# Patient Record
Sex: Male | Born: 1935 | Race: White | Hispanic: No | Marital: Married | Smoking: Former smoker
Health system: Southern US, Community
[De-identification: ages and names within clinical notes are randomized; demographics above are authoritative.]

## PROBLEM LIST (undated history)

## (undated) DIAGNOSIS — K5730 Diverticulosis of large intestine without perforation or abscess without bleeding: Secondary | ICD-10-CM

## (undated) DIAGNOSIS — Z8619 Personal history of other infectious and parasitic diseases: Secondary | ICD-10-CM

## (undated) DIAGNOSIS — I219 Acute myocardial infarction, unspecified: Secondary | ICD-10-CM

## (undated) DIAGNOSIS — I714 Abdominal aortic aneurysm, without rupture, unspecified: Secondary | ICD-10-CM

## (undated) DIAGNOSIS — I509 Heart failure, unspecified: Secondary | ICD-10-CM

## (undated) DIAGNOSIS — I519 Heart disease, unspecified: Secondary | ICD-10-CM

## (undated) DIAGNOSIS — I2510 Atherosclerotic heart disease of native coronary artery without angina pectoris: Secondary | ICD-10-CM

## (undated) DIAGNOSIS — J439 Emphysema, unspecified: Secondary | ICD-10-CM

## (undated) DIAGNOSIS — N400 Enlarged prostate without lower urinary tract symptoms: Secondary | ICD-10-CM

## (undated) DIAGNOSIS — J8410 Pulmonary fibrosis, unspecified: Secondary | ICD-10-CM

## (undated) DIAGNOSIS — R300 Dysuria: Secondary | ICD-10-CM

## (undated) DIAGNOSIS — E785 Hyperlipidemia, unspecified: Secondary | ICD-10-CM

## (undated) DIAGNOSIS — D039 Melanoma in situ, unspecified: Secondary | ICD-10-CM

## (undated) DIAGNOSIS — I10 Essential (primary) hypertension: Secondary | ICD-10-CM

## (undated) HISTORY — DX: Personal history of other infectious and parasitic diseases: Z86.19

## (undated) HISTORY — DX: Benign prostatic hyperplasia without lower urinary tract symptoms: N40.0

## (undated) HISTORY — DX: Melanoma in situ, unspecified: D03.9

## (undated) HISTORY — DX: Emphysema, unspecified: J43.9

## (undated) HISTORY — DX: Abdominal aortic aneurysm, without rupture: I71.4

## (undated) HISTORY — DX: Dysuria: R30.0

## (undated) HISTORY — DX: Acute myocardial infarction, unspecified: I21.9

## (undated) HISTORY — DX: Heart disease, unspecified: I51.9

## (undated) HISTORY — DX: Hyperlipidemia, unspecified: E78.5

## (undated) HISTORY — DX: Abdominal aortic aneurysm, without rupture, unspecified: I71.40

## (undated) HISTORY — DX: Diverticulosis of large intestine without perforation or abscess without bleeding: K57.30

## (undated) HISTORY — DX: Essential (primary) hypertension: I10

## (undated) HISTORY — DX: Heart failure, unspecified: I50.9

## (undated) HISTORY — DX: Atherosclerotic heart disease of native coronary artery without angina pectoris: I25.10

---

## 1989-12-23 HISTORY — PX: CORONARY ARTERY BYPASS GRAFT: SHX141

## 1995-12-24 HISTORY — PX: OTHER SURGICAL HISTORY: SHX169

## 2006-12-23 HISTORY — PX: OTHER SURGICAL HISTORY: SHX169

## 2007-04-05 ENCOUNTER — Encounter: Payer: Self-pay | Admitting: Family Medicine

## 2008-12-22 ENCOUNTER — Encounter: Payer: Self-pay | Admitting: Family Medicine

## 2009-06-27 ENCOUNTER — Encounter: Payer: Self-pay | Admitting: Family Medicine

## 2009-09-01 ENCOUNTER — Encounter: Payer: Self-pay | Admitting: Family Medicine

## 2009-12-23 HISTORY — PX: HERNIA REPAIR: SHX51

## 2009-12-23 HISTORY — PX: OTHER SURGICAL HISTORY: SHX169

## 2010-03-14 ENCOUNTER — Ambulatory Visit: Payer: Self-pay | Admitting: Ophthalmology

## 2010-05-09 ENCOUNTER — Ambulatory Visit: Payer: Self-pay | Admitting: Ophthalmology

## 2010-07-13 ENCOUNTER — Ambulatory Visit: Payer: Self-pay | Admitting: Family Medicine

## 2010-07-13 DIAGNOSIS — E785 Hyperlipidemia, unspecified: Secondary | ICD-10-CM

## 2010-07-13 DIAGNOSIS — I714 Abdominal aortic aneurysm, without rupture, unspecified: Secondary | ICD-10-CM | POA: Insufficient documentation

## 2010-07-13 DIAGNOSIS — I110 Hypertensive heart disease with heart failure: Secondary | ICD-10-CM

## 2010-07-15 ENCOUNTER — Encounter: Payer: Self-pay | Admitting: Family Medicine

## 2010-07-18 DIAGNOSIS — Z87898 Personal history of other specified conditions: Secondary | ICD-10-CM

## 2010-07-18 DIAGNOSIS — I519 Heart disease, unspecified: Secondary | ICD-10-CM

## 2010-07-18 DIAGNOSIS — Z9189 Other specified personal risk factors, not elsewhere classified: Secondary | ICD-10-CM | POA: Insufficient documentation

## 2010-07-19 ENCOUNTER — Encounter: Payer: Self-pay | Admitting: Family Medicine

## 2010-09-20 ENCOUNTER — Encounter: Payer: Self-pay | Admitting: Family Medicine

## 2010-12-06 ENCOUNTER — Encounter: Payer: Self-pay | Admitting: Family Medicine

## 2011-01-22 NOTE — Letter (Signed)
Summary: [facility]  [facility]   Imported By: Lanelle Bal 07/25/2010 08:24:19  _____________________________________________________________________  External Attachment:    Type:   Image     Comment:   External Document

## 2011-01-22 NOTE — Miscellaneous (Signed)
  Clinical Lists Changes  Problems: Removed problem of SEIZURE DISORDER (ICD-780.39) Observations: Added new observation of PAST MED HX: BENIGN PROSTATIC HYPERTROPHY, HX OF (ICD-V13.8)  Dr. Morrie Sheldon CHICKENPOX, HX OF (ICD-V15.9) HEART DISEASE (ICD-429.9) HYPERTENSION (ICD-401.9) ABDOMINAL AORTIC ANEURYSM (ICD-441.4) Dr. Janetta Hora HYPERTENSION (ICD-401.9) HYPERLIPIDEMIA (ICD-272.4) Gout Sigmoid diverticulosis (07/19/2010 21:16) Added new observation of PAST SURG HX: bilateral cataract surgery 2011 AAA stent 2008 R hernia repair Left CEA 1997 CAGB 1991 (07/19/2010 21:16)      Past History:  Past Medical History: BENIGN PROSTATIC HYPERTROPHY, HX OF (ICD-V13.8)  Dr. Morrie Sheldon CHICKENPOX, HX OF (ICD-V15.9) HEART DISEASE (ICD-429.9) HYPERTENSION (ICD-401.9) ABDOMINAL AORTIC ANEURYSM (ICD-441.4) Dr. Janetta Hora HYPERTENSION (ICD-401.9) HYPERLIPIDEMIA (ICD-272.4) Gout Sigmoid diverticulosis  Past Surgical History: bilateral cataract surgery 2011 AAA stent 2008 R hernia repair Left CEA 1997 CAGB 1991     Allergies: No Known Drug Allergies

## 2011-01-22 NOTE — Letter (Signed)
Summary: [facility]  [facility]   Imported By: Maryln Gottron 10/10/2010 15:23:31  _____________________________________________________________________  External Attachment:    Type:   Image     Comment:   External Document  Appended Document: [facility]    Clinical Lists Changes  Observations: Added new observation of PAST MED HX: BENIGN PROSTATIC HYPERTROPHY, HX OF (ICD-V13.8)  Dr. Morrie Sheldon CHICKENPOX, HX OF (ICD-V15.9) HEART DISEASE (ICD-429.9) HYPERTENSION (ICD-401.9) ABDOMINAL AORTIC ANEURYSM (ICD-441.4)  with endoleak per Dr. Janetta Hora HYPERTENSION (ICD-401.9) HYPERLIPIDEMIA (ICD-272.4) Gout Sigmoid diverticulosis (10/10/2010 23:33)       Past History:  Past Medical History: BENIGN PROSTATIC HYPERTROPHY, HX OF (ICD-V13.8)  Dr. Morrie Sheldon CHICKENPOX, HX OF (ICD-V15.9) HEART DISEASE (ICD-429.9) HYPERTENSION (ICD-401.9) ABDOMINAL AORTIC ANEURYSM (ICD-441.4)  with endoleak per Dr. Janetta Hora HYPERTENSION (ICD-401.9) HYPERLIPIDEMIA (ICD-272.4) Gout Sigmoid diverticulosis

## 2011-01-22 NOTE — Miscellaneous (Signed)
  Clinical Lists Changes  Observations: Added new observation of PAST SURG HX: bilateral cataract surgery 2011 AAA stent 2008 R hernia repair (07/15/2010 23:23)      Past History:  Past Surgical History: bilateral cataract surgery 2011 AAA stent 2008 R hernia repair

## 2011-01-22 NOTE — Letter (Signed)
Summary: [facility]  [facility]   Imported By: Lanelle Bal 07/23/2010 09:48:33  _____________________________________________________________________  External Attachment:    Type:   Image     Comment:   External Document

## 2011-01-22 NOTE — Assessment & Plan Note (Signed)
Summary: NEW MEDICARE PT   Vital Signs:  Patient profile:   77 year old male Height:      66 inches Weight:      188 pounds BMI:     30.45 Temp:     97.9 degrees F oral Pulse rate:   68 / minute Pulse rhythm:   regular BP sitting:   122 / 58  (left arm) Cuff size:   regular  Vitals Entered By: Delilah Shan CMA Duncan Dull) (July 13, 2010 2:02 PM) CC: New Patient to Establish  (Medicare)   History of Present Illness: Hypertension:      Using medication without problems or lightheadedness: yes Chest pain with exertion:no Edema:no Short of breath:no Average BPs: controlled. Other issues: followed at [facility] clinic Needs to establish local care as most items have been managed by [facility].  Plans to continue to follow up there for meds and labs.   Preventive Screening-Counseling & Management  Alcohol-Tobacco     Smoking Status: quit  Caffeine-Diet-Exercise     Does Patient Exercise: no      Drug Use:  no.    Current Problems (verified): 1)  Hypertension  (ICD-401.9) 2)  Abdominal Aortic Aneurysm  (ICD-441.4) 3)  Seizure Disorder  (ICD-780.39) 4)  Hypertension  (ICD-401.9) 5)  Hyperlipidemia  (ICD-272.4)  Current Medications (verified): 1)  Diltiazem Hcl Er Beads 300 Mg Xr24h-Cap (Diltiazem Hcl Er Beads) .... Take 1 Tablet By Mouth Once A Day 2)  Triamterene-Hctz 37.5-25 Mg Tabs (Triamterene-Hctz) .... Take 1 Tablet By Mouth Every Morning 3)  Aspirin 81 Mg  Tabs (Aspirin) .... Take 1 Tablet By Mouth Every Morning 4)  Vitamin C 500 Mg  Tabs (Ascorbic Acid) .... Take 1 Tablet By Mouth Every Morning 5)  Vitamin E 600 Unit  Caps (Vitamin E) .... 400 International Units Take 1 Tablet By Mouth Every Morning 6)  Simvastatin 20 Mg Tabs (Simvastatin) .... Take 1 Tab By Mouth At Bedtime 7)  Avodart 0.5 Mg Caps (Dutasteride) .... Take 1 Tab By Mouth At Bedtime 8)  Allopurinol 300 Mg Tabs (Allopurinol) .... Take 1 Tab By Mouth At Bedtime 9)  Doxazosin Mesylate 8 Mg Tabs (Doxazosin Mesylate) ....  Take 1 Tab By Mouth At Bedtime 10)  Docusate Sodium 100 Mg Caps (Docusate Sodium) .... 200 Mg. Take 1 Tab By Mouth At Bedtime  Allergies (verified): No Known Drug Allergies  Past History:  Past Medical History: ABDOMINAL AORTIC ANEURYSM (ICD-441.4) Dr. Janetta Hora HYPERTENSION (ICD-401.9) HYPERLIPIDEMIA (ICD-272.4) BPH- prev seen by Dr. Morrie Sheldon    Past Surgical History: bilateral cataract surgery 2011 AAA stent 2008  Family History: Father: Heart Disease, sudden death, age 73 Mother: Stroke Siblings: none  Social History: Marital Status: Married since 1951 Children: [employer] Occupation: Retired Former Smoker Alcohol use-yes Drug use-no Regular exercise-no 3 children, 1 died of breast CA, another had breast CA Enjoyed travelling with RV previously Smoking Status:  quit Drug Use:  no Does Patient Exercise:  no  Review of Systems       See HPI.  Otherwise noncontributory.  "I'm feeling good."  Physical Exam  General:  GEN: nad, alert and oriented HEENT: mucous membranes moist NECK: supple w/o LA CV: rrr. PULM: ctab, no inc wob ABD: soft, +bs, no bruit, no pulsatile mass EXT: trace edema SKIN: no acute rash    Impression & Recommendations:  Problem # 1:  HYPERTENSION (ICD-401.9) No change in meds.  BP controlled.  will request records and update chart.  He has follow up  for AAA.  No surgery planned at this point.  His updated medication list for this problem includes:    Diltiazem Hcl Er Beads 300 Mg Xr24h-cap (Diltiazem hcl er beads) .Marland Kitchen... Take 1 tablet by mouth once a day    Triamterene-hctz 37.5-25 Mg Tabs (Triamterene-hctz) .Marland Kitchen... Take 1 tablet by mouth every morning    Doxazosin Mesylate 8 Mg Tabs (Doxazosin mesylate) .Marland Kitchen... Take 1 tab by mouth at bedtime  Complete Medication List: 1)  Diltiazem Hcl Er Beads 300 Mg Xr24h-cap (Diltiazem hcl er beads) .... Take 1 tablet by mouth once a day 2)  Triamterene-hctz 37.5-25 Mg Tabs (Triamterene-hctz) .... Take 1 tablet by  mouth every morning 3)  Aspirin 81 Mg Tabs (Aspirin) .... Take 1 tablet by mouth every morning 4)  Vitamin C 500 Mg Tabs (Ascorbic acid) .... Take 1 tablet by mouth every morning 5)  Vitamin E 600 Unit Caps (Vitamin e) .... 400 international units take 1 tablet by mouth every morning 6)  Simvastatin 20 Mg Tabs (Simvastatin) .... Take 1 tab by mouth at bedtime 7)  Avodart 0.5 Mg Caps (Dutasteride) .... Take 1 tab by mouth at bedtime 8)  Allopurinol 300 Mg Tabs (Allopurinol) .... Take 1 tab by mouth at bedtime 9)  Doxazosin Mesylate 8 Mg Tabs (Doxazosin mesylate) .... Take 1 tab by mouth at bedtime 10)  Docusate Sodium 100 Mg Caps (Docusate sodium) .... 200 mg. take 1 tab by mouth at bedtime  Patient Instructions: 1)  Don't change your meds.  We'll get your records.   2)  Please schedule a follow-up appointment as needed .   Current Allergies (reviewed today): No known allergies

## 2011-01-24 NOTE — Letter (Signed)
Summary: [facility]  [facility]   Imported By: Lanelle Bal 12/20/2010 07:56:22  _____________________________________________________________________  External Attachment:    Type:   Image     Comment:   External Document  Appended Document: [facility]    Clinical Lists Changes  Observations: Added new observation of PAST MED HX: BENIGN PROSTATIC HYPERTROPHY, HX OF (ICD-V13.8)  Dr. Morrie Sheldon CHICKENPOX, HX OF (ICD-V15.9) HEART DISEASE (ICD-429.9) HYPERTENSION (ICD-401.9) ABDOMINAL AORTIC ANEURYSM (ICD-441.4)  with endoleak per Dr. Janetta Hora, aneurysmal sac size stable as of 12/11 (7.5x6.1cm) HYPERTENSION (ICD-401.9) HYPERLIPIDEMIA (ICD-272.4) Gout Sigmoid diverticulosis (12/20/2010 8:11)       Past History:  Past Medical History: BENIGN PROSTATIC HYPERTROPHY, HX OF (ICD-V13.8)  Dr. Morrie Sheldon CHICKENPOX, HX OF (ICD-V15.9) HEART DISEASE (ICD-429.9) HYPERTENSION (ICD-401.9) ABDOMINAL AORTIC ANEURYSM (ICD-441.4)  with endoleak per Dr. Janetta Hora, aneurysmal sac size stable as of 12/11 (7.5x6.1cm) HYPERTENSION (ICD-401.9) HYPERLIPIDEMIA (ICD-272.4) Gout Sigmoid diverticulosis

## 2011-01-25 ENCOUNTER — Encounter: Payer: Self-pay | Admitting: Family Medicine

## 2011-01-25 ENCOUNTER — Ambulatory Visit (INDEPENDENT_AMBULATORY_CARE_PROVIDER_SITE_OTHER): Payer: Medicare Other | Admitting: Family Medicine

## 2011-01-25 DIAGNOSIS — R300 Dysuria: Secondary | ICD-10-CM | POA: Insufficient documentation

## 2011-01-25 LAB — CONVERTED CEMR LAB
Nitrite: NEGATIVE
Protein, U semiquant: >=300
WBC Urine, dipstick: NEGATIVE
pH: 7

## 2011-01-30 NOTE — Assessment & Plan Note (Signed)
Summary: ?UTI/CLE    [employer]   Vital Signs:  Patient profile:   77 year old male Height:      66 inches Weight:      186 pounds BMI:     30.13 Temp:     98 degrees F oral Pulse rate:   88 / minute Pulse rhythm:   regular BP sitting:   132 / 60  (left arm) Cuff size:   large  Vitals Entered By: Delilah Shan CMA Caedin Mogan Dull) (January 25, 2011 9:51 AM) CC: ? UTI  - urinating blood   History of Present Illness: Pain with urination since Monday with loss of bladder control.  No fevers.  No symptoms like this previously.  No change in meds.  Has had lower abdominal pain. No blood in stool. No hemoptysis and no bruising.  Burning with urination.  No pain now.  Allergies: No Known Drug Allergies  Review of Systems       See HPI.  Otherwise negative.    Physical Exam  General:  no apparent distress normocephalic atraumatic mucous membranes moist regular rate and rhythm clear to auscultation bilaterally abdomen soft, not tender to palpation, no cva pain, no suprapubic pain rectal w/o gross blood, prostate enlarged but not tender to palpation  ext genitalia: uncircumcised, appears to have fungal elements on the glans.  urethral meatus is slightly irriated.  shaft not tender to palpation    Impression & Recommendations:  Problem # 1:  DYSURIA (ICD-788.1) Treat as presumed cystitis and not prostatis with concurrent superficial fungal infection on the glans.  he has used lotrasone before and this was the only med that helped the patient.  Start cipro and check cx.  Fu as needed.  He agrees.  Nontoxic.  I don't suspect any relation to his vascular disease.  Orders: Prescription Created Electronically [id number]) Specimen Handling ([id number]) T-Culture, Urine ([id number])  His updated medication list for this problem includes:    Cipro 500 Mg Tabs (Ciprofloxacin hcl) .Marland Kitchen... 1 by mouth two times a day x10days  Complete Medication List: 1)  Diltiazem Hcl Er Beads 300 Mg Xr24h-cap (Diltiazem hcl  er beads) .... Take 1 tablet by mouth once a day 2)  Triamterene-hctz 37.5-25 Mg Tabs (Triamterene-hctz) .... Take 1 tablet by mouth every morning 3)  Aspirin 81 Mg Tabs (Aspirin) .... Take 1 tablet by mouth every morning 4)  Vitamin C 500 Mg Tabs (Ascorbic acid) .... Take 1 tablet by mouth every morning 5)  Vitamin E 600 Unit Caps (Vitamin e) .... 400 international units take 1 tablet by mouth every morning 6)  Simvastatin 20 Mg Tabs (Simvastatin) .... Take 1 tab by mouth at bedtime 7)  Avodart 0.5 Mg Caps (Dutasteride) .... Take 1 tab by mouth at bedtime 8)  Allopurinol 300 Mg Tabs (Allopurinol) .... Take 1 tab by mouth at bedtime 9)  Doxazosin Mesylate 8 Mg Tabs (Doxazosin mesylate) .... Take 1 tab by mouth at bedtime 10)  Docusate Sodium 100 Mg Caps (Docusate sodium) .... 200 mg. take 1 tab by mouth at bedtime 11)  Cipro 500 Mg Tabs (Ciprofloxacin hcl) .Marland Kitchen.. 1 by mouth two times a day x10days 12)  Lotrisone 1-0.05 % Crea (Clotrimazole-betamethasone) .... Apply to affected area two times a day as needed.  Other Orders: UA Dipstick w/o Micro (manual) (29562)  Patient Instructions: 1)  Start the antibiotics today and use the cream as needed.  We'll contact you with your lab report.  If your symptoms continue, let us  know.  It looks like you have a bladder infection and not a problem related to the blood vessels.  Take care.  Prescriptions: LOTRISONE 1-0.05 % CREA (CLOTRIMAZOLE-BETAMETHASONE) apply to affected area two times a day as needed.  #30g x 1   Entered and Authorized by:   Crawford Givens MD   Signed by:   Crawford Givens MD on 01/25/2011   Method used:   Electronically to        [facility]  [address] (retail)       [address]       [place], [place]  [address]       Ph: [phone] or [phone]       Fax: [phone]   RxID:   [id number] CIPRO 500 MG TABS (CIPROFLOXACIN HCL) 1 by mouth two times a day x10days  #20 x 0   Entered and Authorized by:   Crawford Givens  MD   Signed by:   Crawford Givens MD on 01/25/2011   Method used:   [facility]        [facility]  [id number] (retail[facility]       [address]       [place], [place]  [address]       Ph: [phone] or [phone]       Fax: [phone]   RxID:   [id number]    Orders Added: 1)  UA Dipstick w/o Micro (manual) [81002] 2)  Prescription Created Electronically [G8553] 3)  Est. Patient Level III [70623] 4)  Specimen Handling [99000] 5)  T-Culture, Urine [[id number]]    Current Allergies (reviewed today): No known allergies   Laboratory Results   Urine Tests  Date/Time Received: January 25, 2011 9:59 AM   Routine Urinalysis   Color: red Appearance: Cloudy Glucose: negative   (Normal Range: Negative) Bilirubin: negative   (Normal Range: Negative) Ketone: negative   (Normal Range: Negative) Spec. Gravity: 1.015   (Normal Range: 1.003-1.035) Blood: large   (Normal Range: Negative) pH: 7.0   (Normal Range: 5.0-8.0) Protein: >=300   (Normal Range: Negative) Urobilinogen: 0.2   (Normal Range: 0-1) Nitrite: negative   (Normal Range: Negative) Leukocyte Esterace: negative   (Normal Range: Negative)

## 2011-04-10 LAB — LIPID PANEL
Cholesterol: 161 mg/dL (ref 0–200)
HDL: 81 mg/dL — AB (ref 35–70)
Triglycerides: 48 mg/dL (ref 40–160)

## 2011-08-01 ENCOUNTER — Encounter: Payer: Self-pay | Admitting: Family Medicine

## 2011-08-28 ENCOUNTER — Encounter: Payer: Self-pay | Admitting: Family Medicine

## 2011-08-29 ENCOUNTER — Encounter: Payer: Self-pay | Admitting: Family Medicine

## 2011-12-09 LAB — BASIC METABOLIC PANEL: Creatinine: 1.1 mg/dL (ref 0.6–1.3)

## 2011-12-09 LAB — LIPID PANEL
HDL: 72 mg/dL — AB (ref 35–70)
LDL Cholesterol: 71 mg/dL
Triglycerides: 46 mg/dL (ref 40–160)

## 2011-12-09 LAB — CBC AND DIFFERENTIAL: Hemoglobin: 14.4 g/dL (ref 13.5–17.5)

## 2012-01-27 ENCOUNTER — Encounter: Payer: Self-pay | Admitting: Family Medicine

## 2012-02-13 ENCOUNTER — Encounter: Payer: Self-pay | Admitting: Family Medicine

## 2012-05-11 ENCOUNTER — Inpatient Hospital Stay (HOSPITAL_COMMUNITY)
Admission: EM | Admit: 2012-05-11 | Discharge: 2012-05-13 | DRG: 203 | Disposition: A | Discharge status: Home / Self Care | Payer: Medicare Other | Attending: Internal Medicine | Admitting: Internal Medicine

## 2012-05-11 ENCOUNTER — Emergency Department (HOSPITAL_COMMUNITY): Payer: Medicare Other

## 2012-05-11 ENCOUNTER — Encounter (HOSPITAL_COMMUNITY): Payer: Self-pay | Admitting: *Deleted

## 2012-05-11 ENCOUNTER — Ambulatory Visit (INDEPENDENT_AMBULATORY_CARE_PROVIDER_SITE_OTHER): Payer: Medicare Other | Admitting: Family Medicine

## 2012-05-11 ENCOUNTER — Encounter: Payer: Self-pay | Admitting: Family Medicine

## 2012-05-11 VITALS — BP 144/64 | HR 87 | Temp 99.5°F | Wt 187.0 lb

## 2012-05-11 DIAGNOSIS — Z7982 Long term (current) use of aspirin: Secondary | ICD-10-CM

## 2012-05-11 DIAGNOSIS — N400 Enlarged prostate without lower urinary tract symptoms: Secondary | ICD-10-CM

## 2012-05-11 DIAGNOSIS — R0902 Hypoxemia: Secondary | ICD-10-CM | POA: Insufficient documentation

## 2012-05-11 DIAGNOSIS — M109 Gout, unspecified: Secondary | ICD-10-CM | POA: Diagnosis present

## 2012-05-11 DIAGNOSIS — N401 Enlarged prostate with lower urinary tract symptoms: Secondary | ICD-10-CM | POA: Diagnosis present

## 2012-05-11 DIAGNOSIS — J189 Pneumonia, unspecified organism: Secondary | ICD-10-CM | POA: Insufficient documentation

## 2012-05-11 DIAGNOSIS — J438 Other emphysema: Secondary | ICD-10-CM | POA: Diagnosis present

## 2012-05-11 DIAGNOSIS — R300 Dysuria: Secondary | ICD-10-CM | POA: Diagnosis present

## 2012-05-11 DIAGNOSIS — Z87891 Personal history of nicotine dependence: Secondary | ICD-10-CM

## 2012-05-11 DIAGNOSIS — J40 Bronchitis, not specified as acute or chronic: Secondary | ICD-10-CM

## 2012-05-11 DIAGNOSIS — Z87898 Personal history of other specified conditions: Secondary | ICD-10-CM | POA: Diagnosis present

## 2012-05-11 DIAGNOSIS — J45909 Unspecified asthma, uncomplicated: Secondary | ICD-10-CM | POA: Diagnosis present

## 2012-05-11 DIAGNOSIS — E785 Hyperlipidemia, unspecified: Secondary | ICD-10-CM | POA: Diagnosis present

## 2012-05-11 DIAGNOSIS — E871 Hypo-osmolality and hyponatremia: Secondary | ICD-10-CM

## 2012-05-11 DIAGNOSIS — I110 Hypertensive heart disease with heart failure: Secondary | ICD-10-CM | POA: Diagnosis present

## 2012-05-11 DIAGNOSIS — I714 Abdominal aortic aneurysm, without rupture, unspecified: Secondary | ICD-10-CM | POA: Diagnosis present

## 2012-05-11 DIAGNOSIS — I10 Essential (primary) hypertension: Secondary | ICD-10-CM | POA: Diagnosis present

## 2012-05-11 DIAGNOSIS — I25810 Atherosclerosis of coronary artery bypass graft(s) without angina pectoris: Secondary | ICD-10-CM

## 2012-05-11 DIAGNOSIS — I519 Heart disease, unspecified: Secondary | ICD-10-CM | POA: Diagnosis present

## 2012-05-11 DIAGNOSIS — Z951 Presence of aortocoronary bypass graft: Secondary | ICD-10-CM

## 2012-05-11 DIAGNOSIS — I2510 Atherosclerotic heart disease of native coronary artery without angina pectoris: Secondary | ICD-10-CM | POA: Diagnosis present

## 2012-05-11 DIAGNOSIS — K5730 Diverticulosis of large intestine without perforation or abscess without bleeding: Secondary | ICD-10-CM | POA: Diagnosis present

## 2012-05-11 DIAGNOSIS — J209 Acute bronchitis, unspecified: Principal | ICD-10-CM | POA: Diagnosis present

## 2012-05-11 DIAGNOSIS — Z79899 Other long term (current) drug therapy: Secondary | ICD-10-CM

## 2012-05-11 DIAGNOSIS — N138 Other obstructive and reflux uropathy: Secondary | ICD-10-CM | POA: Diagnosis present

## 2012-05-11 LAB — COMPREHENSIVE METABOLIC PANEL
ALT: 12 U/L (ref 0–53)
AST: 21 U/L (ref 0–37)
Albumin: 3.9 g/dL (ref 3.5–5.2)
Alkaline Phosphatase: 64 U/L (ref 39–117)
CO2: 25 mEq/L (ref 19–32)
Chloride: 99 mEq/L (ref 96–112)
GFR calc non Af Amer: 67 mL/min — ABNORMAL LOW (ref >90)
Potassium: 4.1 mEq/L (ref 3.5–5.1)
Total Bilirubin: 0.5 mg/dL (ref 0.3–1.2)

## 2012-05-11 LAB — CBC
HCT: 42.4 % (ref 39.0–52.0)
Hemoglobin: 14.2 g/dL (ref 13.0–17.0)
MCHC: 33.5 g/dL (ref 30.0–36.0)
RDW: 15 % (ref 11.5–15.5)
WBC: 12 10*3/uL — ABNORMAL HIGH (ref 4.0–10.5)

## 2012-05-11 LAB — DIFFERENTIAL
Basophils Absolute: 0 10*3/uL (ref 0.0–0.1)
Basophils Relative: 0 % (ref 0–1)
Lymphocytes Relative: 7 % — ABNORMAL LOW (ref 12–46)
Monocytes Absolute: 0.9 10*3/uL (ref 0.1–1.0)
Neutro Abs: 10 10*3/uL — ABNORMAL HIGH (ref 1.7–7.7)
Neutrophils Relative %: 83 % — ABNORMAL HIGH (ref 43–77)

## 2012-05-11 LAB — CARDIAC PANEL(CRET KIN+CKTOT+MB+TROPI): Relative Index: 0.6 (ref 0.0–2.5)

## 2012-05-11 LAB — D-DIMER, QUANTITATIVE: D-Dimer, Quant: 3.63 ug/mL-FEU — ABNORMAL HIGH (ref 0.00–0.48)

## 2012-05-11 MED ORDER — ASPIRIN 81 MG PO CHEW
81.0000 mg | CHEWABLE_TABLET | Freq: Every day | ORAL | Status: DC
  Administered 2012-05-12 – 2012-05-13 (×2): 81 mg via ORAL
  Filled 2012-05-11 (×2): qty 1

## 2012-05-11 MED ORDER — DUTASTERIDE 0.5 MG PO CAPS
0.5000 mg | ORAL_CAPSULE | Freq: Every day | ORAL | Status: DC
  Administered 2012-05-11 – 2012-05-13 (×3): 0.5 mg via ORAL
  Filled 2012-05-11 (×3): qty 1

## 2012-05-11 MED ORDER — TRIAMTERENE-HCTZ 75-50 MG PO TABS
0.5000 | ORAL_TABLET | Freq: Every day | ORAL | Status: DC
  Administered 2012-05-11 – 2012-05-12 (×2): 0.5 via ORAL
  Filled 2012-05-11 (×3): qty 0.5

## 2012-05-11 MED ORDER — LEVOFLOXACIN 750 MG PO TABS
750.0000 mg | ORAL_TABLET | ORAL | Status: DC
  Filled 2012-05-11: qty 1
  Filled 2012-05-11: qty 2

## 2012-05-11 MED ORDER — SIMVASTATIN 40 MG PO TABS: 40.0000 mg | ORAL_TABLET | Freq: Every evening | ORAL | Status: DC

## 2012-05-11 MED ORDER — DM-GUAIFENESIN ER 30-600 MG PO TB12
1.0000 | ORAL_TABLET | Freq: Two times a day (BID) | ORAL | Status: DC
  Administered 2012-05-11 – 2012-05-13 (×4): 1 via ORAL
  Filled 2012-05-11 (×5): qty 1

## 2012-05-11 MED ORDER — ASPIRIN 81 MG PO TABS: 81.0000 mg | ORAL_TABLET | Freq: Every day | ORAL | Status: DC

## 2012-05-11 MED ORDER — HEPARIN SODIUM (PORCINE) 5000 UNIT/ML IJ SOLN
5000.0000 [IU] | Freq: Three times a day (TID) | INTRAMUSCULAR | Status: DC
  Administered 2012-05-11 – 2012-05-13 (×5): 5000 [IU] via SUBCUTANEOUS
  Filled 2012-05-11 (×8): qty 1

## 2012-05-11 MED ORDER — IOHEXOL 300 MG/ML  SOLN
100.0000 mL | Freq: Once | INTRAMUSCULAR | Status: AC | PRN
  Administered 2012-05-11: 80 mL via INTRAVENOUS

## 2012-05-11 MED ORDER — ATORVASTATIN CALCIUM 20 MG PO TABS
20.0000 mg | ORAL_TABLET | Freq: Every day | ORAL | Status: DC
  Administered 2012-05-11 – 2012-05-12 (×2): 20 mg via ORAL
  Filled 2012-05-11 (×3): qty 1

## 2012-05-11 MED ORDER — ACETAMINOPHEN 500 MG PO TABS
1000.0000 mg | ORAL_TABLET | Freq: Once | ORAL | Status: AC
  Administered 2012-05-11: 1000 mg via ORAL
  Filled 2012-05-11: qty 2

## 2012-05-11 MED ORDER — DOXAZOSIN MESYLATE 8 MG PO TABS
8.0000 mg | ORAL_TABLET | Freq: Every day | ORAL | Status: DC
  Administered 2012-05-11 – 2012-05-12 (×2): 8 mg via ORAL
  Filled 2012-05-11 (×3): qty 1

## 2012-05-11 MED ORDER — ALLOPURINOL 300 MG PO TABS
300.0000 mg | ORAL_TABLET | Freq: Every day | ORAL | Status: DC
  Administered 2012-05-11 – 2012-05-13 (×3): 300 mg via ORAL
  Filled 2012-05-11 (×3): qty 1

## 2012-05-11 MED ORDER — LEVOFLOXACIN 500 MG PO TABS: 750.0000 mg | ORAL_TABLET | ORAL | Status: DC

## 2012-05-11 MED ORDER — DOCUSATE SODIUM 100 MG PO CAPS
100.0000 mg | ORAL_CAPSULE | Freq: Two times a day (BID) | ORAL | Status: DC
  Administered 2012-05-11 – 2012-05-12 (×2): 100 mg via ORAL
  Filled 2012-05-11 (×5): qty 1

## 2012-05-11 MED ORDER — MOXIFLOXACIN HCL 400 MG PO TABS
400.0000 mg | ORAL_TABLET | Freq: Once | ORAL | Status: AC
  Administered 2012-05-11: 400 mg via ORAL
  Filled 2012-05-11: qty 1

## 2012-05-11 MED ORDER — VITAMIN D3 25 MCG (1000 UNIT) PO TABS
1000.0000 [IU] | ORAL_TABLET | Freq: Every day | ORAL | Status: DC
  Administered 2012-05-11 – 2012-05-13 (×3): 1000 [IU] via ORAL
  Filled 2012-05-11 (×3): qty 1

## 2012-05-11 MED ORDER — DILTIAZEM HCL ER BEADS 300 MG PO CP24
300.0000 mg | ORAL_CAPSULE | Freq: Every day | ORAL | Status: DC
  Administered 2012-05-11 – 2012-05-13 (×3): 300 mg via ORAL
  Filled 2012-05-11 (×3): qty 1

## 2012-05-11 NOTE — Progress Notes (Signed)
PHARMACIST - PHYSICIAN COMMUNICATION  DESCRIPTION:   Patients on diltiazem and simvastatin >10mg /day have reported cases of rhabdomyolysis. Per P/T Policy, if simvastatin dose is > 10 mg, substitute atorvastatin (Lipitor) 1mg  for each 2mg  simvastatin and leave communication form advising MD of change.  Please take this into consideration at discharge.  Thank you.    If you have questions about this conversion, please contact the Pharmacy Department  []   [phone] )  [facility] []   [phone] )  [facility]  []   [phone] )  [facility] [x]   [phone] )  [facility]    Geoffry Paradise, PharmD.   Pager:  [phone] 10:05 PM

## 2012-05-11 NOTE — Progress Notes (Signed)
3 days of ST (this is some better now) and cough.  The cough is worse.  Cough is worse supine.  Sleep is disrupted.  Mild elevation in temp today, hadn't checked at home.  Had no sweats or chills.  Is sob, more than normal but he can get a good deep breath.  He can walk 20 feet and then gets winded, shorter than normal.  No wheeze.  No sputum.  Diffuse aches and pain in forehead.    PMH and SH reviewed  ROS: See HPI, otherwise noncontributory.  Meds, vitals, and allergies reviewed.   nad ncat  Tm w/o erthema OP wnl Neck supple rrr No focal dec in BS but global dec in BS w/o wheeze Abd soft  Ext w/o edema  Pulse ox initially ~90%, improved to 95% with 2L [place] and he feels less SOB

## 2012-05-11 NOTE — ED Notes (Signed)
[id number]<AV> Expected date:<BR> Expected time: 2:04 PM<BR> Means of arrival:<BR> Comments:<BR> M51 - 82yoM Chest congestion, febrile, rales

## 2012-05-11 NOTE — Patient Instructions (Signed)
To ER

## 2012-05-11 NOTE — Progress Notes (Signed)
ANTIBIOTIC CONSULT NOTE - INITIAL  Pharmacy Consult for Levaquin Indication: possible PNA  No Known Allergies  Patient Measurements:   Adjusted Body Weight:   Vital Signs: Temp: 99 F (37.2 C) (05/20 2043) Temp src: Oral (05/20 2043) BP: 134/57 mmHg (05/20 2043) Pulse Rate: 57  (05/20 2043) Intake/Output from previous day:   Intake/Output from this shift:    Labs:  Basename 05/11/12 1612  WBC 12.0*  HGB 14.2  PLT 149*  LABCREA --  CREATININE 1.01   The CrCl is unknown because both a height and weight (above a minimum accepted value) are required for this calculation. No results found for this basename: VANCOTROUGH:2,VANCOPEAK:2,VANCORANDOM:2,GENTTROUGH:2,GENTPEAK:2,GENTRANDOM:2,TOBRATROUGH:2,TOBRAPEAK:2,TOBRARND:2,AMIKACINPEAK:2,AMIKACINTROU:2,AMIKACIN:2, in the last 72 hours   Microbiology: No results found for this or any previous visit (from the past 720 hour(s)).  Medical History: Past Medical History  Diagnosis Date  . Hyperlipidemia   . Hypertension   . Abdominal aortic aneurysm     with endoleak per Dr. Janetta Hora, aneurysmal sac size stable as of 12/11 (7.5 x 6.1cm)  . Benign prostatic hypertrophy   . History of chickenpox   . Dysuria   . Heart disease   . Gout   . Sigmoid diverticulosis    Assessment:  18 YOM presented with sore throat, cough x couple days. Per MD, likely early bacterial pneumonia vs viral bronchitis.  CXR negative.  Levaquin started and pharmacy asked to manage dosing.   Scr 1.01  Patient received Moxifloxacin 400 mg tablet at 2138 tonight  Goal of Therapy:   Appropriate renal adjustment of Levaquin  Plan:   Continue Levaquin 750 mg x 5 days as ordered by MD (will reschedule to begin tomorrow 5/21)  Geoffry Paradise Thi 05/11/2012,10:06 PM

## 2012-05-11 NOTE — H&P (Signed)
History and Physical  Dustin [name] WGN:[id number] DOB: February 09, 1926 DOA: 05/11/2012  Referring physician: PCP: Crawford Givens, MD, MD   Chief Complaint: Cough  HPI:  Bad sore throat started on Firday and worsened till Sunday and coughed all ngiht until Monday morning.  Went to see Dr. Cheree Ditto his PCP today-Was sent for a cT scan of the lungs to rule out PE which was negative.  Has a terrible  CP when he rdid cough.  NO real fevers noted or documented, but was documented to 98.1 F at [facility].    No production of sputum or phlegm, no sick contacts. No h/o asthma or COPd or Allergies-no usual seasonal allergies either.  No bodyaches or pains.  apparentrly looked ashen grey to his PCP this am  Chart Review:  See below  Review of Systems:  Headache this am +-Tylenol here helped, no blurred double vision, no throat or ear congestion right now, and the sore throat seemd to have gone away-no sick kids around, no current CP,-unless he is coughin,no abd pain, +dark stool occasionally-no hemorrhopids,  No one sided weakness, No faints or dizzyness, no blood or burning in urine--has howveer been getting pain when he urinates-can empty bladdr completely  Past Medical History  Diagnosis Date  . Hyperlipidemia   . Hypertension   . Abdominal aortic aneurysm     with endoleak per Dr. Janetta Hora, aneurysmal sac size stable as of 12/11 (7.5 x 6.1cm)  . Benign prostatic hypertrophy   . History of chickenpox   . Dysuria   . Heart disease   . Gout   . Sigmoid diverticulosis   Bypass surgery when he was 78 yrs old-Triple bypass done in [place] at [facility]-Dr. Lequita Halt Stent placed into AAA and last scan in Jan showed no changes and is tosee Dr. Janetta Hora in 1 yr Colonoscopy done 10 yrs ago-no polyps or anything per pt report  Past Surgical History  Procedure Date  . Hernia repair 2011    Right  . Coronary artery bypass graft 1991  . Bilateral cataract surgery 2011  . Aaa stent 2008  . Left cea 1997     Social History:  reports that he quit smoking about 23 years ago. His smoking use included Cigarettes. He has a 40 pack-year smoking history. He does not have any smokeless tobacco history on file. He reports that he drinks alcohol. He reports that he does not use illicit drugs.  No Known Allergies  Family History  Problem Relation Age of Onset  . Stroke Mother   . Heart disease Father 40    Sudden death     Prior to Admission medications   Medication Sig Start Date End Date Taking? Authorizing Provider  allopurinol (ZYLOPRIM) 300 MG tablet Take 300 mg by mouth daily.   Yes Historical Provider, MD  aspirin 81 MG tablet Take 81 mg by mouth daily.   Yes Historical Provider, MD  cholecalciferol (VITAMIN D) 1000 UNITS tablet Take 1,000 Units by mouth daily.   Yes Historical Provider, MD  diltiazem (TIAZAC) 300 MG 24 hr capsule Take 300 mg by mouth daily.   Yes Historical Provider, MD  docusate sodium (COLACE) 100 MG capsule Take 100 mg by mouth 2 (two) times daily.   Yes Historical Provider, MD  doxazosin (CARDURA) 8 MG tablet Take 8 mg by mouth at bedtime.   Yes Historical Provider, MD  dutasteride (AVODART) 0.5 MG capsule Take 0.5 mg by mouth daily.   Yes Historical Provider, MD  simvastatin (  ZOCOR) 40 MG tablet Take 40 mg by mouth every evening.   Yes Historical Provider, MD  triamterene-hydrochlorothiazide (MAXZIDE) 75-50 MG per tablet Take 0.5 tablets by mouth daily.   Yes Historical Provider, MD  vitamin C (ASCORBIC ACID) 500 MG tablet Take 1,000 mg by mouth daily.    Yes Historical Provider, MD  vitamin E 400 UNIT capsule Take 400 Units by mouth daily.   Yes Historical Provider, MD   Physical Exam: Filed Vitals:   05/11/12 1431 05/11/12 1551 05/11/12 1826 05/11/12 2043  BP: 155/56 134/52  134/57  Pulse: 64 63  57  Temp: 99.2 F (37.3 C) 99.5 F (37.5 C)  99 F (37.2 C)  TempSrc: Oral Oral  Oral  Resp: 20 20  15   SpO2: 97% 97% 87% 94%     General:  Pleasant alert  Caucasian in no apparent distress. Patient able to verbalize in full sentences  Eyes: Postop changes to both eyes for cataracts, arcus senilis present, no icterus or pallor  ENT: Soft supple no masses no thyromegaly. No JVD  Neck: See above  Cardiovascular: S1-S2 no murmur rub or gallop  Respiratory: Clinically clear no added sound, no tactile vocal resonance and fremitus  Abdomen: Soft nontender nondistended  Skin: Trace lower extremity edema  Musculoskeletal: Moves all 4 limbs equally  Psychiatric: Euthymic  Neurologic: Grossly intact as  Labs on Admission:  Basic Metabolic Panel:  Lab 05/11/12 5621  NA 137  K 4.1  CL 99  CO2 25  GLUCOSE 102*  BUN 22  CREATININE 1.01  CALCIUM 9.1  MG --  PHOS --    Liver Function Tests:  Lab 05/11/12 1612  AST 21  ALT 12  ALKPHOS 64  BILITOT 0.5  PROT 7.1  ALBUMIN 3.9   No results found for this basename: LIPASE:5,AMYLASE:5 in the last 168 hours No results found for this basename: AMMONIA:5 in the last 168 hours  CBC:  Lab 05/11/12 1612  WBC 12.0*  NEUTROABS 10.0*  HGB 14.2  HCT 42.4  MCV 84.8  PLT 149*    Cardiac Enzymes:  Lab 05/11/12 1612  CKTOTAL 451*  CKMB 2.7  CKMBINDEX --  TROPONINI <0.30    Troponin (Point of Care Test) No results found for this basename: TROPIPOC in the last 72 hours  BNP (last 3 results)  Basename 05/11/12 1612  PROBNP 352.7    CBG: No results found for this basename: GLUCAP:5 in the last 168 hours   Radiological Exams on Admission: Dg Chest 2 View  05/11/2012  *RADIOLOGY REPORT*  Clinical Data: Cough and short of breath  CHEST - 2 VIEW  Comparison: None.  Findings: Normal heart size.  Clear lungs.  No pneumothorax and no pleural effusion.  Right-sided rib deformities are chronic.  No definite acute bony deformity.  Osteopenic spine.  IMPRESSION: No active cardiopulmonary disease.  Original Report Authenticated By: Donavan Burnet, M.D.   Ct Angio Chest W/cm &/or Wo  Cm  05/11/2012  *RADIOLOGY REPORT*  Clinical Data: Hypoxia.  Elevated D-dimer.  Cough.  Exertional shortness of breath.  Sore throat. "Ashen" and 90% on room air, 97% on 2 liters.  CT ANGIOGRAPHY CHEST  Technique:  Multidetector CT imaging of the chest using the standard protocol during bolus administration of intravenous contrast. Multiplanar reconstructed images including MIPs were obtained and reviewed to evaluate the vascular anatomy.  Contrast: 80mL OMNIPAQUE IOHEXOL 300 MG/ML  SOLN  Comparison: Chest x-ray 05/11/2012  Findings: The pulmonary arteries are well opacified.  There is no evidence for acute pulmonary embolus.  The patient has had median sternotomy and CABG.  Heart size is mildly enlarged.  There is atherosclerotic calcification of the thoracic aorta.  There are emphysematous changes.  No focal consolidations or pleural effusions are identified. The visualized portion of the thyroid gland has a normal appearance.  No mediastinal, hilar, or axillary adenopathy.  Images of the upper abdomen are unremarkable.  IMPRESSION:  1.  Technically adequate exam showing no evidence for acute pulmonary embolus. 2.  Postoperative changes. 3.  Cardiomegaly without evidence for pulmonary edema or focal pulmonary abnormality. 4.  Emphysematous changes.  Original Report Authenticated By: Patterson Hammersmith, M.D.    EKG: Independently reviewed. Sinus rhythm 60 per minute, PR interval 0.12, QRS axis 70, intraventricular conduction delay with no ST-T wave changes   Active Problems:  * No active hospital problems. *     Assessment/Plan 1. Likely early bacterial pneumonia versus viral bronchitis-chest x-ray shows no findings and CT angiogram chest done as d-dimer was 3.6 showed only emphysema changes-we will keep patient on Levaquin 750 mg IV as he has not been exposed to any health care facility recently and likely wean antibiotics to by mouth soon. He has an oxygen requirement which is new for  him--Iexplained to him that this may result from bronchitis superimposed on chronic emphysema which is probably had for while. We will get O2 sats walking and without oxygen to determine O2 needs. 2. History of CAD-stable-continue aspirin 81 daily 3. History of triple aortic aneurysm-followup with regular physician for this in one year 4. History of gout-stable-continue allopurinol 300 mg daily 5. Hyperlipidemia-stable, continue Zocor 40 mg every afternoon 6. BPH + some mild dysuria-would get a urinalysis and clean catch.  Levaquin should cover most urinary pathogens but this seems a chronic issue regardless continue Avodart  7. Hypertension-continue Maxzide, diltiazem 300 mg every 24   Obs to team #5, Dr. Ashley Royalty  Code Status:  full Family Communication:  none bedside  Disposition Plan:  Obs  Pleas Koch, MD [facility] ([phone]   If 8PM-8AM, please contact floor/night-coverage at [email or web address], password [facility] 05/11/2012, 9:13 PM

## 2012-05-11 NOTE — ED Provider Notes (Addendum)
History     CSN: [id number]  Arrival date & time 05/11/12  1412   First MD Initiated Contact with Patient 05/11/12 1503      Chief Complaint  Patient presents with  . Cough  . Shortness of Breath    (Consider location/radiation/quality/duration/timing/severity/associated sxs/prior treatment) HPI Comments: Pt with worsening cough and congestion.  Has bifrontal headache.  No dizziness, +generalized weakness and SOB on exertion.  Seen by PCP who found pt to have low oxygen sats, sent over here, sat was 90% on RA.  Leg edema at baseline.  No calf pain. Had episode of CP with coughing spell yesterday, none today.  Patient is a 78 y.o. male presenting with cough and shortness of breath. The history is provided by the patient.  Cough This is a new problem. The current episode started more than 2 days ago. The problem occurs constantly. The problem has been gradually worsening. The cough is non-productive. The maximum temperature recorded prior to his arrival was 100 to 100.9 F. The fever has been present for 1 to 2 days. Associated symptoms include chills, headaches, rhinorrhea and shortness of breath. Pertinent negatives include no chest pain and no sweats. He is not a smoker. His past medical history does not include COPD or asthma.  Shortness of Breath  Associated symptoms include a fever, rhinorrhea, cough and shortness of breath. Pertinent negatives include no chest pain. His past medical history does not include asthma.    Past Medical History  Diagnosis Date  . Hyperlipidemia   . Hypertension   . Abdominal aortic aneurysm     with endoleak per Dr. Janetta Hora, aneurysmal sac size stable as of 12/11 (7.5 x 6.1cm)  . Benign prostatic hypertrophy   . History of chickenpox   . Dysuria   . Heart disease   . Gout   . Sigmoid diverticulosis     Past Surgical History  Procedure Date  . Hernia repair 2011    Right  . Coronary artery bypass graft 1991  . Bilateral cataract surgery 2011    . Aaa stent 2008  . Left cea 1997    Family History  Problem Relation Age of Onset  . Stroke Mother   . Heart disease Father 73    Sudden death    History  Substance Use Topics  . Smoking status: Former Smoker -- 1.0 packs/day for 40 years    Types: Cigarettes    Quit date: 12/23/1988  . Smokeless tobacco: Not on file  . Alcohol Use: Yes      Review of Systems  Constitutional: Positive for fever, chills and fatigue. Negative for diaphoresis.  HENT: Positive for congestion, rhinorrhea, sneezing and postnasal drip.   Eyes: Negative.   Respiratory: Positive for cough and shortness of breath. Negative for chest tightness.   Cardiovascular: Negative for chest pain and leg swelling.  Gastrointestinal: Negative for nausea, vomiting, abdominal pain, diarrhea and blood in stool.  Genitourinary: Negative for frequency, hematuria, flank pain and difficulty urinating.  Musculoskeletal: Negative for back pain and arthralgias.  Skin: Negative for rash.  Neurological: Positive for weakness and headaches. Negative for dizziness, speech difficulty and numbness.    Allergies  Review of patient's allergies indicates no known allergies.  Home Medications   Current Outpatient Rx  Name Route Sig Dispense Refill  . ALLOPURINOL 300 MG PO TABS Oral Take 300 mg by mouth daily.    . ASPIRIN 81 MG PO TABS Oral Take 81 mg by mouth daily.    Marland Kitchen  VITAMIN D 1000 UNITS PO TABS Oral Take 1,000 Units by mouth daily.    Marland Kitchen DILTIAZEM HCL ER BEADS 300 MG PO CP24 Oral Take 300 mg by mouth daily.    Marland Kitchen DOCUSATE SODIUM 100 MG PO CAPS Oral Take 100 mg by mouth 2 (two) times daily.    Marland Kitchen DOXAZOSIN MESYLATE 8 MG PO TABS Oral Take 8 mg by mouth at bedtime.    . DUTASTERIDE 0.5 MG PO CAPS Oral Take 0.5 mg by mouth daily.    Marland Kitchen SIMVASTATIN 40 MG PO TABS Oral Take 40 mg by mouth every evening.    . TRIAMTERENE-HCTZ 75-50 MG PO TABS Oral Take 0.5 tablets by mouth daily.    Marland Kitchen VITAMIN C 500 MG PO TABS Oral Take 1,000 mg  by mouth daily.     Marland Kitchen VITAMIN E 400 UNITS PO CAPS Oral Take 400 Units by mouth daily.      BP 134/57  Pulse 57  Temp(Src) 99 F (37.2 C) (Oral)  Resp 15  SpO2 94%  Physical Exam  Constitutional: He is oriented to person, place, and time. He appears well-developed and well-nourished.  HENT:  Head: Normocephalic and atraumatic.  Eyes: Pupils are equal, round, and reactive to light.  Neck: Normal range of motion. Neck supple.  Cardiovascular: Normal rate, regular rhythm and normal heart sounds.   Pulmonary/Chest: Effort normal. No respiratory distress. He has no wheezes. He has no rales. He exhibits no tenderness.       Decreased breath sounds bilaterally  Abdominal: Soft. Bowel sounds are normal. There is no tenderness. There is no rebound and no guarding.  Musculoskeletal: Normal range of motion. He exhibits edema. He exhibits no tenderness.  Lymphadenopathy:    He has no cervical adenopathy.  Neurological: He is alert and oriented to person, place, and time.  Skin: Skin is warm and dry. No rash noted.  Psychiatric: He has a normal mood and affect.    ED Course  Procedures (including critical care time)  Results for orders placed during the hospital encounter of 05/11/12  CBC      Component Value Range   WBC 12.0 (*) 4.0 - 10.5 (K/uL)   RBC 5.00  4.22 - 5.81 (MIL/uL)   Hemoglobin 14.2  13.0 - 17.0 (g/dL)   HCT 04.5  40.9 - 81.1 (%)   MCV 84.8  78.0 - 100.0 (fL)   MCH 28.4  26.0 - 34.0 (pg)   MCHC 33.5  30.0 - 36.0 (g/dL)   RDW 91.4  78.2 - 95.6 (%)   Platelets 149 (*) 150 - 400 (K/uL)  DIFFERENTIAL      Component Value Range   Neutrophils Relative 83 (*) 43 - 77 (%)   Neutro Abs 10.0 (*) 1.7 - 7.7 (K/uL)   Lymphocytes Relative 7 (*) 12 - 46 (%)   Lymphs Abs 0.9  0.7 - 4.0 (K/uL)   Monocytes Relative 8  3 - 12 (%)   Monocytes Absolute 0.9  0.1 - 1.0 (K/uL)   Eosinophils Relative 1  0 - 5 (%)   Eosinophils Absolute 0.1  0.0 - 0.7 (K/uL)   Basophils Relative 0  0 - 1  (%)   Basophils Absolute 0.0  0.0 - 0.1 (K/uL)  COMPREHENSIVE METABOLIC PANEL      Component Value Range   Sodium 137  135 - 145 (mEq/L)   Potassium 4.1  3.5 - 5.1 (mEq/L)   Chloride 99  96 - 112 (mEq/L)   CO2 25  19 - 32 (mEq/L)   Glucose, Bld 102 (*) 70 - 99 (mg/dL)   BUN 22  6 - 23 (mg/dL)   Creatinine, Ser 1.61  0.50 - 1.35 (mg/dL)   Calcium 9.1  8.4 - 09.6 (mg/dL)   Total Protein 7.1  6.0 - 8.3 (g/dL)   Albumin 3.9  3.5 - 5.2 (g/dL)   AST 21  0 - 37 (U/L)   ALT 12  0 - 53 (U/L)   Alkaline Phosphatase 64  39 - 117 (U/L)   Total Bilirubin 0.5  0.3 - 1.2 (mg/dL)   GFR calc non Af Amer 67 (*) >90 (mL/min)   GFR calc Af Amer 77 (*) >90 (mL/min)  PRO B NATRIURETIC PEPTIDE      Component Value Range   Pro B Natriuretic peptide (BNP) 352.7  0 - 450 (pg/mL)  CARDIAC PANEL(CRET KIN+CKTOT+MB+TROPI)      Component Value Range   Total CK 451 (*) 7 - 232 (U/L)   CK, MB 2.7  0.3 - 4.0 (ng/mL)   Troponin I <0.30  <0.30 (ng/mL)   Relative Index 0.6  0.0 - 2.5   D-DIMER, QUANTITATIVE      Component Value Range   D-Dimer, Quant 3.63 (*) 0.00 - 0.48 (ug/mL-FEU)   Dg Chest 2 View  05/11/2012  *RADIOLOGY REPORT*  Clinical Data: Cough and short of breath  CHEST - 2 VIEW  Comparison: None.  Findings: Normal heart size.  Clear lungs.  No pneumothorax and no pleural effusion.  Right-sided rib deformities are chronic.  No definite acute bony deformity.  Osteopenic spine.  IMPRESSION: No active cardiopulmonary disease.  Original Report Authenticated By: Donavan Burnet, M.D.   Ct Angio Chest W/cm &/or Wo Cm  05/11/2012  *RADIOLOGY REPORT*  Clinical Data: Hypoxia.  Elevated D-dimer.  Cough.  Exertional shortness of breath.  Sore throat. "Ashen" and 90% on room air, 97% on 2 liters.  CT ANGIOGRAPHY CHEST  Technique:  Multidetector CT imaging of the chest using the standard protocol during bolus administration of intravenous contrast. Multiplanar reconstructed images including MIPs were obtained and  reviewed to evaluate the vascular anatomy.  Contrast: 80mL OMNIPAQUE IOHEXOL 300 MG/ML  SOLN  Comparison: Chest x-ray 05/11/2012  Findings: The pulmonary arteries are well opacified.  There is no evidence for acute pulmonary embolus.  The patient has had median sternotomy and CABG.  Heart size is mildly enlarged.  There is atherosclerotic calcification of the thoracic aorta.  There are emphysematous changes.  No focal consolidations or pleural effusions are identified. The visualized portion of the thyroid gland has a normal appearance.  No mediastinal, hilar, or axillary adenopathy.  Images of the upper abdomen are unremarkable.  IMPRESSION:  1.  Technically adequate exam showing no evidence for acute pulmonary embolus. 2.  Postoperative changes. 3.  Cardiomegaly without evidence for pulmonary edema or focal pulmonary abnormality. 4.  Emphysematous changes.  Original Report Authenticated By: Patterson Hammersmith, M.D.    Date: 05/11/2012  Rate: 66  Rhythm: normal sinus rhythm  QRS Axis: normal  Intervals: normal  ST/T Wave abnormalities: nonspecific ST/T changes  Conduction Disutrbances:none  Narrative Interpretation:   Old EKG Reviewed: none available    1. Bronchitis       MDM  Pt was still hypoxic on amublation.  No evidence of pneumonia/PE.  Likely bronchitis.  Discussed with hospitalist who will admit pt        Rolan Bucco, MD 05/11/12 2111  Rolan Bucco, MD 05/11/12 2149

## 2012-05-11 NOTE — ED Notes (Signed)
Per EMS pt brought pt in from [facility] pcp office. Pt c/o non-productive cough, exertional SOB and sore throat. PCP reports pt was "ashen 90% RA, 97% 2L"

## 2012-05-12 ENCOUNTER — Observation Stay (HOSPITAL_COMMUNITY): Payer: Medicare Other

## 2012-05-12 DIAGNOSIS — I10 Essential (primary) hypertension: Secondary | ICD-10-CM

## 2012-05-12 DIAGNOSIS — E871 Hypo-osmolality and hyponatremia: Secondary | ICD-10-CM

## 2012-05-12 DIAGNOSIS — R300 Dysuria: Secondary | ICD-10-CM

## 2012-05-12 DIAGNOSIS — J209 Acute bronchitis, unspecified: Principal | ICD-10-CM | POA: Diagnosis present

## 2012-05-12 LAB — DIFFERENTIAL
Basophils Absolute: 0 10*3/uL (ref 0.0–0.1)
Basophils Relative: 0 % (ref 0–1)
Eosinophils Absolute: 0.2 10*3/uL (ref 0.0–0.7)
Eosinophils Relative: 3 % (ref 0–5)

## 2012-05-12 LAB — LEGIONELLA ANTIGEN, URINE: Legionella Antigen, Urine: NEGATIVE

## 2012-05-12 LAB — COMPREHENSIVE METABOLIC PANEL
ALT: 11 U/L (ref 0–53)
AST: 18 U/L (ref 0–37)
Calcium: 8.8 mg/dL (ref 8.4–10.5)
GFR calc Af Amer: 62 mL/min — ABNORMAL LOW (ref >90)
Glucose, Bld: 91 mg/dL (ref 70–99)
Sodium: 135 mEq/L (ref 135–145)
Total Protein: 5.8 g/dL — ABNORMAL LOW (ref 6.0–8.3)

## 2012-05-12 LAB — CBC
MCH: 28.3 pg (ref 26.0–34.0)
MCHC: 33 g/dL (ref 30.0–36.0)
MCV: 85.8 fL (ref 78.0–100.0)
Platelets: 125 10*3/uL — ABNORMAL LOW (ref 150–400)
RDW: 15.2 % (ref 11.5–15.5)

## 2012-05-12 MED ORDER — METHYLPREDNISOLONE SODIUM SUCC 125 MG IJ SOLR
60.0000 mg | Freq: Four times a day (QID) | INTRAMUSCULAR | Status: AC
  Administered 2012-05-12 (×3): 60 mg via INTRAVENOUS
  Filled 2012-05-12 (×3): qty 0.96

## 2012-05-12 MED ORDER — MOXIFLOXACIN HCL 400 MG PO TABS
400.0000 mg | ORAL_TABLET | Freq: Every day | ORAL | Status: DC
  Administered 2012-05-12: 400 mg via ORAL
  Filled 2012-05-12 (×2): qty 1

## 2012-05-12 MED ORDER — PREDNISONE 50 MG PO TABS
60.0000 mg | ORAL_TABLET | Freq: Every day | ORAL | Status: DC
  Administered 2012-05-13: 60 mg via ORAL
  Filled 2012-05-12 (×2): qty 1

## 2012-05-12 NOTE — Progress Notes (Signed)
Subjective: Patient states he feels much better however his chief complaint is still the cough and occasional wheezing. The patient states his cough has kept him up at night and that was his predominant symptom that brought him to the physician. He went to see his primary care doctor and was found to have hypoxemia with a need for oxygen his doctor's office. As he was referred to the hospital for further evaluation and management. A CT angiogram was performed to rule out an embolus, and it did not show any findings consistent with pneumonia. Objective: Filed Vitals:   05/12/12 0613 05/12/12 0900 05/12/12 0905 05/12/12 1300  BP: 133/70   149/53  Pulse: 54   68  Temp: 98.5 F (36.9 C)   98.2 F (36.8 C)  TempSrc: Oral   Oral  Resp: 18   18  Height:      Weight:      SpO2: 96% 86% 90% 92%   Weight change:   Intake/Output Summary (Last 24 hours) at 05/12/12 1722 Last data filed at 05/12/12 1300  Gross per 24 hour  Intake    840 ml  Output      0 ml  Net    840 ml    General: Alert, awake, oriented x3, in no acute distress.  HEENT: [place]/AT PEERL, EOMI Neck: Trachea midline,  no masses, no thyromegal,y no JVD, no carotid bruit OROPHARYNX:  Moist, No exudate/ erythema/lesions.  Heart: Regular rate and rhythm, without murmurs, rubs, gallops, PMI non-displaced, no heaves or thrills on palpation.  Lungs: Mild diffuse wheezing wheezing or rhonchi noted. No increased vocal fremitus, hyperesonant to percussion  Abdomen: Soft, nontender, nondistended, positive bowel sounds, no masses no hepatosplenomegaly noted..  Neuro: No focal neurological deficits noted cranial nerves II through XII grossly intact. DTRs 2+ bilaterally upper and lower extremities. Strength functional in bilateral upper and lower extremities. Musculoskeletal: No warm swelling or erythema around joints, no spinal tenderness noted.      Lab Results:  [facility] 05/12/12 0353 05/11/12 1612  NA 135 137  K 3.6 4.1  CL 98 99    CO2 28 25  GLUCOSE 91 102*  BUN 24* 22  CREATININE 1.21 1.01  CALCIUM 8.8 9.1  MG -- --  PHOS -- --    Basename 05/12/12 0353 05/11/12 1612  AST 18 21  ALT 11 12  ALKPHOS 53 64  BILITOT 0.5 0.5  PROT 5.8* 7.1  ALBUMIN 3.1* 3.9   No results found for this basename: LIPASE:2,AMYLASE:2 in the last 72 hours  Basename 05/12/12 0353 05/11/12 1612  WBC 8.1 12.0*  NEUTROABS 5.9 10.0*  HGB 12.0* 14.2  HCT 36.4* 42.4  MCV 85.8 84.8  PLT 125* 149*    Basename 05/11/12 1612  CKTOTAL 451*  CKMB 2.7  CKMBINDEX --  TROPONINI <0.30   No components found with this basename: POCBNP:3  Basename 05/11/12 1832  DDIMER 3.63*   No results found for this basename: HGBA1C:2 in the last 72 hours No results found for this basename: CHOL:2,HDL:2,LDLCALC:2,TRIG:2,CHOLHDL:2,LDLDIRECT:2 in the last 72 hours No results found for this basename: TSH,T4TOTAL,FREET3,T3FREE,THYROIDAB in the last 72 hours No results found for this basename: VITAMINB12:2,FOLATE:2,FERRITIN:2,TIBC:2,IRON:2,RETICCTPCT:2 in the last 72 hours  Micro Results: No results found for this or any previous visit (from the past 240 hour(s)).  Studies/Results: Dg Chest 2 View  05/12/2012  *RADIOLOGY REPORT*  Clinical Data: Weakness with cough and congestion.  CHEST - 2 VIEW  Comparison: Radiographs and CT 05/11/2012.  Findings: The heart  size and mediastinal contours are stable status post CABG.  There is stable chronic lung disease with emphysema and left basilar scarring.  No superimposed airspace disease or significant pleural effusion is identified.  There is chronic pleural thickening related to old right-sided rib fractures.  IMPRESSION: Stable postoperative chest with chronic lung disease.  No acute cardiopulmonary process identified.  Original Report Authenticated By: Gerrianne Scale, M.D.   Dg Chest 2 View  05/11/2012  *RADIOLOGY REPORT*  Clinical Data: Cough and short of breath  CHEST - 2 VIEW  Comparison: None.   Findings: Normal heart size.  Clear lungs.  No pneumothorax and no pleural effusion.  Right-sided rib deformities are chronic.  No definite acute bony deformity.  Osteopenic spine.  IMPRESSION: No active cardiopulmonary disease.  Original Report Authenticated By: Donavan Burnet, M.D.   Ct Angio Chest W/cm &/or Wo Cm  05/11/2012  *RADIOLOGY REPORT*  Clinical Data: Hypoxia.  Elevated D-dimer.  Cough.  Exertional shortness of breath.  Sore throat. "Ashen" and 90% on room air, 97% on 2 liters.  CT ANGIOGRAPHY CHEST  Technique:  Multidetector CT imaging of the chest using the standard protocol during bolus administration of intravenous contrast. Multiplanar reconstructed images including MIPs were obtained and reviewed to evaluate the vascular anatomy.  Contrast: 80mL OMNIPAQUE IOHEXOL 300 MG/ML  SOLN  Comparison: Chest x-ray 05/11/2012  Findings: The pulmonary arteries are well opacified.  There is no evidence for acute pulmonary embolus.  The patient has had median sternotomy and CABG.  Heart size is mildly enlarged.  There is atherosclerotic calcification of the thoracic aorta.  There are emphysematous changes.  No focal consolidations or pleural effusions are identified. The visualized portion of the thyroid gland has a normal appearance.  No mediastinal, hilar, or axillary adenopathy.  Images of the upper abdomen are unremarkable.  IMPRESSION:  1.  Technically adequate exam showing no evidence for acute pulmonary embolus. 2.  Postoperative changes. 3.  Cardiomegaly without evidence for pulmonary edema or focal pulmonary abnormality. 4.  Emphysematous changes.  Original Report Authenticated By: Patterson Hammersmith, M.D.    Medications: I have reviewed the patient's current medications. Scheduled Meds:   . allopurinol  300 mg Oral Daily  . aspirin  81 mg Oral Daily  . atorvastatin  20 mg Oral q1800  . cholecalciferol  1,000 Units Oral Daily  . dextromethorphan-guaiFENesin  1 tablet Oral BID  . diltiazem   300 mg Oral Daily  . docusate sodium  100 mg Oral BID  . doxazosin  8 mg Oral QHS  . dutasteride  0.5 mg Oral Daily  . heparin  5,000 Units Subcutaneous Q8H  . methylPREDNISolone (SOLU-MEDROL) injection  60 mg Intravenous Q6H  . moxifloxacin  400 mg Oral Once  . moxifloxacin  400 mg Oral q1800  . predniSONE  60 mg Oral Q breakfast  . triamterene-hydrochlorothiazide  0.5 tablet Oral Daily  . DISCONTD: aspirin  81 mg Oral Daily  . DISCONTD: levofloxacin  750 mg Oral Q24H  . DISCONTD: levofloxacin  750 mg Oral Q24H  . DISCONTD: simvastatin  40 mg Oral QPM   Continuous Infusions:  PRN Meds:.iohexol Assessment/Plan: Patient Active Hospital Problem List: Acute bronchitis (05/12/2012)   Assessment: Patient presents with appears to be acute bronchitis. The patient does have a history of smoking but has not smoked in greater than 20 years. Given the hypoxemia and diffuse wheezing I will go ahead and start the patient on IV Solu-Medrol today and then transitioned over  to oral prednisone starting tomorrow. There no findings radiologically consistent with pneumonia the patient has no clinical findings consistent with pneumonia thus I will change the Levaquin to Avelox for sterilization of the airways.    HYPERTENSION (07/13/2010)   Assessment: Blood pressure adequately without optimally controlled we'll continue to monitor the patient and adjust medications as necessary.   HEART DISEASE (07/18/2010)   Assessment: Patient has a history of coronary artery disease however no history of congestive heart failure. Presently the patient shows no clinical signs of cardiac decompensation. We will continue to monitor.       LOS: 1 day

## 2012-05-13 DIAGNOSIS — J438 Other emphysema: Secondary | ICD-10-CM

## 2012-05-13 DIAGNOSIS — I25810 Atherosclerosis of coronary artery bypass graft(s) without angina pectoris: Secondary | ICD-10-CM

## 2012-05-13 LAB — DIFFERENTIAL
Basophils Absolute: 0 10*3/uL (ref 0.0–0.1)
Eosinophils Relative: 0 % (ref 0–5)
Lymphocytes Relative: 5 % — ABNORMAL LOW (ref 12–46)
Neutro Abs: 9 10*3/uL — ABNORMAL HIGH (ref 1.7–7.7)
Neutrophils Relative %: 93 % — ABNORMAL HIGH (ref 43–77)

## 2012-05-13 LAB — BASIC METABOLIC PANEL
Calcium: 9.3 mg/dL (ref 8.4–10.5)
Creatinine, Ser: 1.27 mg/dL (ref 0.50–1.35)
GFR calc Af Amer: 59 mL/min — ABNORMAL LOW (ref >90)
GFR calc non Af Amer: 50 mL/min — ABNORMAL LOW (ref >90)
Sodium: 136 mEq/L (ref 135–145)

## 2012-05-13 LAB — CBC
Platelets: 166 10*3/uL (ref 150–400)
RBC: 4.69 MIL/uL (ref 4.22–5.81)
RDW: 14.7 % (ref 11.5–15.5)
WBC: 9.7 10*3/uL (ref 4.0–10.5)

## 2012-05-13 MED ORDER — ALBUTEROL SULFATE HFA 108 (90 BASE) MCG/ACT IN AERS: 2.0000 | INHALATION_SPRAY | Freq: Four times a day (QID) | RESPIRATORY_TRACT | Status: DC | PRN

## 2012-05-13 MED ORDER — DM-GUAIFENESIN ER 30-600 MG PO TB12: 1.0000 | ORAL_TABLET | Freq: Two times a day (BID) | ORAL | Status: AC

## 2012-05-13 MED ORDER — AMOXICILLIN-POT CLAVULANATE 875-125 MG PO TABS: 1.0000 | ORAL_TABLET | Freq: Two times a day (BID) | ORAL | Status: DC

## 2012-05-13 MED ORDER — ATORVASTATIN CALCIUM 20 MG PO TABS: 20.0000 mg | ORAL_TABLET | Freq: Every day | ORAL | Status: AC

## 2012-05-13 MED ORDER — TRIAMTERENE-HCTZ 37.5-25 MG PO TABS
1.0000 | ORAL_TABLET | Freq: Every day | ORAL | Status: DC
  Administered 2012-05-13: 1 via ORAL
  Filled 2012-05-13: qty 1

## 2012-05-13 MED ORDER — PREDNISONE 20 MG PO TABS: 60.0000 mg | ORAL_TABLET | Freq: Every day | ORAL | Status: DC

## 2012-05-13 NOTE — Discharge Summary (Signed)
Physician Discharge Summary  Patient ID: Dustin [name] MRN: [id number] DOB/AGE: 07-12-1926 78 y.o.  Admit date: 05/11/2012 Discharge date: 05/13/2012  Primary Care Physician:  Crawford Givens, MD, MD  Discharge Diagnoses:   1. Acute bronchitis with reactive airway disease 2. Emphysema based on imaging 3. HYPERTENSION 4. CAD s/p CABG 5.  ABDOMINAL AORTIC ANEURYSM s/p Stent 6.  BENIGN PROSTATIC HYPERTROPHY, HX OF 7. history of gout 8. sigmoid diverticulosis 9. dyslipidemia   Medication List  As of 05/13/2012  2:10 PM   STOP taking these medications         simvastatin 40 MG tablet         TAKE these medications         albuterol 108 (90 BASE) MCG/ACT inhaler   Commonly known as: PROVENTIL HFA;VENTOLIN HFA   Inhale 2 puffs into the lungs every 6 (six) hours as needed for wheezing or shortness of breath.      allopurinol 300 MG tablet   Commonly known as: ZYLOPRIM   Take 300 mg by mouth daily.      amoxicillin-clavulanate 875-125 MG per tablet   Commonly known as: AUGMENTIN   Take 1 tablet by mouth 2 (two) times daily. For 5days      aspirin 81 MG tablet   Take 81 mg by mouth daily.      atorvastatin 20 MG tablet   Commonly known as: LIPITOR   Take 1 tablet (20 mg total) by mouth daily at 6 PM.      cholecalciferol 1000 UNITS tablet   Commonly known as: VITAMIN D   Take 1,000 Units by mouth daily.      dextromethorphan-guaiFENesin 30-600 MG per 12 hr tablet   Commonly known as: MUCINEX DM   Take 1 tablet by mouth 2 (two) times daily. For 5 days      diltiazem 300 MG 24 hr capsule   Commonly known as: TIAZAC   Take 300 mg by mouth daily.      docusate sodium 100 MG capsule   Commonly known as: COLACE   Take 100 mg by mouth 2 (two) times daily.      doxazosin 8 MG tablet   Commonly known as: CARDURA   Take 8 mg by mouth at bedtime.      dutasteride 0.5 MG capsule   Commonly known as: AVODART   Take 0.5 mg by mouth daily.      predniSONE 20 MG tablet    Commonly known as: DELTASONE   Take 3 tablets (60 mg total) by mouth daily with breakfast. 40mg  for 2days then 20mg  for 2 days then 10mg  for 2days then STOP      triamterene-hydrochlorothiazide 75-50 MG per tablet   Commonly known as: MAXZIDE   Take 0.5 tablets by mouth daily.      vitamin C 500 MG tablet   Commonly known as: ASCORBIC ACID   Take 1,000 mg by mouth daily.      vitamin E 400 UNIT capsule   Take 400 Units by mouth daily.           Disposition and Follow-up:  PCP Dr. Para March in one week  Significant Diagnostic Studies:   Dg Chest 2 View 05/11/2012    IMPRESSION: No active cardiopulmonary disease.  Original Report Authenticated By: Donavan Burnet, M.D.   Ct Angio Chest W/cm &/or Wo Cm 05/11/2012   IMPRESSION:  1.  Technically adequate exam showing no evidence for acute pulmonary embolus. 2.  Postoperative changes.  3.  Cardiomegaly without evidence for pulmonary edema or focal pulmonary abnormality. 4.  Emphysematous changes.  Original Report Authenticated By: Patterson Hammersmith, M.D.    Brief H and P: Presented with a bad sore throat started on Firday and worsened till Sunday and coughed all ngiht until Monday morning. Went to see Dr. Cheree Ditto his PCP today-Was sent for a cT scan of the lungs to rule out PE which was negative. Has a terrible CP when he rdid cough. NO real fevers noted or documented, but was documented to 98.1 F at [facility]. No production of sputum or phlegm, no sick contacts. No h/o asthma or COPd or Allergies-no usual seasonal allergies either. No bodyaches or pains. apparentrly looked ashen grey to his PCP this am   Hospital Course:  1. Acute Bronchitis with reactive airway disease: Imaging studies namely CT and chest x-ray were negative for PE or pneumonia. Clinically improved with IV antibiotics, in addition he was noted to be wheezing hence a component of reactive airway disease was felt to be contributing to his symptoms, prednisone was added to  his treatment regimen, he had good clinical improvement with antibiotics and steroids, albuterol when necessary was also used, antibiotics will be continued for 5 days along with a quick prednisone  Taper. CT of his chest did reveal emphysematous changes likely related to remote history of smoking. Have not started him on long-term inhalers for COPD, if he has recurrence of reactive airway disease or COPD like flare could consider adding low-dose inhaled steroids and bronchodilators. Ambulatory sats prior discharge were in the 89-90% range hence he will not require home oxygen at discharge  2. Dyslipidemia: Patient is noted to be on diltiazem and greater than 10 mg of simvastatin which increases the risk of rhabdomyolysis and hence he was switched to atorvastatin during hospitalization and at discharge.  Time spent on Discharge:  Signed: Lucrezia Dehne [facility]  05/13/2012, 2:10 PM

## 2012-05-18 LAB — CULTURE, BLOOD (ROUTINE X 2)
Culture  Setup Time: 201305210218
Culture: NO GROWTH

## 2012-05-21 ENCOUNTER — Encounter: Payer: Self-pay | Admitting: Family Medicine

## 2012-05-21 ENCOUNTER — Ambulatory Visit (INDEPENDENT_AMBULATORY_CARE_PROVIDER_SITE_OTHER): Payer: Medicare Other | Admitting: Family Medicine

## 2012-05-21 VITALS — BP 140/56 | HR 72 | Temp 98.5°F | Wt 182.0 lb

## 2012-05-21 DIAGNOSIS — J209 Acute bronchitis, unspecified: Secondary | ICD-10-CM

## 2012-05-21 NOTE — Patient Instructions (Signed)
If you have more muscle aches on the new cholesterol medicine, then notify me or the [place] clinic. If you still have to use the inhaler more than 2-3 times in a week (by the middle of June), let me know.

## 2012-05-21 NOTE — Assessment & Plan Note (Signed)
Resolved by exam, feeling better.  Will need to determine need for SABA vs other inhalers in the future.  D/w pt.  If needing SABA >2-3x/week after this illness, will need other suppressive agent.  He agrees.  F/u prn.

## 2012-05-21 NOTE — Progress Notes (Signed)
Hospital f/u for bronchitis.  Breathing is better now.  Using SABA with relief and done with abx.  No fevers.  SpO2 improved now.  Less sob.  Overall improved.   Hospital records reviewed.   Meds, vitals, and allergies reviewed.   ROS: See HPI.  Otherwise, noncontributory.  nad ncat Mmm rrr ctab w/o focal dec in BS, no wheeze Ext w/o cyanosis or edema.

## 2012-08-10 ENCOUNTER — Ambulatory Visit (INDEPENDENT_AMBULATORY_CARE_PROVIDER_SITE_OTHER): Payer: Medicare Other | Admitting: Family Medicine

## 2012-08-10 ENCOUNTER — Encounter: Payer: Self-pay | Admitting: Family Medicine

## 2012-08-10 ENCOUNTER — Telehealth: Payer: Self-pay | Admitting: Family Medicine

## 2012-08-10 VITALS — BP 142/58 | HR 65 | Temp 98.2°F | Wt 186.8 lb

## 2012-08-10 DIAGNOSIS — R0609 Other forms of dyspnea: Secondary | ICD-10-CM

## 2012-08-10 DIAGNOSIS — R609 Edema, unspecified: Secondary | ICD-10-CM | POA: Insufficient documentation

## 2012-08-10 DIAGNOSIS — R0989 Other specified symptoms and signs involving the circulatory and respiratory systems: Secondary | ICD-10-CM

## 2012-08-10 LAB — BASIC METABOLIC PANEL
Chloride: 100 mEq/L (ref 96–112)
Creatinine, Ser: 1.1 mg/dL (ref 0.4–1.5)
Potassium: 3.9 mEq/L (ref 3.5–5.1)
Sodium: 141 mEq/L (ref 135–145)

## 2012-08-10 LAB — BRAIN NATRIURETIC PEPTIDE: Pro B Natriuretic peptide (BNP): 109 pg/mL — ABNORMAL HIGH (ref 0.0–100.0)

## 2012-08-10 NOTE — Assessment & Plan Note (Signed)
See notes on labs.  If persistent in spite of limiting salt, would consider lasix trial. Nontoxic, lungs clear.  D/w pt.

## 2012-08-10 NOTE — Patient Instructions (Addendum)
Cut back on salt (canned soup).  Go to the lab on the way out.  We'll contact you with your lab report. Don't change your meds for now.  Take care.

## 2012-08-10 NOTE — Progress Notes (Signed)
DOE on vacation in [place].  It was hot and humid.  When walking he had to stop and catch his breath, he had some exertional fatigue and cramping in his thighs bilaterally but no calf pain.  Has had edema over the last few months, usually worse in L ankle, no change early AM vs later in the day.  Not SOB now.  No CP.  No exertional sx in the legs now with walking.    Eating a lot of canned soups with salt in them.   Meds, vitals, and allergies reviewed.   ROS: See HPI.  Otherwise, noncontributory.  nad ncat Mmm rrr ctab abd soft, not ttp Ext with 1-2 + edema, L>R ankle DP pulses intact B

## 2012-08-10 NOTE — Telephone Encounter (Signed)
Opened in error

## 2012-09-16 ENCOUNTER — Encounter: Payer: Self-pay | Admitting: Family Medicine

## 2012-09-16 ENCOUNTER — Ambulatory Visit (INDEPENDENT_AMBULATORY_CARE_PROVIDER_SITE_OTHER): Payer: Medicare Other | Admitting: Family Medicine

## 2012-09-16 ENCOUNTER — Ambulatory Visit
Admission: RE | Admit: 2012-09-16 | Discharge: 2012-09-16 | Disposition: A | Discharge status: Home / Self Care | Payer: Medicare Other | Source: Ambulatory Visit | Attending: Family Medicine | Admitting: Family Medicine

## 2012-09-16 ENCOUNTER — Ambulatory Visit (INDEPENDENT_AMBULATORY_CARE_PROVIDER_SITE_OTHER)
Admission: RE | Admit: 2012-09-16 | Discharge: 2012-09-16 | Disposition: A | Discharge status: Home / Self Care | Payer: Medicare Other | Source: Ambulatory Visit | Attending: Family Medicine | Admitting: Family Medicine

## 2012-09-16 VITALS — BP 132/67 | HR 73 | Temp 97.8°F | Ht 64.0 in | Wt 185.5 lb

## 2012-09-16 DIAGNOSIS — M25529 Pain in unspecified elbow: Secondary | ICD-10-CM

## 2012-09-16 DIAGNOSIS — M79601 Pain in right arm: Secondary | ICD-10-CM

## 2012-09-16 DIAGNOSIS — M79609 Pain in unspecified limb: Secondary | ICD-10-CM

## 2012-09-16 DIAGNOSIS — M949 Disorder of cartilage, unspecified: Secondary | ICD-10-CM

## 2012-09-16 DIAGNOSIS — M898X2 Other specified disorders of bone, upper arm: Secondary | ICD-10-CM

## 2012-09-16 DIAGNOSIS — S42209A Unspecified fracture of upper end of unspecified humerus, initial encounter for closed fracture: Secondary | ICD-10-CM

## 2012-09-16 NOTE — Progress Notes (Signed)
Nature conservation officer at [facility] [address] [place] [address] Phone: [phone] Fax: [phone]  Date:  09/16/2012   Name:  Dustin [name]   DOB:  11-24-1926   MRN:  [id number] Gender: male Age: 78 y.o.  PCP:  Crawford Givens, MD    Chief Complaint: Arm Pain   History of Present Illness:  Dustin [name] is a 78 y.o. pleasant patient who presents with the following:  I was asked to see the patient emergently, as he was more than an hour half late to his appointment. The patient fell yesterday onto his right arm in the driveway. Since then, he has been unable to elevate his arm and unable to move it at the elbow. His arm is at his side, and he is minimally able to grip or move and manipulate his right arm. Today is his first time seeking evaluation. He is currently having minimal amount of bruising. There is some swelling, but he is in some significant amount of pain, and he did require some Advil last evening. He has not had any prior operative fixation of the affected arm. He is right-handed.  Patient Active Problem List  Diagnosis  . HYPERLIPIDEMIA  . HYPERTENSION  . HEART DISEASE  . ABDOMINAL AORTIC ANEURYSM  . BENIGN PROSTATIC HYPERTROPHY, HX OF  . CHICKENPOX, HX OF  . Dysuria  . Edema    Past Medical History  Diagnosis Date  . Hyperlipidemia   . Hypertension   . Abdominal aortic aneurysm     with endoleak per Dr. Janetta Hora, aneurysmal sac size stable as of 12/11 (7.5 x 6.1cm)  . Benign prostatic hypertrophy   . History of chickenpox   . Dysuria   . Heart disease   . Gout   . Sigmoid diverticulosis     Past Surgical History  Procedure Date  . Hernia repair 2011    Right  . Coronary artery bypass graft 1991  . Bilateral cataract surgery 2011  . Aaa stent 2008  . Left cea 1997    History  Substance Use Topics  . Smoking status: Former Smoker -- 1.0 packs/day for 40 years    Types: Cigarettes    Quit date: 12/23/1988  . Smokeless tobacco: Not  on file  . Alcohol Use: Yes    Family History  Problem Relation Age of Onset  . Stroke Mother   . Heart disease Father 62    Sudden death    No Known Allergies  Medication list has been reviewed and updated.  Outpatient Prescriptions Prior to Visit  Medication Sig Dispense Refill  . albuterol (PROVENTIL HFA;VENTOLIN HFA) 108 (90 BASE) MCG/ACT inhaler Inhale 2 puffs into the lungs every 6 (six) hours as needed for wheezing or shortness of breath.  1 Inhaler  0  . allopurinol (ZYLOPRIM) 300 MG tablet Take 300 mg by mouth daily.      Marland Kitchen aspirin 81 MG tablet Take 81 mg by mouth daily.      Marland Kitchen atorvastatin (LIPITOR) 20 MG tablet Take 1 tablet (20 mg total) by mouth daily at 6 PM.  30 tablet  0  . cholecalciferol (VITAMIN D) 1000 UNITS tablet Take 1,000 Units by mouth daily.      Marland Kitchen diltiazem (TIAZAC) 300 MG 24 hr capsule Take 300 mg by mouth daily.      Marland Kitchen docusate sodium (COLACE) 100 MG capsule Take 100 mg by mouth 2 (two) times daily.      Marland Kitchen doxazosin (CARDURA) 8 MG  tablet Take 8 mg by mouth at bedtime.      . dutasteride (AVODART) 0.5 MG capsule Take 0.5 mg by mouth daily.      Marland Kitchen triamterene-hydrochlorothiazide (MAXZIDE) 75-50 MG per tablet Take 0.5 tablets by mouth daily.      . vitamin C (ASCORBIC ACID) 500 MG tablet Take 1,000 mg by mouth daily.       . vitamin E 400 UNIT capsule Take 400 Units by mouth daily.        Review of Systems:   GEN: No fevers, chills. Nontoxic. Primarily MSK c/o today. MSK: Detailed in the HPI GI: tolerating PO intake without difficulty Neuro: No numbness, parasthesias, or tingling associated. Otherwise, the pertinent positives and negatives are listed above and in the HPI, otherwise a full review of systems has been reviewed and is negative unless noted positive.     Physical Examination: Filed Vitals:   09/16/12 1300  BP: 132/67  Pulse: 73  Temp: 97.8 F (36.6 C)   Filed Vitals:   09/16/12 1300  Height: 5\' 4"  (1.626 m)  Weight: 185 lb 8 oz  (84.142 kg)   Body mass index is 31.84 kg/(m^2). Ideal Body Weight: Weight in (lb) to have BMI = 25: 145.3    GEN: WDWN, NAD, Non-toxic, Alert & Oriented x 3 HEENT: Atraumatic, Normocephalic.  Ears and Nose: No external deformity. EXTR: No clubbing/cyanosis/edema NEURO: Normal gait.  PSYCH: Normally interactive. Conversant. Not depressed or anxious appearing.  Calm demeanor.   MSK: Extensive manipulation has not undergone given potential concern for fracture. Nontender along the clavicle. Nontender at the a.c. joint. The patient is markedly tender at the proximal humerus. Mild to minimally tender at the distal humerus. Range of motion is not attempted given concern for fracture and tenderness at the proximal humerus.  The patient is able only move his elbow about 15. He does have some tenderness in about the elbow at the proximal radius and proximal ulna. Nontender along the shafts of the radius and ulna. Nontender at the distal radius. Nontender at the distal ulna.  The patient's prescription is minimal at this point. Is not having any pain with radial and ulnar deviation. Non-tender with axillary loading of the wrist. Nontender grossly along all of his metacarpals or phalanges.  Assessment and Plan:  1. Proximal humerus fracture  Ambulatory referral to Orthopedic Surgery  2. Arm pain, right  DG Humerus Right, DG Elbow Complete Right, Ambulatory referral to Orthopedic Surgery  3. Pain in humerus  DG Humerus Right, DG Elbow Complete Right, Ambulatory referral to Orthopedic Surgery  4. Elbow pain  DG Humerus Right, DG Elbow Complete Right, Ambulatory referral to Orthopedic Surgery   >40 minutes spent in face to face time with patient, >50% spent in counselling or coordination of care  Also attempted to obtain an elbow series, but the patient was unable to sit for all views of the elbow series. At this point the status of his elbow is unclear or unknown. Stabilized him in a sling.  Fortunately, Dr. Melvyn Novas is able to see him in the morning at 8 AM, and I appreciate his assistance.   Nondisplaced proximal humerus fracture. Fracture in the humeral neck.  Dg Humerus Right  09/16/2012  *RADIOLOGY REPORT*  Clinical Data: Concern for proximal humerus fracture.  RIGHT HUMERUS - 2+ VIEW  Comparison: 05/12/2012 chest radiograph  Findings: The humeral head remains seated within the glenoid however appears abducted relative to the shaft.  There is a fracture suggested  through the humeral neck with superior migration of the distal component. No appreciable underlying osseous lesion. Acromioclavicular degenerative change. The right upper lung is clear.  IMPRESSION: Right humeral neck fracture.   Original Report Authenticated By: Waneta Martins, M.D.     Orders Today:  Orders Placed This Encounter  Procedures  . DG Humerus Right    Standing Status: Future     Number of Occurrences: 1     Standing Expiration Date: 11/16/2013    Order Specific Question:  Reason for exam:    Answer:  concern for proximal humerus fracture    Order Specific Question:  Preferred imaging location?    Answer:  [facility]  . Ambulatory referral to Orthopedic Surgery    Referral Priority:  Routine    Referral Type:  Surgical    Referral Reason:  Specialty Services Required    Requested Specialty:  Orthopedic Surgery    Number of Visits Requested:  1    Updated Medication List: (Includes new medications, updates to list, dose adjustments) No orders of the defined types were placed in this encounter.    Medications Discontinued: There are no discontinued medications.   Hannah Beat, MD

## 2012-09-16 NOTE — Patient Instructions (Addendum)
REFERRAL: GO THE THE FRONT ROOM AT THE ENTRANCE OF OUR CLINIC, NEAR CHECK IN. ASK FOR Dustin [name]. SHE WILL HELP YOU SET UP YOUR REFERRAL. DATE: TIME:  

## 2012-10-05 ENCOUNTER — Ambulatory Visit (INDEPENDENT_AMBULATORY_CARE_PROVIDER_SITE_OTHER): Payer: Medicare Other

## 2012-10-05 DIAGNOSIS — Z23 Encounter for immunization: Secondary | ICD-10-CM

## 2012-10-22 ENCOUNTER — Encounter: Payer: Self-pay | Admitting: Orthopedic Surgery

## 2012-10-23 ENCOUNTER — Encounter: Payer: Self-pay | Admitting: Orthopedic Surgery

## 2012-11-16 ENCOUNTER — Other Ambulatory Visit: Payer: Self-pay | Admitting: Family Medicine

## 2012-11-16 NOTE — Telephone Encounter (Signed)
Sent!

## 2012-11-16 NOTE — Telephone Encounter (Signed)
Electronic refill request.  Please advise. 

## 2012-11-22 ENCOUNTER — Encounter: Payer: Self-pay | Admitting: Orthopedic Surgery

## 2012-12-23 ENCOUNTER — Encounter: Payer: Self-pay | Admitting: Orthopedic Surgery

## 2013-01-07 ENCOUNTER — Encounter: Payer: Self-pay | Admitting: Family Medicine

## 2013-01-07 DIAGNOSIS — D039 Melanoma in situ, unspecified: Secondary | ICD-10-CM | POA: Insufficient documentation

## 2013-09-28 ENCOUNTER — Ambulatory Visit (INDEPENDENT_AMBULATORY_CARE_PROVIDER_SITE_OTHER): Payer: Medicare Other | Admitting: Podiatry

## 2013-09-28 ENCOUNTER — Encounter: Payer: Self-pay | Admitting: Podiatry

## 2013-09-28 ENCOUNTER — Ambulatory Visit (INDEPENDENT_AMBULATORY_CARE_PROVIDER_SITE_OTHER): Payer: Medicare Other

## 2013-09-28 VITALS — BP 130/56 | HR 77 | Temp 98.4°F | Resp 16 | Ht 67.0 in | Wt 185.6 lb

## 2013-09-28 DIAGNOSIS — M79609 Pain in unspecified limb: Secondary | ICD-10-CM

## 2013-09-28 DIAGNOSIS — M109 Gout, unspecified: Secondary | ICD-10-CM

## 2013-09-28 DIAGNOSIS — R52 Pain, unspecified: Secondary | ICD-10-CM

## 2013-09-28 DIAGNOSIS — M7750 Other enthesopathy of unspecified foot: Secondary | ICD-10-CM

## 2013-09-28 DIAGNOSIS — B351 Tinea unguium: Secondary | ICD-10-CM

## 2013-09-28 NOTE — Progress Notes (Signed)
N NO PAIN  L TRIM TOENAILS D 1 YR O SLOWLY C WORSE A GROWING OUT T PT TRIMS TOENAILS   N HURT L LEFT FOOT/PLANTAR  D OFF AND ON 2-3 MONTHS O ? C ? A WEARING YARD SHOES T TAKE ALLOPURINOL

## 2013-09-28 NOTE — Progress Notes (Signed)
Subjective:     Patient ID: Dustin [name], male   DOB: 1926-11-17, 79 y.o.   MRN: [id number]  HPI my toenails can no longer be cut by me or my wife. Has had a history of gout my left foot and it will get sore at times.   Review of Systems  All other systems reviewed and are negative.       Objective:   Physical Exam  Nursing note and vitals reviewed. Cardiovascular: Intact distal pulses.    neurological intact. Muscle strength found to be adequate. Range of motion subtalar and midtarsal joint within normal limits. Mild to moderate edema in the forefoot and ankle left secondary to previous vein surgery. No significant varicosities noted. Systems review is on from the chart and reviewed with the patient. Well oriented x3. Nailbed 1-5 bilateral are found to be elongated and thickened.    Assessment:     Chronic gout left foot with mild edema. Mycotic toenail infection with no pain 1-5 bilateral.    Plan:     Initial H&P done. X-ray reviewed left. Debridable nailbed 1-5 bilateral accomplished. Reviewed edema in the left foot ankle and advised on elevation and possible compression.

## 2013-09-28 NOTE — Patient Instructions (Signed)
Call if any problems

## 2013-10-13 ENCOUNTER — Ambulatory Visit (INDEPENDENT_AMBULATORY_CARE_PROVIDER_SITE_OTHER): Payer: Medicare Other

## 2013-10-13 DIAGNOSIS — Z23 Encounter for immunization: Secondary | ICD-10-CM

## 2013-12-20 ENCOUNTER — Ambulatory Visit (INDEPENDENT_AMBULATORY_CARE_PROVIDER_SITE_OTHER): Payer: Medicare Other | Admitting: Family Medicine

## 2013-12-20 ENCOUNTER — Encounter: Payer: Self-pay | Admitting: Family Medicine

## 2013-12-20 VITALS — BP 132/66 | HR 95 | Temp 98.3°F | Ht 67.0 in | Wt 186.5 lb

## 2013-12-20 DIAGNOSIS — J069 Acute upper respiratory infection, unspecified: Secondary | ICD-10-CM

## 2013-12-20 MED ORDER — BENZONATATE 200 MG PO CAPS: 200.0000 mg | ORAL_CAPSULE | Freq: Two times a day (BID) | ORAL | Status: DC | PRN

## 2013-12-20 NOTE — Assessment & Plan Note (Signed)
Symptoms care, if not improving in next 3-4 day will consider antibiotics goiven smoking and possible COPD history.  SOB is at baseline.

## 2013-12-20 NOTE — Progress Notes (Signed)
Pre-visit discussion using our clinic review tool. No additional management support is needed unless otherwise documented below in the visit note.  

## 2013-12-20 NOTE — Patient Instructions (Signed)
Mucinex DM for cough and to break up mucus,. Push fluids, rest.  Benzonatate for cough as needed. Call if not improving in next 3-4 days as expected.

## 2013-12-20 NOTE — Progress Notes (Signed)
   Subjective:    Patient ID: Dustin [name], male    DOB: 07/07/1926, 79 y.o.   MRN: [id number]  Cough This is a new problem. The current episode started in the past 7 days (runny nose for 1 week, then over the weekend congestion  in chest ). The problem has been gradually worsening. The problem occurs constantly. The cough is non-productive. Associated symptoms include nasal congestion, a sore throat and shortness of breath. Pertinent negatives include no chills, ear congestion, ear pain, fever, headaches, hemoptysis, myalgias, rhinorrhea or wheezing. Associated symptoms comments: No sinus pain  no appetite. Treatments tried: robutussin, ricola drops. The treatment provided no relief. His past medical history is significant for asthma, COPD and environmental allergies. There is no history of bronchiectasis, bronchitis, emphysema or pneumonia. possible copd/ asthma      Review of Systems  Constitutional: Negative for fever and chills.  HENT: Positive for sore throat. Negative for ear pain and rhinorrhea.   Respiratory: Positive for cough and shortness of breath. Negative for hemoptysis and wheezing.   Musculoskeletal: Negative for myalgias.  Allergic/Immunologic: Positive for environmental allergies.  Neurological: Negative for headaches.       Objective:   Physical Exam  Constitutional: Vital signs are normal. He appears well-developed and well-nourished.  Non-toxic appearance. He does not appear ill. No distress.  HENT:  Head: Normocephalic and atraumatic.  Right Ear: Hearing, tympanic membrane, external ear and ear canal normal. No tenderness. No foreign bodies. Tympanic membrane is not retracted and not bulging.  Left Ear: Hearing, tympanic membrane, external ear and ear canal normal. No tenderness. No foreign bodies. Tympanic membrane is not retracted and not bulging.  Nose: Nose normal. No mucosal edema or rhinorrhea. Right sinus exhibits no maxillary sinus tenderness and no frontal  sinus tenderness. Left sinus exhibits no maxillary sinus tenderness and no frontal sinus tenderness.  Mouth/Throat: Uvula is midline, oropharynx is clear and moist and mucous membranes are normal. Normal dentition. No dental caries. No oropharyngeal exudate or tonsillar abscesses.  Eyes: Conjunctivae, EOM and lids are normal. Pupils are equal, round, and reactive to light. Lids are everted and swept, no foreign bodies found.  Neck: Trachea normal, normal range of motion and phonation normal. Neck supple. Carotid bruit is not present. No mass and no thyromegaly present.  Cardiovascular: Normal rate, regular rhythm, S1 normal, S2 normal, normal heart sounds, intact distal pulses and normal pulses.  Exam reveals no gallop.   No murmur heard. Pulmonary/Chest: Effort normal and breath sounds normal. No respiratory distress. He has no wheezes. He has no rhonchi. He has no rales.  Abdominal: Soft. Normal appearance and bowel sounds are normal. There is no hepatosplenomegaly. There is no tenderness. There is no rebound, no guarding and no CVA tenderness. No hernia.  Neurological: He is alert. He has normal reflexes.  Skin: Skin is warm, dry and intact. No rash noted.  Psychiatric: He has a normal mood and affect. His speech is normal and behavior is normal. Judgment normal.          Assessment & Plan:

## 2014-08-09 ENCOUNTER — Encounter: Payer: Self-pay | Admitting: Family Medicine

## 2014-08-09 ENCOUNTER — Ambulatory Visit (INDEPENDENT_AMBULATORY_CARE_PROVIDER_SITE_OTHER): Payer: Medicare Other | Admitting: Family Medicine

## 2014-08-09 VITALS — BP 144/60 | HR 70 | Temp 97.8°F | Wt 189.2 lb

## 2014-08-09 DIAGNOSIS — I2510 Atherosclerotic heart disease of native coronary artery without angina pectoris: Secondary | ICD-10-CM

## 2014-08-09 DIAGNOSIS — Z951 Presence of aortocoronary bypass graft: Secondary | ICD-10-CM

## 2014-08-09 DIAGNOSIS — R809 Proteinuria, unspecified: Secondary | ICD-10-CM

## 2014-08-09 LAB — POCT URINALYSIS DIPSTICK
BILIRUBIN UA: NEGATIVE
Glucose, UA: NEGATIVE
Ketones, UA: NEGATIVE
Leukocytes, UA: NEGATIVE
NITRITE UA: NEGATIVE
PH UA: 6
RBC UA: NEGATIVE
SPEC GRAV UA: 1.025
UROBILINOGEN UA: 0.2

## 2014-08-09 NOTE — Progress Notes (Addendum)
Pre visit review using our clinic review tool, if applicable. No additional management support is needed unless otherwise documented below in the visit note.  Pt not seen by me in about 2 years.    H/o CAD. [facility] (I thought [facility] cardiac f/u was through [facility]), and other than inc in vit D the only other change was stopping HCTZ/triamterene.  Occ R sided CP, can happen at night, supine.  No exertional CP. Some SOB, worse in the heat, worse recently.  Some BLE edema at the ankles, usually L>R, not on the shins.  He needed cards est locally.   [facility].    He has proteinuria of unknown significant noted on u/a at [facility].  D/w pt today.  F/u labs pending.   PMH and SH reviewed  ROS: See HPI, otherwise noncontributory.  Meds, vitals, and allergies reviewed.   GEN: nad, alert and oriented HEENT: mucous membranes moist NECK: supple w/o LA CV: rrr.  PULM: ctab, no inc wob ABD: soft, +bs EXT: 1+ BLE edema SKIN: no acute rash

## 2014-08-09 NOTE — Patient Instructions (Signed)
Take care.  Glad to see you.  Shirlee LimerickMarion will call about your referral for the heart clinic.

## 2014-08-10 DIAGNOSIS — R809 Proteinuria, unspecified: Secondary | ICD-10-CM | POA: Insufficient documentation

## 2014-08-10 DIAGNOSIS — Z951 Presence of aortocoronary bypass graft: Secondary | ICD-10-CM | POA: Insufficient documentation

## 2014-08-10 NOTE — Assessment & Plan Note (Signed)
See notes on labs. 

## 2014-08-10 NOTE — Assessment & Plan Note (Signed)
Not seen by me in years.  My understanding was that his f/u for cardiac care was via [facility].  Will refer for local care.  D/w pt.  Recent labs reviewed, along with EKG.  See scanned labs.  No worrisome CP but with likely deconditioning worsened by summertime heat.  Okay for outpatient f/u.  >25 minutes spent in face to face time with patient, >50% spent in counselling or coordination of care.

## 2014-08-18 ENCOUNTER — Encounter: Payer: Self-pay | Admitting: Family Medicine

## 2014-08-18 LAB — CHOLESTEROL, TOTAL
Cholesterol, Total: 134
HDL: 70 mg/dL (ref 35–70)
LDL (calc): 55
TRIGLYCERIDES: 43
TSH: 0.827
VIT D 25 HYDROXY: 28.78

## 2014-08-18 LAB — PROTEIN, URINE, RANDOM
ALT: 21 U/L (ref 10–40)
AST: 14 U/L
Creatinine, Ser: 1.3
Glucose: 110
PROTEIN UR: 3.4
Potassium: 3.4 mmol/L

## 2014-08-23 ENCOUNTER — Ambulatory Visit: Payer: Medicare Other | Admitting: Cardiovascular Disease

## 2014-08-25 ENCOUNTER — Ambulatory Visit (INDEPENDENT_AMBULATORY_CARE_PROVIDER_SITE_OTHER): Payer: Medicare Other | Admitting: Cardiovascular Disease

## 2014-08-25 ENCOUNTER — Encounter: Payer: Self-pay | Admitting: Cardiovascular Disease

## 2014-08-25 VITALS — BP 110/50 | HR 65 | Ht 65.0 in | Wt 190.8 lb

## 2014-08-25 DIAGNOSIS — E785 Hyperlipidemia, unspecified: Secondary | ICD-10-CM

## 2014-08-25 DIAGNOSIS — I10 Essential (primary) hypertension: Secondary | ICD-10-CM

## 2014-08-25 DIAGNOSIS — R0602 Shortness of breath: Secondary | ICD-10-CM

## 2014-08-25 DIAGNOSIS — Z951 Presence of aortocoronary bypass graft: Secondary | ICD-10-CM

## 2014-08-25 DIAGNOSIS — I714 Abdominal aortic aneurysm, without rupture, unspecified: Secondary | ICD-10-CM

## 2014-08-25 DIAGNOSIS — R000 Tachycardia, unspecified: Secondary | ICD-10-CM

## 2014-08-25 NOTE — Progress Notes (Signed)
Primary care physician: Dr. Para March  HPI  This is a pleasant 80 year old man who was referred to establish cardiovascular care. He has extensive cardiovascular history. He had CABG in 1991 at [facility] with no cardiac events since then, abdominal aortic aneurysm status post stent graft placement also at [facility], left carotid endarterectomy, hyperlipidemia and hypertension. He does not normally followup with a cardiologist. Most of his followup has been at the [place]. He complains of significant worsening of exertional dyspnea over the last few months. He had few episodes of right-sided chest discomfort but denies any tightness. He has chronic lower extremity edema but denies orthopnea or PND.  No Known Allergies   Current Outpatient Prescriptions on File Prior to Visit  Medication Sig Dispense Refill  . albuterol (PROVENTIL HFA;VENTOLIN HFA) 108 (90 BASE) MCG/ACT inhaler Inhale 2 puffs into the lungs every 6 (six) hours as needed for wheezing or shortness of breath.  1 Inhaler  0  . allopurinol (ZYLOPRIM) 300 MG tablet Take 300 mg by mouth daily.      Marland Kitchen aspirin 81 MG tablet Take 81 mg by mouth daily.      Marland Kitchen atorvastatin (LIPITOR) 20 MG tablet Take 1 tablet (20 mg total) by mouth daily at 6 PM.  30 tablet  0  . cholecalciferol (VITAMIN D) 1000 UNITS tablet Take 4,000 Units by mouth daily.       . clotrimazole-betamethasone (LOTRISONE) cream APPLY TO AFFECTED AREA TWICE DAILY AS NEEDED  30 g  0  . diltiazem (TIAZAC) 300 MG 24 hr capsule Take 300 mg by mouth daily.      Marland Kitchen docusate sodium (COLACE) 100 MG capsule Take 100 mg by mouth 2 (two) times daily.      Marland Kitchen doxazosin (CARDURA) 8 MG tablet Take 8 mg by mouth at bedtime.      . dutasteride (AVODART) 0.5 MG capsule Take 0.5 mg by mouth daily.      Marland Kitchen losartan (COZAAR) 50 MG tablet Take 25 mg by mouth daily.      . vitamin C (ASCORBIC ACID) 500 MG tablet Take 1,000 mg by mouth daily.       . vitamin E 400 UNIT capsule Take 400 Units by mouth  daily.       No current facility-administered medications on file prior to visit.     Past Medical History  Diagnosis Date  . Hyperlipidemia   . Hypertension   . Abdominal aortic aneurysm     with endoleak per Dr. Janetta Hora, aneurysmal sac size stable as of 12/11 (7.5 x 6.1cm)  . Benign prostatic hypertrophy   . History of chickenpox   . Dysuria   . Heart disease   . Gout   . Sigmoid diverticulosis   . Melanoma in situ     Per [facility] 2014  . MI (myocardial infarction)   . Coronary artery disease      Past Surgical History  Procedure Laterality Date  . Hernia repair  2011    Right  . Coronary artery bypass graft  1991  . Bilateral cataract surgery  2011  . Aaa stent  2008  . Left cea  1997     Family History  Problem Relation Age of Onset  . Stroke Mother   . Heart disease Father 9    Sudden death  . Heart attack Father      History   Social History  . Marital Status: Married    Spouse Name: N/A  Number of Children: 3  . Years of Education: N/A   Occupational History  . Retired [employer]    Social History Main Topics  . Smoking status: Former Smoker -- 1.00 packs/day for 40 years    Types: Cigarettes    Quit date: 12/23/1988  . Smokeless tobacco: Never Used  . Alcohol Use: Yes     Comment: WINE 1-2 TIMES WEEKLY AND BEER OCCASSIONALLY  . Drug Use: No  . Sexual Activity: Not on file   Other Topics Concern  . Not on file   Social History Narrative   Married since 1951      Former [employer], deployed to [place] prev    No regular exercise.   3 children, 1 died of breast CA, another had breast CA           ROS A 10 point review of system was performed. It is negative other than that mentioned in the history of present illness.   PHYSICAL EXAM   BP 110/50  Pulse 65  Ht  (1.651 m)  Wt 190 lb 12 oz (86.524 kg)  BMI 31.74 kg/m2 Constitutional: He is oriented to person, place, and time. He appears well-developed and  well-nourished. No distress.  HENT: No nasal discharge.  Head: Normocephalic and atraumatic.  Eyes: Pupils are equal and round.  No discharge. Neck: Normal range of motion. Neck supple. No JVD present. No thyromegaly present.  Cardiovascular: Normal rate, regular rhythm, normal heart sounds. Exam reveals no gallop and no friction rub. No murmur heard.  Pulmonary/Chest: Effort normal and breath sounds normal. No stridor. No respiratory distress. He has no wheezes. He has no rales. He exhibits no tenderness.  Abdominal: Soft. Bowel sounds are normal. He exhibits no distension. There is no tenderness. There is no rebound and no guarding.  Musculoskeletal: Normal range of motion. He exhibits +1 edema and no tenderness.  Neurological: He is alert and oriented to person, place, and time. Coordination normal.  Skin: Skin is warm and dry. No rash noted. He is not diaphoretic. No erythema. No pallor.  Psychiatric: He has a normal mood and affect. His behavior is normal. Judgment and thought content normal.       ZOX:WRUEA  Rhythm  -  Diffuse nonspecific T-abnormality.   ABNORMAL    ASSESSMENT AND PLAN

## 2014-08-25 NOTE — Assessment & Plan Note (Signed)
Chest pain is overall atypical. However, he describes significant worsening of exertional dyspnea over the last few months. I don't see signs of fluid overload by physical exam. Obviously, this could be angina equivalent considering the age of his bypass grafts. I suggested evaluation with a stress test but the patient is not interested. I ordered an echocardiogram to evaluate LV systolic function. Continue medical therapy for now.

## 2014-08-25 NOTE — Assessment & Plan Note (Signed)
Lab Results  Component Value Date   CHOL 152 12/09/2011   HDL 70 08/05/2014   LDLCALC 55 08/05/2014   TRIG 43 08/05/2014   Continue treatment with atorvastatin. LDL is at target.

## 2014-08-25 NOTE — Assessment & Plan Note (Signed)
Blood pressure is well controlled on current medications. Most recent labs showed unremarkable CBC except for a platelet count of 146,000. Electrolytes were normal except for mild hypokalemia at 3.4. Creatinine was 1.3. Some of his lower extremity edema might be related to treatment with diltiazem.

## 2014-08-25 NOTE — Assessment & Plan Note (Signed)
This was followed by vascular surgery.

## 2014-08-25 NOTE — Patient Instructions (Signed)

## 2014-09-02 ENCOUNTER — Other Ambulatory Visit: Payer: Self-pay

## 2014-09-02 ENCOUNTER — Other Ambulatory Visit (INDEPENDENT_AMBULATORY_CARE_PROVIDER_SITE_OTHER): Payer: Medicare Other

## 2014-09-02 DIAGNOSIS — I2510 Atherosclerotic heart disease of native coronary artery without angina pectoris: Secondary | ICD-10-CM

## 2014-09-02 DIAGNOSIS — R0602 Shortness of breath: Secondary | ICD-10-CM

## 2014-09-07 ENCOUNTER — Telehealth: Payer: Self-pay

## 2014-09-07 NOTE — Telephone Encounter (Signed)
Agree with cards.  I got the reports.  Echo was fine.  If he has more sx- more exertional SOB or if he has CP- then I would have him notify cards re: possible stress testing.  Thanks.

## 2014-09-07 NOTE — Telephone Encounter (Signed)
Pt left v/m; pt saw heart doctor and pt was advised by heart doctor could not find anything wrong; pt wants to make sure Dr Para March got report from heart doctor and what does Dr Para March think?

## 2014-09-08 NOTE — Telephone Encounter (Signed)
Patient notified as instructed by telephone. Copy left up front for patient to pick up per Dr. Para March.

## 2014-09-27 ENCOUNTER — Ambulatory Visit: Payer: Medicare Other

## 2014-09-27 ENCOUNTER — Ambulatory Visit (INDEPENDENT_AMBULATORY_CARE_PROVIDER_SITE_OTHER): Payer: Medicare Other

## 2014-09-27 DIAGNOSIS — Z23 Encounter for immunization: Secondary | ICD-10-CM

## 2015-03-30 ENCOUNTER — Encounter: Payer: Self-pay | Admitting: Family Medicine

## 2015-03-30 LAB — HEMOGLOBIN A1C
A1c: 6.2
Creatinine, Ser: 1.19
TSH: 1.13

## 2015-08-29 LAB — TSH: TSH: 1.27 u[IU]/mL (ref <=5.90)

## 2015-08-29 LAB — LIPID PANEL
Cholesterol: 174 mg/dL (ref 0–200)
HDL: 73 mg/dL — AB (ref 35–70)
Triglycerides: 47 mg/dL (ref 40–160)

## 2015-08-29 LAB — BASIC METABOLIC PANEL
CREATININE: 1.2 mg/dL (ref <=1.3)
Potassium: 3.1 mmol/L — AB (ref 3.4–5.3)

## 2015-08-29 LAB — CBC AND DIFFERENTIAL: HEMOGLOBIN: 13.6 g/dL (ref 13.5–17.5)

## 2015-08-29 LAB — HEMOGLOBIN A1C: HEMOGLOBIN A1C: 6.2 % — AB (ref 4.0–6.0)

## 2015-08-29 LAB — HEPATIC FUNCTION PANEL
ALT: 17 U/L (ref 10–40)
AST: 13 U/L — AB (ref 14–40)

## 2015-09-08 ENCOUNTER — Encounter (HOSPITAL_COMMUNITY): Payer: Self-pay | Admitting: *Deleted

## 2015-09-08 ENCOUNTER — Emergency Department (HOSPITAL_COMMUNITY): Payer: Medicare Other

## 2015-09-08 ENCOUNTER — Telehealth: Payer: Self-pay | Admitting: Family Medicine

## 2015-09-08 ENCOUNTER — Emergency Department (HOSPITAL_COMMUNITY)
Admission: EM | Admit: 2015-09-08 | Discharge: 2015-09-08 | Disposition: A | Discharge status: Home / Self Care | Payer: Medicare Other | Attending: Emergency Medicine | Admitting: Emergency Medicine

## 2015-09-08 DIAGNOSIS — I2510 Atherosclerotic heart disease of native coronary artery without angina pectoris: Secondary | ICD-10-CM | POA: Diagnosis not present

## 2015-09-08 DIAGNOSIS — I252 Old myocardial infarction: Secondary | ICD-10-CM | POA: Diagnosis not present

## 2015-09-08 DIAGNOSIS — E785 Hyperlipidemia, unspecified: Secondary | ICD-10-CM | POA: Insufficient documentation

## 2015-09-08 DIAGNOSIS — E669 Obesity, unspecified: Secondary | ICD-10-CM | POA: Diagnosis not present

## 2015-09-08 DIAGNOSIS — Z7982 Long term (current) use of aspirin: Secondary | ICD-10-CM | POA: Diagnosis not present

## 2015-09-08 DIAGNOSIS — R42 Dizziness and giddiness: Secondary | ICD-10-CM | POA: Insufficient documentation

## 2015-09-08 DIAGNOSIS — J439 Emphysema, unspecified: Secondary | ICD-10-CM

## 2015-09-08 DIAGNOSIS — Z87891 Personal history of nicotine dependence: Secondary | ICD-10-CM | POA: Insufficient documentation

## 2015-09-08 DIAGNOSIS — M109 Gout, unspecified: Secondary | ICD-10-CM | POA: Insufficient documentation

## 2015-09-08 DIAGNOSIS — R55 Syncope and collapse: Secondary | ICD-10-CM | POA: Diagnosis not present

## 2015-09-08 DIAGNOSIS — Z79899 Other long term (current) drug therapy: Secondary | ICD-10-CM | POA: Diagnosis not present

## 2015-09-08 DIAGNOSIS — Z8619 Personal history of other infectious and parasitic diseases: Secondary | ICD-10-CM | POA: Insufficient documentation

## 2015-09-08 DIAGNOSIS — I10 Essential (primary) hypertension: Secondary | ICD-10-CM | POA: Diagnosis not present

## 2015-09-08 DIAGNOSIS — R0602 Shortness of breath: Secondary | ICD-10-CM | POA: Diagnosis present

## 2015-09-08 HISTORY — DX: Pulmonary fibrosis, unspecified: J84.10

## 2015-09-08 LAB — BASIC METABOLIC PANEL
ANION GAP: 10 (ref 5–15)
BUN: 30 mg/dL — ABNORMAL HIGH (ref 6–20)
CALCIUM: 9.1 mg/dL (ref 8.9–10.3)
CO2: 28 mmol/L (ref 22–32)
CREATININE: 2.16 mg/dL — AB (ref 0.61–1.24)
Chloride: 101 mmol/L (ref 101–111)
GFR, EST AFRICAN AMERICAN: 30 mL/min — AB (ref >60)
GFR, EST NON AFRICAN AMERICAN: 26 mL/min — AB (ref >60)
Glucose, Bld: 119 mg/dL — ABNORMAL HIGH (ref 65–99)
Potassium: 4.7 mmol/L (ref 3.5–5.1)
SODIUM: 139 mmol/L (ref 135–145)

## 2015-09-08 LAB — CBC
HEMATOCRIT: 42.7 % (ref 39.0–52.0)
HEMOGLOBIN: 13.5 g/dL (ref 13.0–17.0)
MCH: 26.7 pg (ref 26.0–34.0)
MCHC: 31.6 g/dL (ref 30.0–36.0)
MCV: 84.4 fL (ref 78.0–100.0)
Platelets: 128 10*3/uL — ABNORMAL LOW (ref 150–400)
RBC: 5.06 MIL/uL (ref 4.22–5.81)
RDW: 15.2 % (ref 11.5–15.5)
WBC: 8.2 10*3/uL (ref 4.0–10.5)

## 2015-09-08 LAB — BRAIN NATRIURETIC PEPTIDE: B NATRIURETIC PEPTIDE 5: 109.1 pg/mL — AB (ref 0.0–100.0)

## 2015-09-08 LAB — TROPONIN I

## 2015-09-08 MED ORDER — IPRATROPIUM-ALBUTEROL 0.5-2.5 (3) MG/3ML IN SOLN
3.0000 mL | Freq: Once | RESPIRATORY_TRACT | Status: AC
  Administered 2015-09-08: 3 mL via RESPIRATORY_TRACT
  Filled 2015-09-08: qty 3

## 2015-09-08 NOTE — ED Provider Notes (Signed)
CSN: [id number]     Arrival date & time 09/08/15  1500 History   First MD Initiated Contact with Patient 09/08/15 1653     Chief Complaint  Patient presents with  . Shortness of Breath  . Near Syncope     (Consider location/radiation/quality/duration/timing/severity/associated sxs/prior Treatment) HPI Patient states that he went to see his family doctor at the [place] 3 days ago for routine checkup. He reports he was feeling fine when he went to the doctor. While there they noted that his oxygen level was staying low and that there were "gurgling" sounds in his lungs. Due to these findings he reports they sent him directly to the hospital at [facility]. While there he describes having cardiac ultrasound and other tests done. He reports they told him there wasn't anything really wrong with his heart. Nonetheless is oxygen level did not increase, so he was discharged on home oxygen. Ports there were also a number of new medications prescribed. Today, he felt more fatigued than previously and had a decreased appetite. Nonetheless he did get up at his wife's request and eat a biscuit and have a cup of tea. When he stood up to walk he felt lightheaded and dizzy. They called the provider's office to get the medications filled that have been prescribed from his hospitalization (he was [facility] from [facility] yesterday). He reports while on the telephone, they told him that he "sounded terrible" and that the medications could not be filled on outpatient basis and he should be seen at the emergency department.  Past Medical History  Diagnosis Date  . Hyperlipidemia   . Hypertension   . Abdominal aortic aneurysm     with endoleak per Dr. Janetta Hora, aneurysmal sac size stable as of 12/11 (7.5 x 6.1cm)  . Benign prostatic hypertrophy   . History of chickenpox   . Dysuria   . Heart disease   . Gout   . Sigmoid diverticulosis   . Melanoma in situ     Per [facility] 2014  . MI  (myocardial infarction)   . Coronary artery disease   . Pulmonary fibrosis   . Emphysema (subcutaneous) (surgical) resulting from a procedure    Past Surgical History  Procedure Laterality Date  . Hernia repair  2011    Right  . Coronary artery bypass graft  1991  . Bilateral cataract surgery  2011  . Aaa stent  2008  . Left cea  1997   Family History  Problem Relation Age of Onset  . Stroke Mother   . Heart disease Father 56    Sudden death  . Heart attack Father    Social History  Substance Use Topics  . Smoking status: Former Smoker -- 1.00 packs/day for 40 years    Types: Cigarettes    Quit date: 12/23/1988  . Smokeless tobacco: Never Used  . Alcohol Use: Yes     Comment: WINE 1-2 TIMES WEEKLY AND BEER OCCASSIONALLY    Review of Systems  10 Systems reviewed and are negative for acute change except as noted in the HPI.   Allergies  Review of patient's allergies indicates no known allergies.  Home Medications   Prior to Admission medications   Medication Sig Start Date End Date Taking? Authorizing Provider  allopurinol (ZYLOPRIM) 300 MG tablet Take 300 mg by mouth daily.   Yes Historical Provider, MD  aspirin 81 MG tablet Take 81 mg by mouth daily.   Yes Historical Provider, MD  atorvastatin (LIPITOR)  20 MG tablet Take 1 tablet (20 mg total) by mouth daily at 6 PM. 05/13/12  Yes Zannie Cove, MD  cholecalciferol (VITAMIN D) 1000 UNITS tablet Take 1,000 Units by mouth daily.    Yes Historical Provider, MD  diltiazem (TIAZAC) 300 MG 24 hr capsule Take 300 mg by mouth daily.   Yes Historical Provider, MD  doxazosin (CARDURA) 8 MG tablet Take 8 mg by mouth at bedtime.   Yes Historical Provider, MD  dutasteride (AVODART) 0.5 MG capsule Take 0.5 mg by mouth at bedtime.    Yes Historical Provider, MD  losartan (COZAAR) 50 MG tablet Take 50 mg by mouth daily.    Yes Historical Provider, MD  Skin Protectants, Misc. (EUCERIN) cream Apply 1 application topically as needed  for dry skin (for arms and legs).   Yes Historical Provider, MD  vitamin C (ASCORBIC ACID) 500 MG tablet Take 1,000 mg by mouth daily.    Yes Historical Provider, MD  vitamin E 1000 UNIT capsule Take 1,000 Units by mouth daily.   Yes Historical Provider, MD  albuterol (PROVENTIL HFA;VENTOLIN HFA) 108 (90 BASE) MCG/ACT inhaler Inhale 2 puffs into the lungs every 6 (six) hours as needed for wheezing or shortness of breath. 05/13/12   Zannie Cove, MD  clotrimazole-betamethasone (LOTRISONE) cream APPLY TO AFFECTED AREA TWICE DAILY AS NEEDED 11/16/12   [name], MD  furosemide (LASIX) 40 MG tablet Take 40 mg by mouth daily as needed for fluid or edema (or for shortness of breath).    Historical Provider, MD  magnesium chloride (SLOW-MAG) 64 MG TBEC SR tablet Take 1 tablet by mouth 2 (two) times daily.    Historical Provider, MD  potassium chloride SA (K-DUR,KLOR-CON) 20 MEQ tablet Take 20 mEq by mouth daily as needed (takes with furosemide).    Historical Provider, MD   BP 113/46 mmHg  Pulse 63  Temp(Src) 98 F (36.7 C) (Oral)  Resp 18  Ht 5\' 6"  (1.676 m)  Wt 184 lb 6.4 oz (83.643 kg)  BMI 29.78 kg/m2  SpO2 91% Physical Exam  Constitutional: He is oriented to person, place, and time.  Patient is a mildly obese, deconditioned elderly male. He is alert and nontoxic. Speech is clear and mental status is normal.  HENT:  Head: Normocephalic and atraumatic.  Eyes: EOM are normal. Pupils are equal, round, and reactive to light. No scleral icterus.  Neck: Neck supple.  Cardiovascular: Normal rate, regular rhythm, normal heart sounds and intact distal pulses.   Pulmonary/Chest: Effort normal.  Breath sounds are somewhat soft at the bases. There is no gross wheeze rhonchi or rail. Patient has has mild increased work of breathing on nasal cannula oxygen.  Abdominal: Soft. Bowel sounds are normal. He exhibits no distension. There is no tenderness.  Musculoskeletal: Normal range of motion. He  exhibits edema.  2+ pitting edema bilateral lower extremities. (Patient reports this is been about normal for him for the past year) several shallow, healing ulcerative lesions on his left lower extremity. (He reports these are areas that were treated by the dermatologist for skin cancer).  Neurological: He is alert and oriented to person, place, and time. He has normal strength. Coordination normal. GCS eye subscore is 4. GCS verbal subscore is 5. GCS motor subscore is 6.  Skin: Skin is warm, dry and intact.  Psychiatric: He has a normal mood and affect.    ED Course  Procedures (including critical care time) Labs Review Labs Reviewed  CBC - Abnormal;  Notable for the following:    Platelets 128 (*)    All other components within normal limits  BASIC METABOLIC PANEL - Abnormal; Notable for the following:    Glucose, Bld 119 (*)    BUN 30 (*)    Creatinine, Ser 2.16 (*)    GFR calc non Af Amer 26 (*)    GFR calc Af Amer 30 (*)    All other components within normal limits  BRAIN NATRIURETIC PEPTIDE - Abnormal; Notable for the following:    B Natriuretic Peptide 109.1 (*)    All other components within normal limits  TROPONIN I  URINALYSIS, ROUTINE W REFLEX MICROSCOPIC (NOT AT [facility])    Imaging Review Dg Chest 2 View  09/08/2015   CLINICAL DATA:  Shortness of breath with bilateral lower extremity swelling 5 days.  EXAM: CHEST  2 VIEW  COMPARISON:  05/12/2012  FINDINGS: Sternotomy wires unchanged. Lungs are adequately inflated without focal consolidation or effusion. Mild emphysematous disease. Cardiac silhouette is within normal. There is moderate calcified plaque over the aortic arch. There are degenerative changes of the spine. Findings suggesting an old right humeral neck fracture. Multiple old right posterior rib fractures.  IMPRESSION: No acute cardiopulmonary disease.  Emphysematous disease.   Electronically Signed   By: Elberta Fortis M.D.   On: 09/08/2015 18:16   I have personally  reviewed and evaluated these images and lab results as part of my medical decision-making.   EKG Interpretation   Date/Time:  Friday September 08 2015 15:23:20 EDT Ventricular Rate:  71 PR Interval:  146 QRS Duration: 82 QT Interval:  404 QTC Calculation: 439 R Axis:   71 Text Interpretation:  Normal sinus rhythm Septal infarct , age  undetermined Abnormal ECG agree. no change from previous. Confirmed by  Donnald Garre, MD, Lebron Conners [phone]) on 09/08/2015 6:35:44 PM     Recheck at 20:10 patient is well in appearance. He has no respiratory distress. Vital signs are stable. I have tested him off of oxygen. Off of oxygen the patient's O2 ranges from 88% to 91%. With several deep inspirations the patient is able to self oxygenate up to 94%. MDM   Final diagnoses:  Pulmonary emphysema, unspecified emphysema type  Orthostatic dizziness   At this point I think the patient's baseline hypoxia is predominantly a factor of emphysema. He has also been treated for mild CHF while hospitalized at [facility]. He was diuresed and his creatinine is mildly elevated. At this point I do suspect the dizziness and fatigue experienced today were due to mild hypovolemia after diuresis. He is otherwise well in appearance without fever or cough. Off of oxygen the patient is able to self oxygenate with deep inspiration to the mid 90s.     Arby Barrette, MD 09/08/15 2026

## 2015-09-08 NOTE — Discharge Instructions (Signed)
Chronic Obstructive Pulmonary Disease Exacerbation Chronic obstructive pulmonary disease (COPD) is a common lung condition in which airflow from the lungs is limited. COPD is a general term that can be used to describe many different lung problems that limit airflow, including chronic bronchitis and emphysema. COPD exacerbations are episodes when breathing symptoms become much worse and require extra treatment. Without treatment, COPD exacerbations can be life threatening, and frequent COPD exacerbations can cause further damage to your lungs. CAUSES   Respiratory infections.   Exposure to smoke.   Exposure to air pollution, chemical fumes, or dust. Sometimes there is no apparent cause or trigger. RISK FACTORS  Smoking cigarettes.  Older age.  Frequent prior COPD exacerbations. SIGNS AND SYMPTOMS   Increased coughing.   Increased thick spit (sputum) production.   Increased wheezing.   Increased shortness of breath.   Rapid breathing.   Chest tightness. DIAGNOSIS  Your medical history, a physical exam, and tests will help your health care provider make a diagnosis. Tests may include:  A chest X-ray.  Basic lab tests.  Sputum testing.  An arterial blood gas test. TREATMENT  Depending on the severity of your COPD exacerbation, you may need to be admitted to a hospital for treatment. Some of the treatments commonly used to treat COPD exacerbations are:   Antibiotic medicines.   Bronchodilators. These are drugs that expand the air passages. They may be given with an inhaler or nebulizer. Spacer devices may be needed to help improve drug delivery.  Corticosteroid medicines.  Supplemental oxygen therapy.  HOME CARE INSTRUCTIONS   Do not smoke. Quitting smoking is very important to prevent COPD from getting worse and exacerbations from happening as often.  Avoid exposure to all substances that irritate the airway, especially to tobacco smoke.   If you were  prescribed an antibiotic medicine, finish it all even if you start to feel better.  Take all medicines as directed by your health care provider.It is important to use correct technique with inhaled medicines.  Drink enough fluids to keep your urine clear or pale yellow (unless you have a medical condition that requires fluid restriction).  Use a cool mist vaporizer. This makes it easier to clear your chest when you cough.   If you have a home nebulizer and oxygen, continue to use them as directed.   Maintain all necessary vaccinations to prevent infections.   Exercise regularly.   Eat a healthy diet.   Keep all follow-up appointments as directed by your health care provider. SEEK IMMEDIATE MEDICAL CARE IF:  You have worsening shortness of breath.   You have trouble talking.   You have severe chest pain.  You have blood in your sputum.  You have a fever.  You have weakness, vomit repeatedly, or faint.   You feel confused.   You continue to get worse. MAKE SURE YOU:   Understand these instructions.  Will watch your condition.  Will get help right away if you are not doing well or get worse. Document Released: 10/06/2007 Document Revised: 04/25/2014 Document Reviewed: 08/13/2013 [facility] Patient Information 2015 [place], [place]. This information is not intended to replace advice given to you by your health care provider. Make sure you discuss any questions you have with your health care provider.  Weakness Weakness is a lack of strength. It may be felt all over the body (generalized) or in one specific part of the body (focal). Some causes of weakness can be serious. You may need further medical evaluation, especially  if you are elderly or you have a history of immunosuppression (such as chemotherapy or HIV), kidney disease, heart disease, or diabetes. CAUSES  Weakness can be caused by many different things, including:  Infection.  Physical  exhaustion.  Internal bleeding or other blood loss that results in a lack of red blood cells (anemia).  Dehydration. This cause is more common in elderly people.  Side effects or electrolyte abnormalities from medicines, such as pain medicines or sedatives.  Emotional distress, anxiety, or depression.  Circulation problems, especially severe peripheral arterial disease.  Heart disease, such as rapid atrial fibrillation, bradycardia, or heart failure.  Nervous system disorders, such as Guillain-Barr syndrome, multiple sclerosis, or stroke. DIAGNOSIS  To find the cause of your weakness, your caregiver will take your history and perform a physical exam. Lab tests or X-rays may also be ordered, if needed. TREATMENT  Treatment of weakness depends on the cause of your symptoms and can vary greatly. HOME CARE INSTRUCTIONS   Rest as needed.  Eat a well-balanced diet.  Try to get some exercise every day.  Only take over-the-counter or prescription medicines as directed by your caregiver. SEEK MEDICAL CARE IF:   Your weakness seems to be getting worse or spreads to other parts of your body.  You develop new aches or pains. SEEK IMMEDIATE MEDICAL CARE IF:   You cannot perform your normal daily activities, such as getting dressed and feeding yourself.  You cannot walk up and down stairs, or you feel exhausted when you do so.  You have shortness of breath or chest pain.  You have difficulty moving parts of your body.  You have weakness in only one area of the body or on only one side of the body.  You have a fever.  You have trouble speaking or swallowing.  You cannot control your bladder or bowel movements.  You have black or bloody vomit or stools. MAKE SURE YOU:  Understand these instructions.  Will watch your condition.  Will get help right away if you are not doing well or get worse. Document Released: 12/09/2005 Document Revised: 06/09/2012 Document Reviewed:  02/07/2012 [facility] Patient Information 2015 [place], [place]. This information is not intended to replace advice given to you by your health care provider. Make sure you discuss any questions you have with your health care provider.

## 2015-09-08 NOTE — ED Notes (Signed)
Called triage, asked if paper work for pt was left behind. No paper work was found.

## 2015-09-08 NOTE — ED Notes (Signed)
Pt was discharged from [facility] yesterday after being tx for "low oxygen".  Sats of 89 - 91% on RA, but is supposed to be on 1L since discharge.  Pt states he was trying to get his meds filled, but the [place] stated he needed to come to ED b/c he was feeling dizzy and almost passing out when he tried to ambulate today.

## 2015-09-08 NOTE — ED Notes (Signed)
Discharge instructions reviewed with patient/daughter. Understanding verbalized. Denies pain. No distress noted at time of discharge.

## 2015-09-08 NOTE — Telephone Encounter (Signed)
Please call pt to make sure he has f/u scheduled.  Was at [facility].  See care everywhere notes.  Thanks.

## 2015-09-11 ENCOUNTER — Ambulatory Visit (INDEPENDENT_AMBULATORY_CARE_PROVIDER_SITE_OTHER): Payer: Medicare Other | Admitting: Family Medicine

## 2015-09-11 ENCOUNTER — Encounter: Payer: Self-pay | Admitting: Family Medicine

## 2015-09-11 VITALS — BP 130/60 | HR 68 | Temp 98.3°F | Wt 182.2 lb

## 2015-09-11 DIAGNOSIS — I5032 Chronic diastolic (congestive) heart failure: Secondary | ICD-10-CM

## 2015-09-11 DIAGNOSIS — E041 Nontoxic single thyroid nodule: Secondary | ICD-10-CM | POA: Diagnosis not present

## 2015-09-11 DIAGNOSIS — R911 Solitary pulmonary nodule: Secondary | ICD-10-CM

## 2015-09-11 DIAGNOSIS — Q6100 Congenital renal cyst, unspecified: Secondary | ICD-10-CM | POA: Diagnosis not present

## 2015-09-11 DIAGNOSIS — J439 Emphysema, unspecified: Secondary | ICD-10-CM

## 2015-09-11 DIAGNOSIS — R0602 Shortness of breath: Secondary | ICD-10-CM

## 2015-09-11 DIAGNOSIS — N281 Cyst of kidney, acquired: Secondary | ICD-10-CM

## 2015-09-11 MED ORDER — POTASSIUM CHLORIDE CRYS ER 20 MEQ PO TBCR: 20.0000 meq | EXTENDED_RELEASE_TABLET | Freq: Every day | ORAL | Status: DC | PRN

## 2015-09-11 MED ORDER — FUROSEMIDE 40 MG PO TABS: 40.0000 mg | ORAL_TABLET | Freq: Every day | ORAL | Status: DC | PRN

## 2015-09-11 NOTE — Progress Notes (Signed)
Pre visit review using our clinic review tool, if applicable. No additional management support is needed unless otherwise documented below in the visit note.  Mult issues.   Was seen at outside clinic, sent to [facility] with SOB.   Here for followup.   L renal cyst incidentally noted on imaging.  Likely not significant, d/w pt.  We can f/u later.  He agrees.   Thyroid nodule incidentally noted on imaging.  Likely not significant, d/w pt.  We can f/u later.  He agrees.   Pulmonary nodule incidentally noted on imaging.  D/w pt.  He has f/u with pulmonary at the [facility] pending.   [facility]. Would need CT chest in 3 months, either here or elsewhere.    Emphysema.  Sent home on O2, with pulmonary f/u pending.  His breathing is improved with diuresis and O2.  He is clearly improved from admission to [facility].    CHF.  D/w pt.  Diastolic dysfunction path/phys d/w pt along with med use and rationale.  He wanted second opinion from cards.  I had sent him to cards last year.  He is going to ask about getting set up with cards clinic at the [place].  Due for f/u labs.  Weight was 189 at d/c from [facility] per patient, 182 now.  Needs new rx for lasix and K for prn use.  Recently with Cr elevation noted on ER labs.   PMH and SH reviewed  ROS: See HPI, otherwise noncontributory.  Meds, vitals, and allergies reviewed.   nad ncat On O2 Mmm Neck supple, no LA rrr Ctab, no wheeze abd soft, not ttp Ext w/o edema.  Speaking in complete sentences.

## 2015-09-11 NOTE — Telephone Encounter (Signed)
Spoke to patient, condition is fair.  He is now on home oxygen after second ED at [facility] on 9/16.  Follow up scheduled at 4pm today.

## 2015-09-11 NOTE — Patient Instructions (Addendum)
Go to the lab on the way out.  We'll contact you with your lab report. Take lasix as needed for fluid in your legs.  Take the potassium when you take the lasix.  I sent more lasix and potassium to the pharmacy.  Don't change your meds for now.  We can work on checking the thyroid nodule and the kidney cyst later on.  Ask Dr. Shelle Iron about the oxygen orders, getting a heart doctor at the [facility], and follow up on the pulmonary nodules.  Take care.  Glad to see you.

## 2015-09-12 LAB — COMPREHENSIVE METABOLIC PANEL
ALBUMIN: 4 g/dL (ref 3.5–5.2)
ALK PHOS: 67 U/L (ref 39–117)
ALT: 22 U/L (ref 0–53)
AST: 18 U/L (ref 0–37)
BUN: 33 mg/dL — AB (ref 6–23)
CALCIUM: 9.5 mg/dL (ref 8.4–10.5)
CO2: 34 mEq/L — ABNORMAL HIGH (ref 19–32)
Chloride: 101 mEq/L (ref 96–112)
Creatinine, Ser: 1.61 mg/dL — ABNORMAL HIGH (ref 0.40–1.50)
GFR: 43.34 mL/min — AB (ref >60.00)
Glucose, Bld: 120 mg/dL — ABNORMAL HIGH (ref 70–99)
POTASSIUM: 4.1 meq/L (ref 3.5–5.1)
Sodium: 144 mEq/L (ref 135–145)
TOTAL PROTEIN: 7.1 g/dL (ref 6.0–8.3)
Total Bilirubin: 0.5 mg/dL (ref 0.2–1.2)

## 2015-09-12 LAB — CBC WITH DIFFERENTIAL/PLATELET
Basophils Absolute: 0 10*3/uL (ref 0.0–0.1)
Basophils Relative: 0.3 % (ref 0.0–3.0)
EOS PCT: 1.8 % (ref 0.0–5.0)
Eosinophils Absolute: 0.2 10*3/uL (ref 0.0–0.7)
HEMATOCRIT: 42.5 % (ref 39.0–52.0)
HEMOGLOBIN: 13.7 g/dL (ref 13.0–17.0)
LYMPHS PCT: 10.9 % — AB (ref 12.0–46.0)
Lymphs Abs: 1 10*3/uL (ref 0.7–4.0)
MCHC: 32.2 g/dL (ref 30.0–36.0)
MCV: 84.7 fl (ref 78.0–100.0)
MONO ABS: 0.7 10*3/uL (ref 0.1–1.0)
MONOS PCT: 7.1 % (ref 3.0–12.0)
Neutro Abs: 7.4 10*3/uL (ref 1.4–7.7)
Neutrophils Relative %: 79.9 % — ABNORMAL HIGH (ref 43.0–77.0)
Platelets: 161 10*3/uL (ref 150.0–400.0)
RBC: 5.03 Mil/uL (ref 4.22–5.81)
RDW: 16 % — ABNORMAL HIGH (ref 11.5–15.5)
WBC: 9.2 10*3/uL (ref 4.0–10.5)

## 2015-09-12 LAB — BRAIN NATRIURETIC PEPTIDE: PRO B NATRI PEPTIDE: 117 pg/mL — AB (ref 0.0–100.0)

## 2015-09-13 ENCOUNTER — Other Ambulatory Visit: Payer: Self-pay | Admitting: Family Medicine

## 2015-09-13 ENCOUNTER — Encounter: Payer: Self-pay | Admitting: Family Medicine

## 2015-09-13 DIAGNOSIS — E041 Nontoxic single thyroid nodule: Secondary | ICD-10-CM | POA: Insufficient documentation

## 2015-09-13 DIAGNOSIS — I5030 Unspecified diastolic (congestive) heart failure: Secondary | ICD-10-CM | POA: Insufficient documentation

## 2015-09-13 DIAGNOSIS — R911 Solitary pulmonary nodule: Secondary | ICD-10-CM | POA: Insufficient documentation

## 2015-09-13 DIAGNOSIS — I5032 Chronic diastolic (congestive) heart failure: Secondary | ICD-10-CM

## 2015-09-13 DIAGNOSIS — N281 Cyst of kidney, acquired: Secondary | ICD-10-CM | POA: Insufficient documentation

## 2015-09-13 DIAGNOSIS — J439 Emphysema, unspecified: Secondary | ICD-10-CM | POA: Insufficient documentation

## 2015-09-13 NOTE — Assessment & Plan Note (Signed)
Diastolic dysfunction path/phys d/w pt along with med use and rationale. He wanted second opinion from cards. I had sent him to cards last year. He is going to ask about getting set up with cards clinic at the [place]. Due for f/u labs. Weight was 189 at d/c from [facility] per patient, 182 now. Needs new rx for lasix and K for prn use. Recently with Cr elevation noted on ER labs.  Appears euvolemic.  See notes on labs.  >45 minutes spent in face to face time with patient, >50% spent in counselling or coordination of care

## 2015-09-13 NOTE — Assessment & Plan Note (Signed)
Likely dx, incidentally noted on imaging. Likely not significant, d/w pt. We can f/u later. He agrees.

## 2015-09-13 NOTE — Assessment & Plan Note (Signed)
Is incidentally noted on imaging. D/w pt. He has f/u with pulmonary at the [facility] pending. [facility] or [facility]. Would need CT chest in 3 months, either here or elsewhere.

## 2015-09-13 NOTE — Assessment & Plan Note (Signed)
Per pulmonary.  [facility].  Continue O2 for now.

## 2015-09-13 NOTE — Assessment & Plan Note (Signed)
Likely dx, incidentally noted on imaging. Likely not significant, d/w pt. We can f/u later. He agrees.   

## 2015-09-20 ENCOUNTER — Ambulatory Visit (INDEPENDENT_AMBULATORY_CARE_PROVIDER_SITE_OTHER): Payer: Medicare Other | Admitting: Family Medicine

## 2015-09-20 ENCOUNTER — Encounter: Payer: Self-pay | Admitting: Family Medicine

## 2015-09-20 VITALS — BP 106/60 | HR 60 | Temp 98.4°F | Wt 184.0 lb

## 2015-09-20 DIAGNOSIS — R911 Solitary pulmonary nodule: Secondary | ICD-10-CM

## 2015-09-20 DIAGNOSIS — J439 Emphysema, unspecified: Secondary | ICD-10-CM

## 2015-09-20 DIAGNOSIS — I5032 Chronic diastolic (congestive) heart failure: Secondary | ICD-10-CM

## 2015-09-20 DIAGNOSIS — Z23 Encounter for immunization: Secondary | ICD-10-CM | POA: Diagnosis not present

## 2015-09-20 DIAGNOSIS — E041 Nontoxic single thyroid nodule: Secondary | ICD-10-CM | POA: Diagnosis not present

## 2015-09-20 DIAGNOSIS — N281 Cyst of kidney, acquired: Secondary | ICD-10-CM

## 2015-09-20 DIAGNOSIS — Q6100 Congenital renal cyst, unspecified: Secondary | ICD-10-CM | POA: Diagnosis not present

## 2015-09-20 NOTE — Patient Instructions (Addendum)
If Dr. Shelle Iron isn't going to set up your CT scan to recheck the nodules in mid December, then let me know.   [facility] the [facility] of your [facility] and kidneys.   See if you have previously gotten the prevnar-13 (pneumonia shot) at the [place].  If so, then let me know.  If you haven't gotten it, then I would get that at the [place].   Go to the lab on the way out.  We'll contact you with your lab report. Goal weight is 185 for now (in the morning, before you eat).  If above 188 or if more swelling in your ankles, then restart the lasix and potassium.   Take care.  Glad to see you.

## 2015-09-20 NOTE — Progress Notes (Signed)
Pre visit review using our clinic review tool, if applicable. No additional management support is needed unless otherwise documented below in the visit note.  CHF and pulmonary disease.  [facility] with [facility].  Still on O2.  Will [facility] with [facility] for PFTs.   D/w pt about f/u CT.  We can do this if not done by [place].  Weight is up 2 lbs.  He hasn't been on lasix for 4 days.  He takes K only if taking lasix.  D/w pt about goal weight, presumed to be 185 lbs.   He is clearly improved from his condition around the time of hospitalization, was able to walk to the street from his house today.   Prev labs d/w pt. Due for f/u [facility].   D/w pt about follow up thyroid and renal imaging, see AV[facility].   Meds, vitals, and allergies reviewed.   ROS: See HPI.  Otherwise, noncontributory.  nad ncat On O2 Mmm Neck supple, no LA rrr Ctab, no wheeze abd soft, not ttp Ext w/o edema.  Speaking in complete sentences.

## 2015-09-21 LAB — BASIC METABOLIC PANEL
BUN: 32 mg/dL — ABNORMAL HIGH (ref 6–23)
CALCIUM: 9 mg/dL (ref 8.4–10.5)
CO2: 33 mEq/L — ABNORMAL HIGH (ref 19–32)
CREATININE: 1.39 mg/dL (ref 0.40–1.50)
Chloride: 102 mEq/L (ref 96–112)
GFR: 51.35 mL/min — AB (ref >60.00)
GLUCOSE: 118 mg/dL — AB (ref 70–99)
Potassium: 3.5 mEq/L (ref 3.5–5.1)
SODIUM: 144 meq/L (ref 135–145)

## 2015-09-22 NOTE — Assessment & Plan Note (Signed)
Per pulmonary 

## 2015-09-22 NOTE — Assessment & Plan Note (Signed)
He can f/u at the [place], vs here if needed.  D/w pt.  He agrees.

## 2015-09-22 NOTE — Assessment & Plan Note (Signed)
Path/phys d/w pt.  Goal weight is 185 for now.. If above 188 or if more swelling in his ankles, then restart the lasix and potassium.  See notes on labs.  >25 minutes spent in face to face time with patient, >50% spent in counselling or coordination of care.

## 2015-09-22 NOTE — Assessment & Plan Note (Signed)
[facility] the [facility], vs here if needed.  D/w pt.  He agrees.  

## 2015-09-26 ENCOUNTER — Encounter: Payer: Self-pay | Admitting: Family Medicine

## 2015-10-04 ENCOUNTER — Telehealth: Payer: Self-pay | Admitting: Family Medicine

## 2015-10-04 NOTE — Telephone Encounter (Signed)
Call [facility]  D[facility] him re[facility]  He'll f/u with patient re: pulmonary nodules and thyroid/renal lesions.  Goal dry weight still 185 lbs.  Dr. Shelle Ironlance to update me as needed.  I thanked him for the call.

## 2015-10-06 ENCOUNTER — Telehealth: Payer: Self-pay

## 2015-10-06 NOTE — Telephone Encounter (Signed)
Error - duplicate

## 2015-10-06 NOTE — Telephone Encounter (Signed)
Pt request refill K and furosemide to [facility]; [facility] and refills are available and will be ready for pick up at lunch; pt notified and voiced understanding.

## 2015-10-10 ENCOUNTER — Encounter: Payer: Self-pay | Admitting: Family Medicine

## 2015-10-10 ENCOUNTER — Ambulatory Visit (INDEPENDENT_AMBULATORY_CARE_PROVIDER_SITE_OTHER): Payer: Medicare Other | Admitting: Family Medicine

## 2015-10-10 VITALS — BP 158/70 | HR 61 | Temp 98.3°F | Wt 183.0 lb

## 2015-10-10 DIAGNOSIS — I5032 Chronic diastolic (congestive) heart failure: Secondary | ICD-10-CM

## 2015-10-10 DIAGNOSIS — I10 Essential (primary) hypertension: Secondary | ICD-10-CM

## 2015-10-10 MED ORDER — FUROSEMIDE 40 MG PO TABS
ORAL_TABLET | ORAL | Status: DC
Start: 2015-10-10 — End: 2015-10-10

## 2015-10-10 MED ORDER — DILTIAZEM HCL ER BEADS 240 MG PO CP24: 240.0000 mg | ORAL_CAPSULE | Freq: Every day | ORAL | Status: DC

## 2015-10-10 MED ORDER — POTASSIUM CHLORIDE CRYS ER 20 MEQ PO TBCR: EXTENDED_RELEASE_TABLET | ORAL | Status: DC

## 2015-10-10 MED ORDER — FUROSEMIDE 40 MG PO TABS: ORAL_TABLET | ORAL | Status: DC

## 2015-10-10 NOTE — Progress Notes (Signed)
Pre visit review using our clinic review tool, if applicable. No additional management support is needed unless otherwise documented below in the visit note.  CHF.  Has been off lasix/K for the last week.  [place] [facility] [place].  Goal weight <185.  D/w pt about dry weight goal.  He is sad/depressed about his dx of CHF and pulmonary disease, with O2 dependence affecting his ADLs, IADLs, etc.  No SI/HI.  D/w pt.  His mood isn't to the point of needing medical treatment yet, but he'll update me if his mood worsens.    D/w pt about [facility] labs and goals with treatment.  He'll need f/u labs in about 3 weeks.  D/w pt.    Meds, vitals, and allergies reviewed.   ROS: See HPI.  Otherwise, noncontributory.  nad On O2 Speech wnl Appears sad initially but then regains composure Neck supple No LA rrr ctab w/o focal dec in BS abd soft Ext w/o edema

## 2015-10-10 NOTE — Patient Instructions (Addendum)
Recheck labs here in about 3 weeks.   You don't have to fast.  Take in the AM if your weight is above 185 pounds Take potassium with the lasix.   Take care.  Glad to see you.

## 2015-10-12 NOTE — Assessment & Plan Note (Signed)
Has been off lasix/K for the last week.  Goal weight <185.  D/w pt about dry weight goal.  D/w pt about [facility] labs and goals with treatment.  He'll need f/u labs in about 3 weeks.  [place]. For now, take lasix in the AM if weight is above 185 pounds.  Take potassium with the lasix.   He is sad/depressed about his dx of CHF and pulmonary disease, with O2 dependence affecting his ADLs, IADLs, etc.  No SI/HI.  D/w pt.  His mood isn't to the point of needing medical treatment yet, but he'll update me if his mood worsens.  Still okay for outpatient fu.  .>25 minutes spent in face to face time with patient, >50% spent in counselling or coordination of care.

## 2015-10-31 ENCOUNTER — Other Ambulatory Visit (INDEPENDENT_AMBULATORY_CARE_PROVIDER_SITE_OTHER): Payer: Medicare Other

## 2015-10-31 DIAGNOSIS — I10 Essential (primary) hypertension: Secondary | ICD-10-CM | POA: Diagnosis not present

## 2015-10-31 LAB — BASIC METABOLIC PANEL
BUN: 19 mg/dL (ref 6–23)
CHLORIDE: 99 meq/L (ref 96–112)
CO2: 33 meq/L — AB (ref 19–32)
Calcium: 9.3 mg/dL (ref 8.4–10.5)
Creatinine, Ser: 1.11 mg/dL (ref 0.40–1.50)
GFR: 66.55 mL/min (ref >60.00)
Glucose, Bld: 118 mg/dL — ABNORMAL HIGH (ref 70–99)
POTASSIUM: 3.5 meq/L (ref 3.5–5.1)
Sodium: 142 mEq/L (ref 135–145)

## 2016-05-18 ENCOUNTER — Inpatient Hospital Stay (HOSPITAL_COMMUNITY): Payer: Medicare Other

## 2016-05-18 ENCOUNTER — Emergency Department (HOSPITAL_COMMUNITY): Payer: Medicare Other

## 2016-05-18 ENCOUNTER — Emergency Department (HOSPITAL_COMMUNITY): Payer: Medicare Other | Admitting: Anesthesiology

## 2016-05-18 ENCOUNTER — Encounter (HOSPITAL_COMMUNITY)
Admission: EM | Disposition: A | Discharge status: Skilled Nursing Facility | Payer: Self-pay | Source: Home / Self Care | Attending: Internal Medicine

## 2016-05-18 ENCOUNTER — Inpatient Hospital Stay (HOSPITAL_COMMUNITY)
Admission: EM | Admit: 2016-05-18 | Discharge: 2016-05-23 | DRG: 480 | Disposition: A | Discharge status: Skilled Nursing Facility | Payer: Medicare Other | Attending: Internal Medicine | Admitting: Internal Medicine

## 2016-05-18 ENCOUNTER — Encounter (HOSPITAL_COMMUNITY): Payer: Self-pay | Admitting: Family Medicine

## 2016-05-18 DIAGNOSIS — I2510 Atherosclerotic heart disease of native coronary artery without angina pectoris: Secondary | ICD-10-CM | POA: Diagnosis present

## 2016-05-18 DIAGNOSIS — I739 Peripheral vascular disease, unspecified: Secondary | ICD-10-CM | POA: Diagnosis present

## 2016-05-18 DIAGNOSIS — Z951 Presence of aortocoronary bypass graft: Secondary | ICD-10-CM

## 2016-05-18 DIAGNOSIS — N400 Enlarged prostate without lower urinary tract symptoms: Secondary | ICD-10-CM | POA: Diagnosis present

## 2016-05-18 DIAGNOSIS — S72002A Fracture of unspecified part of neck of left femur, initial encounter for closed fracture: Secondary | ICD-10-CM | POA: Diagnosis not present

## 2016-05-18 DIAGNOSIS — E785 Hyperlipidemia, unspecified: Secondary | ICD-10-CM | POA: Diagnosis present

## 2016-05-18 DIAGNOSIS — J9610 Chronic respiratory failure, unspecified whether with hypoxia or hypercapnia: Secondary | ICD-10-CM | POA: Diagnosis present

## 2016-05-18 DIAGNOSIS — Z6829 Body mass index (BMI) 29.0-29.9, adult: Secondary | ICD-10-CM | POA: Diagnosis not present

## 2016-05-18 DIAGNOSIS — Z7982 Long term (current) use of aspirin: Secondary | ICD-10-CM

## 2016-05-18 DIAGNOSIS — I5033 Acute on chronic diastolic (congestive) heart failure: Secondary | ICD-10-CM | POA: Diagnosis not present

## 2016-05-18 DIAGNOSIS — I252 Old myocardial infarction: Secondary | ICD-10-CM | POA: Diagnosis not present

## 2016-05-18 DIAGNOSIS — W1839XA Other fall on same level, initial encounter: Secondary | ICD-10-CM | POA: Diagnosis present

## 2016-05-18 DIAGNOSIS — J9601 Acute respiratory failure with hypoxia: Secondary | ICD-10-CM | POA: Diagnosis not present

## 2016-05-18 DIAGNOSIS — R42 Dizziness and giddiness: Secondary | ICD-10-CM | POA: Diagnosis present

## 2016-05-18 DIAGNOSIS — N179 Acute kidney failure, unspecified: Secondary | ICD-10-CM | POA: Diagnosis not present

## 2016-05-18 DIAGNOSIS — I10 Essential (primary) hypertension: Secondary | ICD-10-CM | POA: Diagnosis not present

## 2016-05-18 DIAGNOSIS — M25552 Pain in left hip: Secondary | ICD-10-CM | POA: Diagnosis present

## 2016-05-18 DIAGNOSIS — D62 Acute posthemorrhagic anemia: Secondary | ICD-10-CM | POA: Diagnosis not present

## 2016-05-18 DIAGNOSIS — R011 Cardiac murmur, unspecified: Secondary | ICD-10-CM | POA: Diagnosis present

## 2016-05-18 DIAGNOSIS — J8410 Pulmonary fibrosis, unspecified: Secondary | ICD-10-CM | POA: Diagnosis present

## 2016-05-18 DIAGNOSIS — I110 Hypertensive heart disease with heart failure: Secondary | ICD-10-CM | POA: Diagnosis present

## 2016-05-18 DIAGNOSIS — J441 Chronic obstructive pulmonary disease with (acute) exacerbation: Secondary | ICD-10-CM | POA: Diagnosis not present

## 2016-05-18 DIAGNOSIS — I959 Hypotension, unspecified: Secondary | ICD-10-CM | POA: Diagnosis not present

## 2016-05-18 DIAGNOSIS — S72142A Displaced intertrochanteric fracture of left femur, initial encounter for closed fracture: Principal | ICD-10-CM | POA: Diagnosis present

## 2016-05-18 DIAGNOSIS — Z87891 Personal history of nicotine dependence: Secondary | ICD-10-CM

## 2016-05-18 DIAGNOSIS — E875 Hyperkalemia: Secondary | ICD-10-CM | POA: Diagnosis not present

## 2016-05-18 DIAGNOSIS — E669 Obesity, unspecified: Secondary | ICD-10-CM | POA: Diagnosis present

## 2016-05-18 DIAGNOSIS — J449 Chronic obstructive pulmonary disease, unspecified: Secondary | ICD-10-CM | POA: Diagnosis present

## 2016-05-18 DIAGNOSIS — J439 Emphysema, unspecified: Secondary | ICD-10-CM | POA: Diagnosis present

## 2016-05-18 DIAGNOSIS — Z419 Encounter for procedure for purposes other than remedying health state, unspecified: Secondary | ICD-10-CM

## 2016-05-18 DIAGNOSIS — Z9981 Dependence on supplemental oxygen: Secondary | ICD-10-CM

## 2016-05-18 DIAGNOSIS — G9340 Encephalopathy, unspecified: Secondary | ICD-10-CM | POA: Diagnosis not present

## 2016-05-18 DIAGNOSIS — I5030 Unspecified diastolic (congestive) heart failure: Secondary | ICD-10-CM | POA: Diagnosis present

## 2016-05-18 DIAGNOSIS — R600 Localized edema: Secondary | ICD-10-CM

## 2016-05-18 DIAGNOSIS — Z95828 Presence of other vascular implants and grafts: Secondary | ICD-10-CM

## 2016-05-18 DIAGNOSIS — S72009A Fracture of unspecified part of neck of unspecified femur, initial encounter for closed fracture: Secondary | ICD-10-CM | POA: Insufficient documentation

## 2016-05-18 DIAGNOSIS — Z01810 Encounter for preprocedural cardiovascular examination: Secondary | ICD-10-CM

## 2016-05-18 DIAGNOSIS — D649 Anemia, unspecified: Secondary | ICD-10-CM | POA: Diagnosis not present

## 2016-05-18 DIAGNOSIS — M109 Gout, unspecified: Secondary | ICD-10-CM | POA: Diagnosis present

## 2016-05-18 DIAGNOSIS — R609 Edema, unspecified: Secondary | ICD-10-CM

## 2016-05-18 DIAGNOSIS — K5900 Constipation, unspecified: Secondary | ICD-10-CM | POA: Diagnosis present

## 2016-05-18 DIAGNOSIS — I5032 Chronic diastolic (congestive) heart failure: Secondary | ICD-10-CM | POA: Diagnosis not present

## 2016-05-18 DIAGNOSIS — A419 Sepsis, unspecified organism: Secondary | ICD-10-CM | POA: Diagnosis not present

## 2016-05-18 HISTORY — PX: INTRAMEDULLARY (IM) NAIL INTERTROCHANTERIC: SHX5875

## 2016-05-18 LAB — COMPREHENSIVE METABOLIC PANEL
ALT: 11 U/L — AB (ref 17–63)
AST: 13 U/L — AB (ref 15–41)
Albumin: 2.9 g/dL — ABNORMAL LOW (ref 3.5–5.0)
Alkaline Phosphatase: 61 U/L (ref 38–126)
Anion gap: 6 (ref 5–15)
BUN: 17 mg/dL (ref 6–20)
CHLORIDE: 105 mmol/L (ref 101–111)
CO2: 29 mmol/L (ref 22–32)
CREATININE: 1.12 mg/dL (ref 0.61–1.24)
Calcium: 8.4 mg/dL — ABNORMAL LOW (ref 8.9–10.3)
GFR calc non Af Amer: 57 mL/min — ABNORMAL LOW (ref >60)
Glucose, Bld: 126 mg/dL — ABNORMAL HIGH (ref 65–99)
Potassium: 3.6 mmol/L (ref 3.5–5.1)
SODIUM: 140 mmol/L (ref 135–145)
Total Bilirubin: 0.7 mg/dL (ref 0.3–1.2)
Total Protein: 5.1 g/dL — ABNORMAL LOW (ref 6.5–8.1)

## 2016-05-18 LAB — URINALYSIS, ROUTINE W REFLEX MICROSCOPIC
BILIRUBIN URINE: NEGATIVE
GLUCOSE, UA: NEGATIVE mg/dL
HGB URINE DIPSTICK: NEGATIVE
Ketones, ur: NEGATIVE mg/dL
Leukocytes, UA: NEGATIVE
Nitrite: NEGATIVE
PH: 6.5 (ref 5.0–8.0)
Protein, ur: >300 mg/dL — AB
SPECIFIC GRAVITY, URINE: 1.019 (ref 1.005–1.030)

## 2016-05-18 LAB — I-STAT TROPONIN, ED: Troponin i, poc: 0.02 ng/mL (ref 0.00–0.08)

## 2016-05-18 LAB — CBC WITH DIFFERENTIAL/PLATELET
Basophils Absolute: 0 10*3/uL (ref 0.0–0.1)
Basophils Relative: 0 %
EOS ABS: 0.1 10*3/uL (ref 0.0–0.7)
Eosinophils Relative: 1 %
HEMATOCRIT: 35.8 % — AB (ref 39.0–52.0)
HEMOGLOBIN: 11 g/dL — AB (ref 13.0–17.0)
LYMPHS ABS: 0.7 10*3/uL (ref 0.7–4.0)
LYMPHS PCT: 8 %
MCH: 26.4 pg (ref 26.0–34.0)
MCHC: 30.7 g/dL (ref 30.0–36.0)
MCV: 86.1 fL (ref 78.0–100.0)
MONOS PCT: 8 %
Monocytes Absolute: 0.7 10*3/uL (ref 0.1–1.0)
NEUTROS PCT: 83 %
Neutro Abs: 7.4 10*3/uL (ref 1.7–7.7)
Platelets: 137 10*3/uL — ABNORMAL LOW (ref 150–400)
RBC: 4.16 MIL/uL — ABNORMAL LOW (ref 4.22–5.81)
RDW: 15 % (ref 11.5–15.5)
WBC: 8.8 10*3/uL (ref 4.0–10.5)

## 2016-05-18 LAB — URINE MICROSCOPIC-ADD ON

## 2016-05-18 SURGERY — FIXATION, FRACTURE, INTERTROCHANTERIC, WITH INTRAMEDULLARY ROD
Anesthesia: General | Site: Hip | Laterality: Left

## 2016-05-18 MED ORDER — ACETAMINOPHEN 325 MG PO TABS
650.0000 mg | ORAL_TABLET | Freq: Four times a day (QID) | ORAL | Status: DC | PRN
Start: 2016-05-18 — End: 2016-05-23

## 2016-05-18 MED ORDER — ALUM & MAG HYDROXIDE-SIMETH 200-200-20 MG/5ML PO SUSP
30.0000 mL | ORAL | Status: DC | PRN
  Administered 2016-05-20 – 2016-05-23 (×2): 30 mL via ORAL
  Filled 2016-05-18 (×2): qty 30

## 2016-05-18 MED ORDER — ONDANSETRON HCL 4 MG/2ML IJ SOLN
INTRAMUSCULAR | Status: AC
  Filled 2016-05-18: qty 2

## 2016-05-18 MED ORDER — SUGAMMADEX SODIUM 200 MG/2ML IV SOLN
INTRAVENOUS | Status: DC | PRN
  Administered 2016-05-18: 200 mg via INTRAVENOUS

## 2016-05-18 MED ORDER — POLYETHYLENE GLYCOL 3350 17 G PO PACK
17.0000 g | PACK | Freq: Every day | ORAL | Status: DC | PRN
  Administered 2016-05-20: 17 g via ORAL
  Filled 2016-05-18: qty 1

## 2016-05-18 MED ORDER — PHENYLEPHRINE HCL 10 MG/ML IJ SOLN
INTRAMUSCULAR | Status: DC | PRN
  Administered 2016-05-18: 160 ug via INTRAVENOUS

## 2016-05-18 MED ORDER — ZOLPIDEM TARTRATE 5 MG PO TABS: 5.0000 mg | ORAL_TABLET | Freq: Every evening | ORAL | Status: DC | PRN

## 2016-05-18 MED ORDER — FENTANYL CITRATE (PF) 250 MCG/5ML IJ SOLN
INTRAMUSCULAR | Status: AC
  Filled 2016-05-18: qty 5

## 2016-05-18 MED ORDER — HYDRALAZINE HCL 20 MG/ML IJ SOLN: 10.0000 mg | Freq: Four times a day (QID) | INTRAMUSCULAR | Status: DC | PRN

## 2016-05-18 MED ORDER — LACTATED RINGERS IV SOLN
INTRAVENOUS | Status: DC | PRN
  Administered 2016-05-18: 17:00:00 via INTRAVENOUS

## 2016-05-18 MED ORDER — DOCUSATE SODIUM 100 MG PO CAPS
200.0000 mg | ORAL_CAPSULE | Freq: Every day | ORAL | Status: DC
  Administered 2016-05-19: 200 mg via ORAL
  Filled 2016-05-18 (×2): qty 2

## 2016-05-18 MED ORDER — HYDROCODONE-ACETAMINOPHEN 5-325 MG PO TABS: 1.0000 | ORAL_TABLET | Freq: Four times a day (QID) | ORAL | Status: DC | PRN

## 2016-05-18 MED ORDER — PROPOFOL 10 MG/ML IV BOLUS
INTRAVENOUS | Status: DC | PRN
  Administered 2016-05-18: 80 mg via INTRAVENOUS

## 2016-05-18 MED ORDER — DUTASTERIDE 0.5 MG PO CAPS
0.5000 mg | ORAL_CAPSULE | Freq: Every day | ORAL | Status: DC
  Administered 2016-05-18 – 2016-05-22 (×5): 0.5 mg via ORAL
  Filled 2016-05-18 (×5): qty 1

## 2016-05-18 MED ORDER — LIDOCAINE HCL (CARDIAC) 20 MG/ML IV SOLN
INTRAVENOUS | Status: DC | PRN
  Administered 2016-05-18: 20 mg via INTRAVENOUS

## 2016-05-18 MED ORDER — ENOXAPARIN SODIUM 30 MG/0.3ML ~~LOC~~ SOLN
30.0000 mg | SUBCUTANEOUS | Status: DC
Start: 2016-05-19 — End: 2016-05-20
  Administered 2016-05-19 – 2016-05-20 (×2): 30 mg via SUBCUTANEOUS
  Filled 2016-05-18 (×2): qty 0.3

## 2016-05-18 MED ORDER — VITAMIN E 45 MG (100 UNIT) PO CAPS
1000.0000 [IU] | ORAL_CAPSULE | Freq: Every day | ORAL | Status: DC
  Administered 2016-05-18: 1000 [IU] via ORAL
  Filled 2016-05-18 (×2): qty 2

## 2016-05-18 MED ORDER — ROCURONIUM BROMIDE 100 MG/10ML IV SOLN
INTRAVENOUS | Status: DC | PRN
  Administered 2016-05-18: 40 mg via INTRAVENOUS

## 2016-05-18 MED ORDER — PROPOFOL 10 MG/ML IV BOLUS
INTRAVENOUS | Status: AC
  Filled 2016-05-18: qty 20

## 2016-05-18 MED ORDER — GLYCOPYRROLATE 0.2 MG/ML IJ SOLN
INTRAMUSCULAR | Status: DC | PRN
  Administered 2016-05-18: 0.2 mg via INTRAVENOUS

## 2016-05-18 MED ORDER — HYDROCODONE-ACETAMINOPHEN 5-325 MG PO TABS
1.0000 | ORAL_TABLET | Freq: Four times a day (QID) | ORAL | Status: DC | PRN
  Administered 2016-05-18: 1 via ORAL
  Administered 2016-05-19: 2 via ORAL
  Administered 2016-05-19: 1 via ORAL
  Administered 2016-05-20 – 2016-05-21 (×2): 2 via ORAL
  Filled 2016-05-18 (×2): qty 1
  Filled 2016-05-18: qty 2
  Filled 2016-05-18: qty 1
  Filled 2016-05-18: qty 2
  Filled 2016-05-18: qty 1

## 2016-05-18 MED ORDER — ACETAMINOPHEN 650 MG RE SUPP: 650.0000 mg | Freq: Four times a day (QID) | RECTAL | Status: DC | PRN

## 2016-05-18 MED ORDER — MAGNESIUM CITRATE PO SOLN
1.0000 | Freq: Once | ORAL | Status: AC | PRN
  Administered 2016-05-20: 1 via ORAL
  Filled 2016-05-18: qty 296

## 2016-05-18 MED ORDER — LOSARTAN POTASSIUM 50 MG PO TABS
50.0000 mg | ORAL_TABLET | Freq: Every day | ORAL | Status: DC
  Administered 2016-05-18: 50 mg via ORAL
  Filled 2016-05-18: qty 1

## 2016-05-18 MED ORDER — METHOCARBAMOL 500 MG PO TABS
500.0000 mg | ORAL_TABLET | Freq: Four times a day (QID) | ORAL | Status: DC | PRN
  Administered 2016-05-19 – 2016-05-20 (×3): 500 mg via ORAL
  Filled 2016-05-18 (×3): qty 1

## 2016-05-18 MED ORDER — CEFAZOLIN SODIUM-DEXTROSE 2-4 GM/100ML-% IV SOLN
INTRAVENOUS | Status: AC
  Administered 2016-05-18: 2 g via INTRAVENOUS
  Filled 2016-05-18: qty 100

## 2016-05-18 MED ORDER — ROCURONIUM BROMIDE 50 MG/5ML IV SOLN
INTRAVENOUS | Status: AC
  Filled 2016-05-18: qty 1

## 2016-05-18 MED ORDER — CEFAZOLIN SODIUM-DEXTROSE 2-4 GM/100ML-% IV SOLN
2.0000 g | Freq: Four times a day (QID) | INTRAVENOUS | Status: AC
  Administered 2016-05-18 – 2016-05-19 (×2): 2 g via INTRAVENOUS
  Filled 2016-05-18 (×2): qty 100

## 2016-05-18 MED ORDER — ONDANSETRON HCL 4 MG PO TABS: 4.0000 mg | ORAL_TABLET | Freq: Four times a day (QID) | ORAL | Status: DC | PRN

## 2016-05-18 MED ORDER — ONDANSETRON HCL 4 MG/2ML IJ SOLN: 4.0000 mg | Freq: Four times a day (QID) | INTRAMUSCULAR | Status: DC | PRN

## 2016-05-18 MED ORDER — FENTANYL CITRATE (PF) 100 MCG/2ML IJ SOLN
25.0000 ug | INTRAMUSCULAR | Status: DC | PRN
  Administered 2016-05-18 (×2): 50 ug via INTRAVENOUS

## 2016-05-18 MED ORDER — ATORVASTATIN CALCIUM 20 MG PO TABS
20.0000 mg | ORAL_TABLET | Freq: Every day | ORAL | Status: DC
  Administered 2016-05-19 – 2016-05-22 (×4): 20 mg via ORAL
  Filled 2016-05-18 (×4): qty 1

## 2016-05-18 MED ORDER — DILTIAZEM HCL ER COATED BEADS 300 MG PO CP24
300.0000 mg | ORAL_CAPSULE | Freq: Every day | ORAL | Status: DC
  Administered 2016-05-18 – 2016-05-19 (×2): 300 mg via ORAL
  Filled 2016-05-18 (×3): qty 1

## 2016-05-18 MED ORDER — FENTANYL CITRATE (PF) 100 MCG/2ML IJ SOLN
50.0000 ug | INTRAMUSCULAR | Status: DC | PRN
  Administered 2016-05-18 (×2): 50 ug via INTRAVENOUS
  Filled 2016-05-18: qty 2

## 2016-05-18 MED ORDER — FENTANYL CITRATE (PF) 100 MCG/2ML IJ SOLN
INTRAMUSCULAR | Status: AC
  Filled 2016-05-18: qty 2

## 2016-05-18 MED ORDER — ONDANSETRON HCL 4 MG/2ML IJ SOLN
INTRAMUSCULAR | Status: DC | PRN
  Administered 2016-05-18: 4 mg via INTRAVENOUS

## 2016-05-18 MED ORDER — METHOCARBAMOL 1000 MG/10ML IJ SOLN: 500.0000 mg | Freq: Four times a day (QID) | INTRAVENOUS | Status: DC | PRN

## 2016-05-18 MED ORDER — DEXTROSE-NACL 5-0.45 % IV SOLN
INTRAVENOUS | Status: DC
Start: 2016-05-18 — End: 2016-05-19

## 2016-05-18 MED ORDER — MIDAZOLAM HCL 2 MG/2ML IJ SOLN
INTRAMUSCULAR | Status: AC
  Filled 2016-05-18: qty 2

## 2016-05-18 MED ORDER — 0.9 % SODIUM CHLORIDE (POUR BTL) OPTIME
TOPICAL | Status: DC | PRN
  Administered 2016-05-18: 1000 mL

## 2016-05-18 MED ORDER — FERROUS SULFATE 325 (65 FE) MG PO TABS
325.0000 mg | ORAL_TABLET | Freq: Every day | ORAL | Status: DC
  Administered 2016-05-19 – 2016-05-23 (×5): 325 mg via ORAL
  Filled 2016-05-18 (×7): qty 1

## 2016-05-18 MED ORDER — DOCUSATE SODIUM 100 MG PO CAPS
100.0000 mg | ORAL_CAPSULE | Freq: Two times a day (BID) | ORAL | Status: DC
  Administered 2016-05-19 – 2016-05-20 (×2): 100 mg via ORAL
  Filled 2016-05-18 (×2): qty 1

## 2016-05-18 MED ORDER — PERFLUTREN LIPID MICROSPHERE
1.0000 mL | INTRAVENOUS | Status: AC | PRN
  Administered 2016-05-18: 2 mL via INTRAVENOUS
  Filled 2016-05-18: qty 10

## 2016-05-18 MED ORDER — PHENYLEPHRINE HCL 10 MG/ML IJ SOLN
10.0000 mg | INTRAVENOUS | Status: DC | PRN
  Administered 2016-05-18: 50 ug/min via INTRAVENOUS

## 2016-05-18 MED ORDER — MORPHINE SULFATE (PF) 2 MG/ML IV SOLN
2.0000 mg | INTRAVENOUS | Status: DC | PRN
  Administered 2016-05-19 – 2016-05-20 (×3): 2 mg via INTRAVENOUS
  Filled 2016-05-18 (×3): qty 1

## 2016-05-18 MED ORDER — BISACODYL 5 MG PO TBEC: 5.0000 mg | DELAYED_RELEASE_TABLET | Freq: Every day | ORAL | Status: DC | PRN

## 2016-05-18 MED ORDER — FUROSEMIDE 20 MG PO TABS: 20.0000 mg | ORAL_TABLET | Freq: Every day | ORAL | Status: DC

## 2016-05-18 MED ORDER — ALLOPURINOL 300 MG PO TABS
300.0000 mg | ORAL_TABLET | Freq: Every day | ORAL | Status: DC
  Administered 2016-05-18 – 2016-05-23 (×6): 300 mg via ORAL
  Filled 2016-05-18 (×6): qty 1

## 2016-05-18 MED ORDER — FENTANYL CITRATE (PF) 100 MCG/2ML IJ SOLN
INTRAMUSCULAR | Status: DC | PRN
  Administered 2016-05-18: 100 ug via INTRAVENOUS
  Administered 2016-05-18: 50 ug via INTRAVENOUS

## 2016-05-18 SURGICAL SUPPLY — 44 items
BIT DRILL 4.3MMS DISTAL GRDTED (BIT) ×1 IMPLANT
BLADE SURG ROTATE 9660 (MISCELLANEOUS) ×3 IMPLANT
COVER PERINEAL POST (MISCELLANEOUS) ×3 IMPLANT
COVER SURGICAL LIGHT HANDLE (MISCELLANEOUS) ×6 IMPLANT
DECANTER SPIKE VIAL GLASS SM (MISCELLANEOUS) ×3 IMPLANT
DRAPE STERI IOBAN 125X83 (DRAPES) ×3 IMPLANT
DRILL 4.3MMS DISTAL GRADUATED (BIT) ×3
DRSG MEPILEX BORDER 4X12 (GAUZE/BANDAGES/DRESSINGS) ×3 IMPLANT
DRSG MEPILEX BORDER 4X4 (GAUZE/BANDAGES/DRESSINGS) ×3 IMPLANT
DRSG MEPILEX BORDER 4X8 (GAUZE/BANDAGES/DRESSINGS) ×3 IMPLANT
DURAPREP 26ML APPLICATOR (WOUND CARE) ×3 IMPLANT
ELECT CAUTERY BLADE 6.4 (BLADE) ×3 IMPLANT
ELECT REM PT RETURN 9FT ADLT (ELECTROSURGICAL) ×3
ELECTRODE REM PT RTRN 9FT ADLT (ELECTROSURGICAL) ×1 IMPLANT
EVACUATOR 1/8 PVC DRAIN (DRAIN) IMPLANT
GAUZE XEROFORM 5X9 LF (GAUZE/BANDAGES/DRESSINGS) ×3 IMPLANT
GLOVE BIOGEL PI IND STRL 8 (GLOVE) ×2 IMPLANT
GLOVE BIOGEL PI INDICATOR 8 (GLOVE) ×4
GLOVE ECLIPSE 7.5 STRL STRAW (GLOVE) ×6 IMPLANT
GOWN STRL REUS W/ TWL LRG LVL3 (GOWN DISPOSABLE) ×1 IMPLANT
GOWN STRL REUS W/ TWL XL LVL3 (GOWN DISPOSABLE) ×2 IMPLANT
GOWN STRL REUS W/TWL LRG LVL3 (GOWN DISPOSABLE) ×2
GOWN STRL REUS W/TWL XL LVL3 (GOWN DISPOSABLE) ×4
GUIDEWIRE BALL NOSE 80CM (WIRE) ×3 IMPLANT
HIP FRAC NAIL LEFT 11X360MM (Orthopedic Implant) ×3 IMPLANT
KIT BASIN OR (CUSTOM PROCEDURE TRAY) ×3 IMPLANT
KIT ROOM TURNOVER OR (KITS) ×3 IMPLANT
LINER BOOT UNIVERSAL DISP (MISCELLANEOUS) ×3 IMPLANT
MANIFOLD NEPTUNE II (INSTRUMENTS) ×3 IMPLANT
NAIL HIP FRAC LEFT 11X360MM (Orthopedic Implant) ×1 IMPLANT
NS IRRIG 1000ML POUR BTL (IV SOLUTION) ×3 IMPLANT
PACK GENERAL/GYN (CUSTOM PROCEDURE TRAY) ×3 IMPLANT
PAD ARMBOARD 7.5X6 YLW CONV (MISCELLANEOUS) ×6 IMPLANT
PIN GUIDE 3.2 903003004 (MISCELLANEOUS) ×3 IMPLANT
SCREW BONE CORTICAL 5.0X42 (Screw) ×3 IMPLANT
SCREW LAG 10.5MMX105MM HFN (Screw) ×3 IMPLANT
SCREWDRIVER HEX TIP 3.5MM (MISCELLANEOUS) ×3 IMPLANT
STAPLER VISISTAT 35W (STAPLE) ×3 IMPLANT
SUT VIC AB 0 CTB1 27 (SUTURE) ×6 IMPLANT
SUT VIC AB 1 CTB1 27 (SUTURE) ×3 IMPLANT
SUT VIC AB 2-0 CTB1 (SUTURE) ×3 IMPLANT
TOWEL OR 17X24 6PK STRL BLUE (TOWEL DISPOSABLE) ×3 IMPLANT
TOWEL OR 17X26 10 PK STRL BLUE (TOWEL DISPOSABLE) ×3 IMPLANT
WATER STERILE IRR 1000ML POUR (IV SOLUTION) ×3 IMPLANT

## 2016-05-18 NOTE — Transfer of Care (Signed)
Immediate Anesthesia Transfer of Care Note  Patient: Dustin [name]  Procedure(s) Performed: Procedure(s): INTRAMEDULLARY (IM) NAIL INTERTROCHANTRIC (Left)  Patient Location: PACU  Anesthesia Type:General  Level of Consciousness: awake and alert   Airway & Oxygen Therapy: Patient Spontanous Breathing and Patient connected to face mask oxygen  Post-op Assessment: Report given to RN and Post -op Vital signs reviewed and stable  Post vital signs: Reviewed and stable  Last Vitals:  Filed Vitals:   05/18/16 1333 05/18/16 1443  BP: 175/89 208/83  Pulse: 72 80  Temp:  36.4 C  Resp: 25 18    Last Pain:  Filed Vitals:   05/18/16 1655  PainSc: 0-No pain         Complications: No apparent anesthesia complications

## 2016-05-18 NOTE — ED Provider Notes (Signed)
CSN: [id number]     Arrival date & time 05/18/16  [id number] History   First MD Initiated Contact with Patient 05/18/16 1000     Chief Complaint  Patient presents with  . Dizziness  . Fall     Patient is a 82 y.o. male presenting with dizziness and fall. The history is provided by the patient. No language interpreter was used.  Dizziness Fall   Dustin [name] is a 82 y.o. male who presents to the Emergency Department complaining of fall.  He was getting out of bed this morning and when he turned to pull his oxygen tubing he became dizzy and fell backwards striking his head on the closet door and his left side on the ground. This happened about 8 AM. He denies any loss of consciousness. He reports pain to the left hip and inability to move it due to pain. He did not take any of his morning medications. Symptoms are severe and constant. He did receive fentanyl by EMS and his pain is improved.  Past Medical History  Diagnosis Date  . Hyperlipidemia   . Hypertension   . Abdominal aortic aneurysm (HCC)     with endoleak per Dr. Janetta HoraVarnell, aneurysmal sac size stable as of 12/11 (7.5 x 6.1cm)  . Benign prostatic hypertrophy   . History of chickenpox   . Dysuria   . Heart disease   . Gout   . Sigmoid diverticulosis   . Melanoma in situ [facility](HCC)     [facility]  . MI (myocardial infarction) (HCC)   . Coronary artery disease   . Pulmonary fibrosis (HCC)   . CHF (congestive heart failure) (HCC)   . Emphysema lung [facility](HCC)    Past Surgical History  Procedure Laterality Date  . Hernia repair  2011    Right  . Coronary artery bypass graft  1991  . Bilateral cataract surgery  2011  . Aaa stent  2008  . Left cea  1997   Family History  Problem Relation Age of Onset  . Stroke Mother   . Heart disease Father [id number]    Sudden death  . Heart attack Father    Social History  Substance Use Topics  . Smoking status: Former Smoker -- 1.00 packs/day for 40 years    Types:  Cigarettes    Quit date: 12/23/1988  . Smokeless tobacco: Never Used  . Alcohol Use: 0.0 oz/week    0 Standard drinks or equivalent per week     Comment: WINE 1-2 TIMES WEEKLY AND BEER OCCASSIONALLY    Review of Systems  Neurological: Positive for dizziness.  All other systems reviewed and are negative.     Allergies  Review of patient's allergies indicates no known allergies.  Home Medications   Prior to Admission medications   Medication Sig Start Date End Date Taking? Authorizing Provider  allopurinol (ZYLOPRIM) 300 MG tablet Take 300 mg by mouth daily.   Yes Historical Provider, MD  aspirin 81 MG tablet Take 81 mg by mouth daily.   Yes Historical Provider, MD  atorvastatin (LIPITOR) 20 MG tablet Take 1 tablet (20 mg total) by mouth daily at 6 PM. 05/13/12  Yes Zannie CovePreetha Joseph, MD  cholecalciferol (VITAMIN D) 1000 UNITS tablet Take 1,000 Units by mouth daily.    Yes Historical Provider, MD  diltiazem (TIAZAC) 300 MG 24 hr capsule Take 300 mg by mouth daily.   Yes Historical Provider, MD  docusate sodium (COLACE) 100 MG capsule Take  200 mg by mouth daily.   Yes Historical Provider, MD  dutasteride (AVODART) 0.5 MG capsule Take 0.5 mg by mouth at bedtime.    Yes Historical Provider, MD  losartan (COZAAR) 50 MG tablet Take 50 mg by mouth daily.    Yes Historical Provider, MD  vitamin C (ASCORBIC ACID) 500 MG tablet Take 1,000 mg by mouth daily.    Yes Historical Provider, MD  vitamin E 1000 UNIT capsule Take 1,000 Units by mouth daily.   Yes Historical Provider, MD  clotrimazole-betamethasone (LOTRISONE) cream APPLY TO AFFECTED AREA TWICE DAILY AS NEEDED Patient not taking: Reported on 05/18/2016 11/16/12   [name], MD  diltiazem [facility]) 240 MG 24 hr capsule Take 1 capsule (240 mg total) by mouth daily. Patient not taking: Reported on 05/18/2016 10/10/15   [name], MD  furosemide (LASIX) 40 MG tablet Take in the AM if your weight is above 185 pounds Patient not  taking: Reported on 05/18/2016 10/10/15   [name], MD  potassium chloride SA (K-DUR,KLOR-CON) 20 MEQ tablet Take with lasix Patient not taking: Reported on 05/18/2016 10/10/15   [name], MD   BP 208/83 mmHg  Pulse 80  Temp(Src) 97.5 F (36.4 C) (Oral)  Resp 18  Ht 5\' 7"  (1.702 m)  Wt 189 lb 6.4 oz (85.911 kg)  BMI 29.66 kg/m2  SpO2 100% Physical Exam  Constitutional: He is oriented to person, place, and time. He appears well-developed and well-nourished.  HENT:  Head: Normocephalic and atraumatic.  Cardiovascular: Normal rate and regular rhythm.   No murmur heard. Pulmonary/Chest: Effort normal and breath sounds normal. No respiratory distress.  Abdominal: Soft. There is no tenderness. There is no rebound and no guarding.  Musculoskeletal:  2+ pitting edema in bilateral lower extremities. The left lower extremity is externally rotated and shortened with tenderness to palpation over the left hip. 2+ DP pulses bilaterally.  Neurological: He is alert and oriented to person, place, and time.  Skin: Skin is warm and dry.  Psychiatric: He has a normal mood and affect. His behavior is normal.  Nursing note and vitals reviewed.   ED Course  Procedures (including critical care time) Labs Review Labs Reviewed  COMPREHENSIVE METABOLIC PANEL - Abnormal; Notable for the following:    Glucose, Bld 126 (*)    Calcium 8.4 (*)    Total Protein 5.1 (*)    Albumin 2.9 (*)    AST 13 (*)    ALT 11 (*)    GFR calc non Af Amer 57 (*)    All other components within normal limits  CBC WITH DIFFERENTIAL/PLATELET - Abnormal; Notable for the following:    RBC 4.16 (*)    Hemoglobin 11.0 (*)    HCT 35.8 (*)    Platelets 137 (*)    All other components within normal limits  URINALYSIS, ROUTINE W REFLEX MICROSCOPIC (NOT AT [facility]) - Abnormal; Notable for the following:    Protein, ur >300 (*)    All other components within normal limits  URINE MICROSCOPIC-ADD ON - Abnormal; Notable  for the following:    Squamous Epithelial / LPF 0-5 (*)    Bacteria, UA RARE (*)    All other components within normal limits  I-STAT TROPOININ, ED    Imaging Review Dg Chest 1 View  05/18/2016  CLINICAL DATA:  Dizziness after getting out of bed and falling. EXAM: CHEST 1 VIEW COMPARISON:  09/08/2015. FINDINGS: Normal sized heart. Small amount of linear  density at the left lung base. Stable prominence of the interstitial markings, hyper expansion of the lungs and diffuse peribronchial thickening. Diffuse osteopenia. Old, healed right humeral neck fracture. Post CABG changes. IMPRESSION: 1. Minimal linear atelectasis or scarring at the left lung base. 2. Stable changes of COPD and chronic bronchitis. Electronically Signed   By: Beckie Salts M.D.   On: 05/18/2016 11:53   Ct Head Wo Contrast  05/18/2016  CLINICAL DATA:  Status post fall, left hip pain EXAM: CT HEAD WITHOUT CONTRAST TECHNIQUE: Contiguous axial images were obtained from the base of the skull through the vertex without intravenous contrast. COMPARISON:  None. FINDINGS: There is no evidence of mass effect, midline shift, or extra-axial fluid collections. There is no evidence of a space-occupying lesion or intracranial hemorrhage. There is no evidence of a cortical-based area of acute infarction. 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 There is generalized cerebral atrophy. There is periventricular white matter low attenuation likely secondary to microangiopathy. The ventricles and sulci are appropriate for the patient's age. The basal cisterns are patent. Visualized portions of the orbits are unremarkable. There is right maxillary sinus mucosal thickening. Cerebrovascular atherosclerotic calcifications are noted. The osseous structures are unremarkable. IMPRESSION: No acute intracranial pathology. Electronically Signed   By: Elige Ko   On: 05/18/2016 12:16   Dg Hip Unilat With Pelvis 2-3 Views Left  05/18/2016   CLINICAL DATA:  Left hip pain after or getting out of bed and falling this morning. EXAM: DG HIP (WITH OR WITHOUT PELVIS) 2-3V LEFT COMPARISON:  None. FINDINGS: Comminuted left intertrochanteric fracture with varus angulation. Diffuse osteopenia. Bilateral iliac artery stents. Atheromatous arterial calcifications. IMPRESSION: Comminuted left intertrochanteric fracture with varus angulation. Electronically Signed   By: Beckie Salts M.D.   On: 05/18/2016 11:55   I have personally reviewed and evaluated these images and lab results as part of my medical decision-making.   EKG Interpretation   Date/Time:  Saturday May 18 2016 10:05:37 EDT Ventricular Rate:  68 PR Interval:  162 QRS Duration: 88 QT Interval:  411 QTC Calculation: 437 R Axis:   64 Text Interpretation:  Sinus rhythm Anteroseptal infarct, old Borderline  repolarization abnormality Confirmed by Lincoln Brigham [phone]) on 05/18/2016  11:06:18 AM      MDM   Final diagnoses:  Closed displaced intertrochanteric fracture of left femur, initial encounter [facility])    Patient here for evaluation of injuries following a mechanical fall. He has a closed left intertrochanteric femur fracture. He has multiple medical comorbidities such as CHF, chronic lung disease, aortic aneurysm. Dr. Luiz Blare with orthopedics was contacted regarding his fracture and he evaluated the patient in the emergency department. The hospitalist was consulted for admission for medical comanagement given his comorbidities. Patient and family updated of studies and plan for admission for further treatment.    Tilden Fossa, MD 05/18/16 [phone]

## 2016-05-18 NOTE — Discharge Instructions (Signed)
Ambulate touch down wgt bearing on the left leg

## 2016-05-18 NOTE — Anesthesia Postprocedure Evaluation (Signed)
Anesthesia Post Note  Patient: Dustin [name]  Procedure(s) Performed: Procedure(s) (LRB): INTRAMEDULLARY (IM) NAIL INTERTROCHANTRIC (Left)  Patient location during evaluation: PACU Anesthesia Type: General Level of consciousness: awake and alert, oriented and patient cooperative Pain management: pain level controlled Vital Signs Assessment: post-procedure vital signs reviewed and stable Respiratory status: spontaneous breathing, nonlabored ventilation, respiratory function stable and patient connected to nasal cannula oxygen Cardiovascular status: blood pressure returned to baseline and stable Postop Assessment: no signs of nausea or vomiting Anesthetic complications: no    Last Vitals:  Filed Vitals:   05/18/16 1930 05/18/16 2000  BP: 136/64 132/55  Pulse: 86 85  Temp: 36.4 C 36.6 C  Resp: 21 20    Last Pain:  Filed Vitals:   05/18/16 2012  PainSc: 3                  Marijose Curington,E. Yuta Cipollone

## 2016-05-18 NOTE — Anesthesia Procedure Notes (Signed)
Procedure Name: Intubation Date/Time: 05/18/2016 5:06 PM Performed by: Dairl PonderJIANG, Renesmae Donahey Pre-anesthesia Checklist: Patient identified, Emergency Drugs available, Suction available, Patient being monitored and Timeout performed Patient Re-evaluated:Patient Re-evaluated prior to inductionOxygen Delivery Method: Circle system utilized Preoxygenation: Pre-oxygenation with 100% oxygen Intubation Type: IV induction Ventilation: Mask ventilation without difficulty and Oral airway inserted - appropriate to patient size Laryngoscope Size: Mac and 3 Grade View: Grade I Tube type: Oral Tube size: 7.5 mm Number of attempts: 1 Airway Equipment and Method: Stylet Placement Confirmation: ETT inserted through vocal cords under direct vision,  positive ETCO2 and breath sounds checked- equal and bilateral Secured at: 22 cm Tube secured with: Tape Dental Injury: Teeth and Oropharynx as per pre-operative assessment

## 2016-05-18 NOTE — ED Notes (Signed)
Pt here for dizziness this am after getting out of bed and fall. Pt fell on left side hurting left hip. No head trauma, no neck pain. No LOC.

## 2016-05-18 NOTE — Progress Notes (Signed)
Orthopedic Tech Progress Note Patient Details:  Dustin [name] 02/20/1926 [id number]  Ortho Devices Ortho Device/Splint Location: applied ohf to bed Ortho Device/Splint Interventions: Ordered, Application   Jennye MoccasinHughes, Thelton Graca Craig 05/18/2016, 8:52 PM

## 2016-05-18 NOTE — H&P (Signed)
[facility] History and Physical  Dustin [name] ZOX:[id number] DOB: [birth year] DOA: 05/18/2016  Referring physician:  PCP: Dustin Givens, MD  Specialists:   Chief Complaint: fall, hip pain  HPI: Dustin [name] is a 82 y.o. male with PMH of HTN, CAD h/o CABG, AAA s/p Stent, CHF, COPD, Pulmonary Fibrosis on Home oxygen presented after mechanical fall and hip pain. He was getting out of bed, but could not turn due to oxygen tube and fell to his left side. He complained of left hip pain. He denies loss of consciousness, no focal neuro weakness or paresthesias prior or after the fall. No change is speech, or hearing. He had mild dizziness, no vertigo.  -ED: hip x ray+ fracture. He denies acute chest pains, no SOB, no nausea, vomiting or diarrhea. He has intermittent chronic leg edema. Ortho is evaluating for surgery. hospitalist is asked for admission     Review of Systems: The patient denies anorexia, fever, weight loss,, vision loss, decreased hearing, hoarseness, chest pain, syncope, dyspnea on exertion, peripheral edema, balance deficits, hemoptysis, abdominal pain, melena, hematochezia, severe indigestion/heartburn, hematuria, incontinence, genital sores, muscle weakness, suspicious skin lesions, transient blindness, difficulty walking, depression, unusual weight change, abnormal bleeding, enlarged lymph nodes, angioedema, and breast masses.    Past Medical History  Diagnosis Date  . Hyperlipidemia   . Hypertension   . Abdominal aortic aneurysm (HCC)     with endoleak per Dustin [name], aneurysmal sac size stable as of 12/11 (7.5 x 6.1cm)  . Benign prostatic hypertrophy   . History of chickenpox   . Dysuria   . Heart disease   . Gout   . Sigmoid diverticulosis   . Melanoma in situ [facility])     Per [facility] 2014  . MI (myocardial infarction) (HCC)   . Coronary artery disease   . Pulmonary fibrosis (HCC)   . CHF (congestive heart failure) (HCC)   . Emphysema  lung [facility])    Past Surgical History  Procedure Laterality Date  . Hernia repair  2011    Right  . Coronary artery bypass graft  1991  . Bilateral cataract surgery  2011  . Aaa stent  2008  . Left cea  1997   Social History:  reports that he quit smoking about 27 years ago. His smoking use included Cigarettes. He has a 40 pack-year smoking history. He has never used smokeless tobacco. He reports that he drinks alcohol. He reports that he does not use illicit drugs. Home;  where does patient live--home, ALF, SNF? and with whom if at home? Yes;  Can patient participate in ADLs?  No Known Allergies  Family History  Problem Relation Age of Onset  . Stroke Mother   . Heart disease Father 95    Sudden death  . Heart attack Father     (be sure to complete)  Prior to Admission medications   Medication Sig Start Date End Date Taking? Authorizing Provider  allopurinol (ZYLOPRIM) 300 MG tablet Take 300 mg by mouth daily.   Yes Historical Provider, MD  aspirin 81 MG tablet Take 81 mg by mouth daily.   Yes Historical Provider, MD  atorvastatin (LIPITOR) 20 MG tablet Take 1 tablet (20 mg total) by mouth daily at 6 PM. 05/13/12  Yes Zannie Cove, MD  cholecalciferol (VITAMIN D) 1000 UNITS tablet Take 1,000 Units by mouth daily.    Yes Historical Provider, MD  diltiazem (TIAZAC) 300 MG 24 hr capsule Take 300 mg by mouth  daily.   Yes Historical Provider, MD  docusate sodium (COLACE) 100 MG capsule Take 200 mg by mouth daily.   Yes Historical Provider, MD  dutasteride (AVODART) 0.5 MG capsule Take 0.5 mg by mouth at bedtime.    Yes Historical Provider, MD  losartan (COZAAR) 50 MG tablet Take 50 mg by mouth daily.    Yes Historical Provider, MD  vitamin C (ASCORBIC ACID) 500 MG tablet Take 1,000 mg by mouth daily.    Yes Historical Provider, MD  vitamin E 1000 UNIT capsule Take 1,000 Units by mouth daily.   Yes Historical Provider, MD  clotrimazole-betamethasone (LOTRISONE) cream APPLY TO AFFECTED  AREA TWICE DAILY AS NEEDED Patient not taking: Reported on 05/18/2016 11/16/12   Joaquim NamGraham S Duncan, MD  diltiazem [facility](TIAZAC) 240 MG 24 hr capsule Take 1 capsule (240 mg total) by mouth daily. Patient not taking: Reported on 05/18/2016 10/10/15   Joaquim NamGraham S Duncan, MD  furosemide (LASIX) 40 MG tablet Take in the AM if your weight is above 185 pounds Patient not taking: Reported on 05/18/2016 10/10/15   Joaquim NamGraham S Duncan, MD  potassium chloride SA (K-DUR,KLOR-CON) 20 MEQ tablet Take with lasix Patient not taking: Reported on 05/18/2016 10/10/15   Joaquim NamGraham S Duncan, MD   Physical Exam: Filed Vitals:   05/18/16 1100 05/18/16 1300  BP: 203/79 181/81  Pulse: 74 77  Temp:    Resp: 15 25     General:  Alert,. No distress   Eyes: eom-i, perrla   ENT: no oral ulcers   Neck: supple, no JVD   Cardiovascular: s1,s2 rrr  Respiratory: CTA BL   Abdomen: soft, nt, nd   Skin: no rash  Musculoskeletal: left hip tender   Psychiatric: no hallucinations   Neurologic: CN 2-12 intact. Motor intact, except left hip due to pain   Labs on Admission:  Basic Metabolic Panel:  Recent Labs Lab 05/18/16 1111  NA 140  K 3.6  CL 105  CO2 29  GLUCOSE 126*  BUN 17  CREATININE 1.12  CALCIUM 8.4*   Liver Function Tests:  Recent Labs Lab 05/18/16 1111  AST 13*  ALT 11*  ALKPHOS 61  BILITOT 0.7  PROT 5.1*  ALBUMIN 2.9*   No results for input(s): LIPASE, AMYLASE in the last 168 hours. No results for input(s): AMMONIA in the last 168 hours. CBC:  Recent Labs Lab 05/18/16 1111  WBC 8.8  NEUTROABS 7.4  HGB 11.0*  HCT 35.8*  MCV 86.1  PLT 137*   Cardiac Enzymes: No results for input(s): CKTOTAL, CKMB, CKMBINDEX, TROPONINI in the last 168 hours.  BNP (last 3 results)  Recent Labs  09/08/15 1808  BNP 109.1*    ProBNP (last 3 results)  Recent Labs  09/11/15 1652  PROBNP 117.0*    CBG: No results for input(s): GLUCAP in the last 168 hours.  Radiological Exams on  Admission: Dg Chest 1 View  05/18/2016  CLINICAL DATA:  Dizziness after getting out of bed and falling. EXAM: CHEST 1 VIEW COMPARISON:  09/08/2015. FINDINGS: Normal sized heart. Small amount of linear density at the left lung base. Stable prominence of the interstitial markings, hyper expansion of the lungs and diffuse peribronchial thickening. Diffuse osteopenia. Old, healed right humeral neck fracture. Post CABG changes. IMPRESSION: 1. Minimal linear atelectasis or scarring at the left lung base. 2. Stable changes of COPD and chronic bronchitis. Electronically Signed   By: Beckie SaltsSteven  Reid M.D.   On: 05/18/2016 11:53   Ct Head Wo Contrast  05/18/2016  CLINICAL DATA:  Status post fall, left hip pain EXAM: CT HEAD WITHOUT CONTRAST TECHNIQUE: Contiguous axial images were obtained from the base of the skull through the vertex without intravenous contrast. COMPARISON:  None. FINDINGS: There is no evidence of mass effect, midline shift, or extra-axial fluid collections. There is no evidence of a space-occupying lesion or intracranial hemorrhage. There is no evidence of a cortical-based area of acute infarction. 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 There is generalized cerebral atrophy. There is periventricular white matter low attenuation likely secondary to microangiopathy. The ventricles and sulci are appropriate for the patient's age. The basal cisterns are patent. Visualized portions of the orbits are unremarkable. There is right maxillary sinus mucosal thickening. Cerebrovascular atherosclerotic calcifications are noted. The osseous structures are unremarkable. IMPRESSION: No acute intracranial pathology. Electronically Signed   By: Elige Ko   On: 05/18/2016 12:16   Dg Hip Unilat With Pelvis 2-3 Views Left  05/18/2016  CLINICAL DATA:  Left hip pain after or getting out of bed and falling this morning. EXAM: DG HIP (WITH OR WITHOUT PELVIS) 2-3V LEFT COMPARISON:  None. FINDINGS:  Comminuted left intertrochanteric fracture with varus angulation. Diffuse osteopenia. Bilateral iliac artery stents. Atheromatous arterial calcifications. IMPRESSION: Comminuted left intertrochanteric fracture with varus angulation. Electronically Signed   By: Beckie Salts M.D.   On: 05/18/2016 11:55    EKG: Independently reviewed.   Assessment/Plan Active Problems:   Emphysema of lung (HCC)   Hip fracture, left (HCC)  82 y.o. male with PMH of HTN, CAD h/o CABG, AAA s/p stent, CHF, COPD, Pulmonary Fibrosis on Home oxygen presented after mechanical fall and hip pain. Admitted with left hip fracture   Fall-mechanical. Left hip fracture. Patient is being evaluated by ortho for surgery   Preop eval: moderate to high risk. CAD h/o CABG, AAA, CHF, Chronic respiratory failure. Will obtain preop echo. Also will obtain doppler legs due to chronic intermittent edema r/o DVT.  -If stable patient, family wants to proceed with surgery clearly understanding perioperative risk.   CAD h/o CABG, AAA s/p Stent, chronic CHF. Patient denies acute cardiopulmonary symptoms, reports chronic dyspnea. But has progressive leg edema.  -we will cont aspirin, statin, obtain echo, doppler leg [place] r/o DVT   COPD, Pulmonary Fibrosis, chronic respiratory failure on Home oxygen. No wheezing on exam, chest x ray: no clear infiltrates.  -we will cont oxygen, bronchodilators prn, will need post op close monitor   HTN. Uncontrolled on admission.    Ortho.  if consultant consulted, please document name and whether formally or informally consulted  Code Status: full (must indicate code status--if unknown or must be presumed, indicate so) Family Communication: d/w patient, his daughter  (indicate person spoken with, if applicable, with phone number if by telephone) Disposition Plan: pend surgery (indicate anticipated LOS)  Time spent: >45 minutes   Dustin [name] [facility] Pager [phone]  If 7PM-7AM, please  contact night-coverage [email or web address] Password [email or web address] 05/18/2016, 1:21 PM

## 2016-05-18 NOTE — Progress Notes (Addendum)
Patient received from ED then OR called immediately for patient to come down to OR for surgery .OR staff transported patient to OR. Echo paged to OR for stat echo before surgery.

## 2016-05-18 NOTE — Anesthesia Preprocedure Evaluation (Addendum)
Anesthesia Evaluation  Patient identified by MRN, date of birth, ID band Patient awake    Reviewed: Allergy & Precautions, NPO status , Patient's Chart, lab work & pertinent test results  History of Anesthesia Complications Negative for: history of anesthetic complications  Airway Mallampati: II  TM Distance: >3 FB Neck ROM: Full    Dental  (+) Dental Advisory Given   Pulmonary COPD,  oxygen dependent, former smoker (quit 1990),  Pulmonary fibrosis   breath sounds clear to auscultation       Cardiovascular hypertension, Pt. on medications (-) angina+ CAD, + CABG (1991), + Peripheral Vascular Disease (AAA 7.5 x 6.1 cm, now s/p endovasc stent, s/p CEA) and +CHF   Rhythm:Regular Rate:Normal  ECHO today: EF 55-60%, grade 1 diastolic dysfunction, valves OK '15 ECHO: EF 60-65%, valves OK   Neuro/Psych negative neurological ROS     GI/Hepatic negative GI ROS, Neg liver ROS,   Endo/Other  negative endocrine ROS  Renal/GU negative Renal ROS     Musculoskeletal  (+) Arthritis ,   Abdominal   Peds  Hematology negative hematology ROS (+)   Anesthesia Other Findings   Reproductive/Obstetrics                        Anesthesia Physical Anesthesia Plan  ASA: III  Anesthesia Plan: General   Post-op Pain Management:    Induction: Intravenous  Airway Management Planned: Oral ETT  Additional Equipment:   Intra-op Plan:   Post-operative Plan: Possible Post-op intubation/ventilation  Informed Consent: I have reviewed the patients History and Physical, chart, labs and discussed the procedure including the risks, benefits and alternatives for the proposed anesthesia with the patient or authorized representative who has indicated his/her understanding and acceptance.   Dental advisory given  Plan Discussed with: CRNA and Surgeon  Anesthesia Plan Comments: (Plan routine monitors, GETA with possible  post op ventilation)        Anesthesia Quick Evaluation

## 2016-05-18 NOTE — Progress Notes (Signed)
VASCULAR LAB PRELIMINARY  PRELIMINARY  PRELIMINARY  PRELIMINARY  Bilateral lower extremity venous duplex completed.    Preliminary report:  There is no DVT or SVT noted in the bilateral lower extremities.   Zohra Clavel, RVT 05/18/2016, 4:38 PM

## 2016-05-18 NOTE — Consult Note (Signed)
Reason for Consult:[name] intertroch fx Referring Physician: hospitalists  Dustin [name] is an 82 y.o. male.  HPI: 82 yo male with multiple medical problems who fell earlier today and suffered a [name] intertroch fx.  Pt evaluated in the er and admitted to int medicine and we are consulted for evaluation and management.  Pt denies previous hip problems and is an ambulator prior to today.  Past Medical History  Diagnosis Date  . Hyperlipidemia   . Hypertension   . Abdominal aortic aneurysm (HCC)     with endoleak per Dr. Inez Pilgrim, aneurysmal sac size stable as of 12/11 (7.5 x 6.1cm)  . Benign prostatic hypertrophy   . History of chickenpox   . Dysuria   . Heart disease   . Gout   . Sigmoid diverticulosis   . Melanoma in situ [facility])     Per [facility] 2014  . MI (myocardial infarction) ([place])   . Coronary artery disease   . Pulmonary fibrosis ([place])   . CHF (congestive heart failure) ([place])   . Emphysema lung [facility])     Past Surgical History  Procedure Laterality Date  . Hernia repair  2011    Right  . Coronary artery bypass graft  1991  . Bilateral cataract surgery  2011  . Aaa stent  2008  . Left cea  1997    Family History  Problem Relation Age of Onset  . Stroke Mother   . Heart disease Father 25    Sudden death  . Heart attack Father     Social History:  reports that he quit smoking about 27 years ago. His smoking use included Cigarettes. He has a 40 pack-year smoking history. He has never used smokeless tobacco. He reports that he drinks alcohol. He reports that he does not use illicit drugs.  Allergies: No Known Allergies  Medications: I have reviewed the patient's current medications.  Results for orders placed or performed during the hospital encounter of 05/18/16 (from the past 48 hour(s))  Urinalysis, Routine w reflex microscopic     Status: Abnormal   Collection Time: 05/18/16 10:43 AM  Result Value Ref Range   Color, Urine YELLOW YELLOW   APPearance CLEAR CLEAR   Specific Gravity, Urine 1.019 1.005 - 1.030   pH 6.5 5.0 - 8.0   Glucose, UA NEGATIVE NEGATIVE mg/dL   Hgb urine dipstick NEGATIVE NEGATIVE   Bilirubin Urine NEGATIVE NEGATIVE   Ketones, ur NEGATIVE NEGATIVE mg/dL   Protein, ur >300 (A) NEGATIVE mg/dL   Nitrite NEGATIVE NEGATIVE   Leukocytes, UA NEGATIVE NEGATIVE  Urine microscopic-add on     Status: Abnormal   Collection Time: 05/18/16 10:43 AM  Result Value Ref Range   Squamous Epithelial / LPF 0-5 (A) NONE SEEN   WBC, UA 0-5 0 - 5 WBC/hpf   RBC / HPF 0-5 0 - 5 RBC/hpf   Bacteria, UA RARE (A) NONE SEEN  Comprehensive metabolic panel     Status: Abnormal   Collection Time: 05/18/16 11:11 AM  Result Value Ref Range   Sodium 140 135 - 145 mmol/[name]   Potassium 3.6 3.5 - 5.1 mmol/[name]   Chloride 105 101 - 111 mmol/[name]   CO2 29 22 - 32 mmol/[name]   Glucose, Bld 126 (H) 65 - 99 mg/dL   BUN 17 6 - 20 mg/dL   Creatinine, Ser 1.12 0.61 - 1.24 mg/dL   Calcium 8.4 ([name]) 8.9 - 10.3 mg/dL   Total Protein 5.1 ([name]) 6.5 -  8.1 g/dL   Albumin 2.9 ([name]) 3.5 - 5.0 g/dL   AST 13 ([name]) 15 - 41 U/[name]   ALT 11 ([name]) 17 - 63 U/[name]   Alkaline Phosphatase 61 38 - 126 U/[name]   Total Bilirubin 0.7 0.3 - 1.2 mg/dL   GFR calc non Af Amer 57 ([name]) >60 mL/min   GFR calc Af Amer >60 >60 mL/min    Comment: (NOTE) The eGFR has been calculated using the CKD EPI equation. This calculation has not been validated in all clinical situations. eGFR's persistently <60 mL/min signify possible Chronic Kidney Disease.    Anion gap 6 5 - 15  CBC with Differential     Status: Abnormal   Collection Time: 05/18/16 11:11 AM  Result Value Ref Range   WBC 8.8 4.0 - 10.5 K/uL   RBC 4.16 ([name]) 4.22 - 5.81 MIL/uL   Hemoglobin 11.0 ([name]) 13.0 - 17.0 g/dL   HCT 35.8 ([name]) 39.0 - 52.0 %   MCV 86.1 78.0 - 100.0 fL   MCH 26.4 26.0 - 34.0 pg   MCHC 30.7 30.0 - 36.0 g/dL   RDW 15.0 11.5 - 15.5 %   Platelets 137 ([name]) 150 - 400 K/uL   Neutrophils Relative % 83 %   Neutro Abs 7.4 1.7 -  7.7 K/uL   Lymphocytes Relative 8 %   Lymphs Abs 0.7 0.7 - 4.0 K/uL   Monocytes Relative 8 %   Monocytes Absolute 0.7 0.1 - 1.0 K/uL   Eosinophils Relative 1 %   Eosinophils Absolute 0.1 0.0 - 0.7 K/uL   Basophils Relative 0 %   Basophils Absolute 0.0 0.0 - 0.1 K/uL  I-stat troponin, ED     Status: None   Collection Time: 05/18/16 11:17 AM  Result Value Ref Range   Troponin i, poc 0.02 0.00 - 0.08 ng/mL   Comment 3            Comment: Due to the release kinetics of cTnI, a negative result within the first hours of the onset of symptoms does not rule out myocardial infarction with certainty. If myocardial infarction is still suspected, repeat the test at appropriate intervals.     Dg Chest 1 View  05/18/2016  CLINICAL DATA:  Dizziness after getting out of bed and falling. EXAM: CHEST 1 VIEW COMPARISON:  09/08/2015. FINDINGS: Normal sized heart. Small amount of linear density at the left lung base. Stable prominence of the interstitial markings, hyper expansion of the lungs and diffuse peribronchial thickening. Diffuse osteopenia. Old, healed right humeral neck fracture. Post CABG changes. IMPRESSION: 1. Minimal linear atelectasis or scarring at the left lung base. 2. Stable changes of COPD and chronic bronchitis. Electronically Signed   By: Claudie Revering M.D.   On: 05/18/2016 11:53   Ct Head Wo Contrast  05/18/2016  CLINICAL DATA:  Status post fall, left hip pain EXAM: CT HEAD WITHOUT CONTRAST TECHNIQUE: Contiguous axial images were obtained from the base of the skull through the vertex without intravenous contrast. COMPARISON:  None. FINDINGS: There is no evidence of mass effect, midline shift, or extra-axial fluid collections. There is no evidence of a space-occupying lesion or intracranial hemorrhage. There is no evidence of a cortical-based area of acute infarction. 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 There is generalized cerebral atrophy. There is  periventricular white matter low attenuation likely secondary to microangiopathy. The ventricles and sulci are appropriate for the patient's age. The basal cisterns are patent. Visualized portions of  the orbits are unremarkable. There is right maxillary sinus mucosal thickening. Cerebrovascular atherosclerotic calcifications are noted. The osseous structures are unremarkable. IMPRESSION: No acute intracranial pathology. Electronically Signed   By: Kathreen Devoid   On: 05/18/2016 12:16   Dg Hip Unilat With Pelvis 2-3 Views Left  05/18/2016  CLINICAL DATA:  Left hip pain after or getting out of bed and falling this morning. EXAM: DG HIP (WITH OR WITHOUT PELVIS) 2-3V LEFT COMPARISON:  None. FINDINGS: Comminuted left intertrochanteric fracture with varus angulation. Diffuse osteopenia. Bilateral iliac artery stents. Atheromatous arterial calcifications. IMPRESSION: Comminuted left intertrochanteric fracture with varus angulation. Electronically Signed   By: Claudie Revering M.D.   On: 05/18/2016 11:55    ROS  ROS: I have reviewed the patient's review of systems thoroughly and there are no positive responses as relates to the HPI. EXAM: Blood pressure 203/79, pulse 74, temperature 97.5 F (36.4 C), temperature source Oral, resp. rate 15, SpO2 97 %. Physical Exam Well-developed well-nourished patient in no acute distress. Alert and oriented x3 HEENT:within normal limits Cardiac: Regular rate and rhythm Pulmonary: Lungs clear to auscultation Abdomen: Soft and nontender.  Normal active bowel sounds  Musculoskeletal: ([name] hip ext rotation and shortened with pain on all rom NVI distally  Assessment/Plan: 82 yo male with fall earlier today and suffered [name] intertroch fx.// Pt needs clearance by internal medicine with some understanding of his abdominal aneurysm and plan for orif later today when cleared.  Pt and daughter understand the risks and benefits of surgery including but not limited to  bleeding,infection,need for further surgery and death during or after surgery and they wish to proceed.  Dustin [name] 05/18/2016, 12:51 PM

## 2016-05-19 DIAGNOSIS — I5032 Chronic diastolic (congestive) heart failure: Secondary | ICD-10-CM

## 2016-05-19 DIAGNOSIS — N179 Acute kidney failure, unspecified: Secondary | ICD-10-CM | POA: Diagnosis present

## 2016-05-19 DIAGNOSIS — Z951 Presence of aortocoronary bypass graft: Secondary | ICD-10-CM

## 2016-05-19 DIAGNOSIS — D62 Acute posthemorrhagic anemia: Secondary | ICD-10-CM

## 2016-05-19 LAB — BASIC METABOLIC PANEL
ANION GAP: 7 (ref 5–15)
BUN: 24 mg/dL — ABNORMAL HIGH (ref 6–20)
CALCIUM: 7.9 mg/dL — AB (ref 8.9–10.3)
CHLORIDE: 106 mmol/L (ref 101–111)
CO2: 27 mmol/L (ref 22–32)
CREATININE: 1.62 mg/dL — AB (ref 0.61–1.24)
GFR calc non Af Amer: 37 mL/min — ABNORMAL LOW (ref >60)
GFR, EST AFRICAN AMERICAN: 42 mL/min — AB (ref >60)
Glucose, Bld: 144 mg/dL — ABNORMAL HIGH (ref 65–99)
Potassium: 4.5 mmol/L (ref 3.5–5.1)
SODIUM: 140 mmol/L (ref 135–145)

## 2016-05-19 LAB — CBC
HEMATOCRIT: 26.3 % — AB (ref 39.0–52.0)
HEMOGLOBIN: 8.2 g/dL — AB (ref 13.0–17.0)
MCH: 27.1 pg (ref 26.0–34.0)
MCHC: 31.2 g/dL (ref 30.0–36.0)
MCV: 86.8 fL (ref 78.0–100.0)
Platelets: 122 10*3/uL — ABNORMAL LOW (ref 150–400)
RBC: 3.03 MIL/uL — ABNORMAL LOW (ref 4.22–5.81)
RDW: 15.4 % (ref 11.5–15.5)
WBC: 11.9 10*3/uL — AB (ref 4.0–10.5)

## 2016-05-19 MED ORDER — SODIUM CHLORIDE 0.9 % IV SOLN
INTRAVENOUS | Status: DC
  Administered 2016-05-19 – 2016-05-21 (×5): via INTRAVENOUS

## 2016-05-19 MED ORDER — DILTIAZEM HCL ER COATED BEADS 240 MG PO CP24
240.0000 mg | ORAL_CAPSULE | Freq: Every day | ORAL | Status: DC
  Administered 2016-05-20: 240 mg via ORAL
  Filled 2016-05-19: qty 1

## 2016-05-19 NOTE — Progress Notes (Signed)
Occupational Therapy Evaluation Patient Details Name: Dustin [name] MRN: [id number] DOB: 1926-06-03 Today's Date: 05/19/2016    History of Present Illness HPI: Dustin [name] is a 82 y.o. male with PMH of HTN, CAD h/o CABG, AAA s/p Stent, CHF, COPD, Pulmonary Fibrosis on Home oxygen presented after mechanical fall and hip pain s/p IM Nail L hip, TDWB   Clinical Impression   PTA, pt was independent with ADLs. Pt currently requires total +2 assist for LB ADLs and transfers. Able to complete bed mobility and lateral-scoot transfer to R side from bed to chair with total +2 assist (pt assisting <10%). Pt will benefit from continued acute OT to increase independence and safety with ADLs and mobility. Recommend SNF for post-acute rehab stay prior to returning home. Will continue to follow acutely.    Follow Up Recommendations  SNF    Equipment Recommendations  Other (comment) (TBD in next venue)    Recommendations for Other Services       Precautions / Restrictions Precautions Precautions: Fall Restrictions Weight Bearing Restrictions: Yes LLE Weight Bearing: Touchdown weight bearing      Mobility Bed Mobility Overal bed mobility: Needs Assistance;+2 for physical assistance Bed Mobility: Supine to Sit     Supine to sit: Total assist;+2 for physical assistance;HOB elevated     General bed mobility comments: Assist to move bilateral LEs and for trunk support to come to sitting position. Mod assist to maintain sitting balance. VCs for hand placement throughout for hand placement and safety.  Transfers Overall transfer level: Needs assistance Equipment used: 2 person hand held assist Transfers: Lateral/Scoot Transfers          Lateral/Scoot Transfers: Total assist;+2 physical assistance General transfer comment: Assist to scoot hips to R side using bed pad and for trunk control throughout transfer. VCs throughout for safety and hand placement.     Balance Overall balance  assessment: Needs assistance Sitting-balance support: Bilateral upper extremity supported;Feet supported Sitting balance-Leahy Scale: Poor Sitting balance - Comments: Mod assist to maintain safe upright position Postural control: Posterior lean                                  ADL Overall ADL's : Needs assistance/impaired         Upper Body Bathing: Maximal assistance;Bed level   Lower Body Bathing: Total assistance;Bed level   Upper Body Dressing : Total assistance;Bed level   Lower Body Dressing: Total assistance;Bed level   Toilet Transfer: Total assistance;+2 for physical assistance;Cueing for sequencing;Cueing for safety;Squat-pivot Toilet Transfer Details (indicate cue type and reason): simulated to chair Toileting- Clothing Manipulation and Hygiene: Maximal assistance;+2 for physical assistance;Sitting/lateral lean       Functional mobility during ADLs: Total assistance;+2 for physical assistance       Vision Vision Assessment?: No apparent visual deficits   Perception     Praxis      Pertinent Vitals/Pain Pain Assessment: 0-10 Pain Score: 7  Pain Location: LLE with movement Pain Descriptors / Indicators: Grimacing;Guarding;Moaning;Sore Pain Intervention(s): Monitored during session;Limited activity within patient's tolerance;Premedicated before session;Repositioned     Hand Dominance Right   Extremity/Trunk Assessment Upper Extremity Assessment Upper Extremity Assessment: Generalized weakness   Lower Extremity Assessment Lower Extremity Assessment: Defer to PT evaluation       Communication Communication Communication: No difficulties   Cognition Arousal/Alertness: Awake/alert Behavior During Therapy: Flat affect Overall Cognitive Status: Within Functional Limits for tasks assessed (  minimal verbal output during session)                     General Comments       Exercises       Shoulder Instructions      Home  Living Family/patient expects to be discharged to:: Private residence Living Arrangements: Spouse/significant other Available Help at Discharge: Family;Available 24 hours/day;Available PRN/intermittently (daughters live nearby) Type of Home: House Home Access: Stairs to enter   Entrance Stairs-Rails: Right;Left Home Layout: One level     Bathroom Shower/Tub: Tub/shower unit Shower/tub characteristics: Engineer, building servicesCurtain Bathroom Toilet: Standard                Prior Functioning/Environment               OT Diagnosis: Generalized weakness;Acute pain   OT Problem List: Decreased strength;Decreased range of motion;Impaired balance (sitting and/or standing);Decreased activity tolerance;Decreased safety awareness;Decreased knowledge of use of DME or AE;Decreased knowledge of precautions;Obesity;Pain   OT Treatment/Interventions: Self-care/ADL training;Therapeutic exercise;Energy conservation;DME and/or AE instruction;Therapeutic activities;Patient/family education;Balance training    OT Goals(Current goals can be found in the care plan section) Acute Rehab OT Goals Patient Stated Goal: none stated OT Goal Formulation: With patient Time For Goal Achievement: 06/02/16 Potential to Achieve Goals: Good ADL Goals Pt Will Perform Upper Body Bathing: with min assist;sitting Pt Will Perform Lower Body Bathing: with min assist;sit to/from stand Pt Will Transfer to Toilet: with min assist;stand pivot transfer;bedside commode Pt Will Perform Toileting - Clothing Manipulation and hygiene: with min assist;sitting/lateral leans;sit to/from stand  OT Frequency: Min 2X/week   Barriers to D/C: Decreased caregiver support  Lives with elderly wife who cannot provide physical assistance.       Co-evaluation PT/OT/SLP Co-Evaluation/Treatment: Yes Reason for Co-Treatment: For patient/therapist safety   OT goals addressed during session: ADL's and self-care;Strengthening/ROM      End of Session  Equipment Utilized During Treatment: Gait belt;Oxygen Nurse Communication: Mobility status;Need for lift equipment  Activity Tolerance: Patient limited by pain Patient left: in chair;with call bell/phone within reach;with chair alarm set   Time: [id number] OT Time Calculation (min): 29 min Charges:  OT General Charges $OT Visit: 1 Procedure OT Evaluation $OT Eval Moderate Complexity: 1 Procedure G-Codes:    Nils PyleJulia Maisey Deandrade, OTR/L Pager: [phone]: [phone] 05/19/2016, 4:17 PM

## 2016-05-19 NOTE — Evaluation (Signed)
Physical Therapy Evaluation Patient Details Name: Dustin [name] MRN: [id number] DOB: [birth year] Today's Date: 05/19/2016   History of Present Illness  HPI: Dustin [name] is a 82 y.o. male with PMH of HTN, CAD h/o CABG, AAA s/p Stent, CHF, COPD, Pulmonary Fibrosis on Home oxygen presented after mechanical fall and hip pain s/p IM Nail L hip, TDWB  Clinical Impression  Patient is s/p above surgery resulting in functional limitations due to the deficits listed below (see PT Problem List).  Patient will benefit from skilled PT to increase their independence and safety with mobility to allow discharge to the venue listed below.       Follow Up Recommendations SNF    Equipment Recommendations  Rolling walker with 5" wheels;3in1 (PT) (perhaps drop-arm 3in1)    Recommendations for Other Services       Precautions / Restrictions Precautions Precautions: Fall Restrictions Weight Bearing Restrictions: Yes LLE Weight Bearing: Touchdown weight bearing      Mobility  Bed Mobility Overal bed mobility: Needs Assistance;+2 for physical assistance Bed Mobility: Supine to Sit     Supine to sit: Total assist;+2 for physical assistance;HOB elevated     General bed mobility comments: Assist to move bilateral LEs and for trunk support to come to sitting position. Mod assist to maintain sitting balance. VCs for hand placement throughout for hand placement and safety.  Transfers Overall transfer level: Needs assistance Equipment used: 2 person hand held assist Transfers: Lateral/Scoot Transfers          Lateral/Scoot Transfers: Total assist;+2 physical assistance General transfer comment: Assist to scoot hips to R side using bed pad and for trunk control throughout transfer. VCs throughout for safety and hand placement.   Ambulation/Gait                Stairs            Wheelchair Mobility    Modified Rankin (Stroke Patients Only)       Balance Overall balance  assessment: Needs assistance Sitting-balance support: Bilateral upper extremity supported;Feet supported Sitting balance-Leahy Scale: Poor Sitting balance - Comments: Mod assist to maintain safe upright position Postural control: Posterior lean                                   Pertinent Vitals/Pain Pain Assessment: 0-10 Pain Score: 7  Pain Location: LLE with movement Pain Descriptors / Indicators: Aching;Grimacing;Moaning Pain Intervention(s): Monitored during session;Limited activity within patient's tolerance;Premedicated before session;Repositioned    Home Living Family/patient expects to be discharged to:: Private residence Living Arrangements: Spouse/significant other Available Help at Discharge: Family;Available 24 hours/day;Available PRN/intermittently (daughters live nearby) Type of Home: House Home Access: Stairs to enter Entrance Stairs-Rails: Right;Left   Home Layout: One level        Prior Function                 Hand Dominance   Dominant Hand: Right    Extremity/Trunk Assessment   Upper Extremity Assessment: Defer to OT evaluation           Lower Extremity Assessment: Generalized weakness;LLE deficits/detail   LLE Deficits / Details: Grossly decr AROM and strength throughout LLE, limteid by pain     Communication   Communication: No difficulties  Cognition Arousal/Alertness: Awake/alert Behavior During Therapy: Flat affect Overall Cognitive Status: Within Functional Limits for tasks assessed (minimal verbal output during session)  General Comments      Exercises        Assessment/Plan    PT Assessment Patient needs continued PT services  PT Diagnosis Acute pain;Generalized weakness;Difficulty walking;Abnormality of gait   PT Problem List Decreased strength;Decreased range of motion;Decreased activity tolerance;Decreased balance;Decreased mobility;Decreased coordination;Decreased  cognition;Decreased knowledge of use of DME;Decreased safety awareness;Decreased knowledge of precautions;Pain  PT Treatment Interventions DME instruction;Gait training;Functional mobility training;Therapeutic activities;Therapeutic exercise;Balance training;Neuromuscular re-education;Cognitive remediation;Patient/family education   PT Goals (Current goals can be found in the Care Plan section) Acute Rehab PT Goals Patient Stated Goal: none stated PT Goal Formulation: With patient Time For Goal Achievement: 06/02/16 Potential to Achieve Goals: Fair    Frequency Min 3X/week   Barriers to discharge        Co-evaluation PT/OT/SLP Co-Evaluation/Treatment: Yes Reason for Co-Treatment: For patient/therapist safety PT goals addressed during session: Mobility/safety with mobility OT goals addressed during session: ADL's and self-care;Strengthening/ROM       End of Session Equipment Utilized During Treatment:  (bed pad) Activity Tolerance: Patient limited by pain Patient left: in chair;with call bell/phone within reach;with chair alarm set Nurse Communication: Mobility status;Other (comment) (option to drawsheet back to bed)         Time: [id number] PT Time Calculation (min) (ACUTE ONLY): 31 min   Charges:   PT Evaluation $PT Eval Moderate Complexity: 1 Procedure     PT G CodesOlen Pel:        Corissa Oguinn Hamff 05/19/2016, 5:51 PM  Van ClinesHolly Brogen [place], [place]PT  Acute Rehabilitation Services Pager [phone] Office [phone]

## 2016-05-19 NOTE — Progress Notes (Signed)
[facility] Progress Note  Patient: Dustin [name]:[id number]   PCP: Crawford Givens, MD DOB: Aug 17, 1926   DOA: 05/18/2016   DOS: 05/19/2016   Date of Service: the patient was seen and examined on 05/19/2016  Subjective: The patient mentions that his pain is not well controlled. Denies any other complaints of chest pain shortness of breath nausea or vomiting no fever no chills no dizziness or lightheadedness. The fall was mechanical. He tolerated the procedure well. Nutrition: Tolerating oral diet  Brief hospital course: Patient was admitted on 05/18/2016, with complaint of mechanical fall, was found to have left hip fracture. Patient underwent intramedullary nail procedure 05/18/2016. Patient also had complaints of progressively worsening leg swelling for last few months. Doppler was negative for DVT and echocardiogram showed diastolic dysfunction with preserved EF. Renal function as well as hemoglobin worsened after the surgery. Currently further plan is continue monitoring for postoperative recovery.  Assessment and Plan: 1. Hip fracture, left (HCC) Status post intramedullary nail implant 05/18/2016. Continue as needed pain management. PTOT consult pending.  2. Acute kidney injury. Postoperative. Likely prerenal. Continue IV hydration. Continue Foley catheter for today.  3. Postoperative acute blood loss anemia. Hemogram dropped from 11-8. We'll continue to monitor. Recheck CBC in the evening. If dropping down less than 7 transfuse as needed.  4. Chronic diastolic congestive heart failure. At present no evidence of volume overload although the patient mentions that he has been noticing some leg swelling. Continue to monitor for volume overload while the patient is receiving IV hydration for his acute kidney injury.  5. Essential hypertension Patient is on Cardizem 300 mg at home. With low blood pressure I will reduce the Cardizem from 300-240 mg.  6. Coronary artery  disease, status post CABG. Continue aspirin continue Lipitor.   Pain management: When necessary Norco, when necessary Robaxin Activity: Pending physical therapy Bowel regimen: last BM prior to admission Diet: Cardiac diet DVT Prophylaxis: subcutaneous Heparin  Advance goals of care discussion: Full code  Family Communication: NO family was present at bedside, at the time of interview.   Disposition:  Discharge to home health versus rehabilitation pending recovery, Expected discharge date: 05/21/2016, pending stabilization of CBC as well as renal function  Consultants: ORTHOPEDIC Procedures: Left intramedullary nail implantation 05/18/2016  Antibiotics: Anti-infectives    Start     Dose/Rate Route Frequency Ordered Stop   05/19/16 0000  ceFAZolin (ANCEF) IVPB 2g/100 mL premix     2 g 200 mL/hr over 30 Minutes Intravenous Every 6 hours 05/18/16 2015 05/19/16 0541   05/18/16 1653  ceFAZolin (ANCEF) 2-4 GM/100ML-% IVPB    Comments:  Shireen Quan   : cabinet override      05/18/16 1653 05/18/16 1710        Intake/Output Summary (Last 24 hours) at 05/19/16 1618 Last data filed at 05/19/16 0858  Gross per 24 hour  Intake   1550 ml  Output    825 ml  Net    725 ml   Filed Weights   05/18/16 1443 05/19/16 0457  Weight: 85.911 kg (189 lb 6.4 oz) 86.637 kg (191 lb)    Objective: Physical Exam: Filed Vitals:   05/18/16 2300 05/19/16 0104 05/19/16 0457 05/19/16 1201  BP: 127/58 115/47 111/46 106/38  Pulse: 83 77 77 65  Temp: 99.1 F (37.3 C) 98.4 F (36.9 C) 98.4 F (36.9 C) 97.5 F (36.4 C)  TempSrc: Oral Oral Oral   Resp: 20 18 18 18   Height:  Weight:   86.637 kg (191 lb)   SpO2: 98% 97% 97% 95%    General: Alert, Awake and Oriented to Time, Place and Person. Appear in mild distress Eyes: PERRL, Conjunctiva normal ENT: Oral Mucosa clear moist. Neck: NO JVD, NO Abnormal Mass Or lumps Cardiovascular: S1 and S2 Present, NO Murmur, Peripheral Pulses  Present Respiratory: Bilateral Air entry equal and Decreased, Clear to Auscultation, no Crackles, no wheezes Abdomen: Bowel Sound present, Soft and no tenderness Skin: no redness, no Rash  Extremities: bilateral Pedal edema, no calf tenderness Neurologic: Grossly no focal neuro deficit. Bilaterally Equal motor strength  Data Reviewed: CBC:  Recent Labs Lab 05/18/16 1111 05/19/16 0453  WBC 8.8 11.9*  NEUTROABS 7.4  --   HGB 11.0* 8.2*  HCT 35.8* 26.3*  MCV 86.1 86.8  PLT 137* 122*   Basic Metabolic Panel:  Recent Labs Lab 05/18/16 1111 05/19/16 0453  NA 140 140  K 3.6 4.5  CL 105 106  CO2 29 27  GLUCOSE 126* 144*  BUN 17 24*  CREATININE 1.12 1.62*  CALCIUM 8.4* 7.9*    Liver Function Tests:  Recent Labs Lab 05/18/16 1111  AST 13*  ALT 11*  ALKPHOS 61  BILITOT 0.7  PROT 5.1*  ALBUMIN 2.9*   No results for input(s): LIPASE, AMYLASE in the last 168 hours. No results for input(s): AMMONIA in the last 168 hours. Coagulation Profile: No results for input(s): INR, PROTIME in the last 168 hours. Cardiac Enzymes: No results for input(s): CKTOTAL, CKMB, CKMBINDEX, TROPONINI in the last 168 hours. BNP (last 3 results)  Recent Labs  09/11/15 1652  PROBNP 117.0*    CBG: No results for input(s): GLUCAP in the last 168 hours.  Studies: Dg C-arm 61-120 Min  05/18/2016  CLINICAL DATA:  Patient status post ORIF left femur fracture. EXAM: DG C-ARM 61-120 MIN; LEFT FEMUR 2 VIEWS COMPARISON:  Hip radiograph 05/18/2016. FINDINGS: Patient status post intra medullary rod and screw fixation of comminuted intratrochanteric left femur fracture. Improved anatomic alignment at the fracture site. Four intraoperative fluoroscopic images were submitted. IMPRESSION: Patient status post intra medullary rod fixation comminuted proximal left femur fracture. Electronically Signed   By: Annia Belt M.D.   On: 05/18/2016 18:47   Dg Femur Min 2 Views Left  05/18/2016  CLINICAL DATA:   Patient status post ORIF left femur fracture. EXAM: DG C-ARM 61-120 MIN; LEFT FEMUR 2 VIEWS COMPARISON:  Hip radiograph 05/18/2016. FINDINGS: Patient status post intra medullary rod and screw fixation of comminuted intratrochanteric left femur fracture. Improved anatomic alignment at the fracture site. Four intraoperative fluoroscopic images were submitted. IMPRESSION: Patient status post intra medullary rod fixation comminuted proximal left femur fracture. Electronically Signed   By: Annia Belt M.D.   On: 05/18/2016 18:47     Scheduled Meds: . allopurinol  300 mg Oral Daily  . atorvastatin  20 mg Oral q1800  . [START ON 05/20/2016] diltiazem  240 mg Oral Daily  . docusate sodium  100 mg Oral BID  . docusate sodium  200 mg Oral Daily  . dutasteride  0.5 mg Oral QHS  . enoxaparin (LOVENOX) injection  30 mg Subcutaneous Q24H  . ferrous sulfate  325 mg Oral Q breakfast   Continuous Infusions: . sodium chloride 100 mL/hr at 05/19/16 0823   PRN Meds: acetaminophen **OR** acetaminophen, alum & mag hydroxide-simeth, bisacodyl, HYDROcodone-acetaminophen, magnesium citrate, methocarbamol **OR** methocarbamol (ROBAXIN)  IV, morphine injection, ondansetron **OR** ondansetron (ZOFRAN) IV, polyethylene glycol, zolpidem  Time spent:  30 minutes  Author: Lynden OxfordPranav Katia Hannen, MD [facility] Pager: [phone] 05/19/2016 4:18 PM  If 7PM-7AM, please contact night-coverage at [email or web address], password [email or web address]

## 2016-05-19 NOTE — Progress Notes (Signed)
Subjective: 1 Day Post-Op Procedure(s) (LRB): INTRAMEDULLARY (IM) NAIL INTERTROCHANTRIC (Left) Patient reports pain as moderate.  Out of bed yet.  Patient seen at 9:45 AM today. Taking by mouth okay.  Concerned that he has not had a BM in 2 days.  Objective: Vital signs in last 24 hours: Temp:  [97.3 F (36.3 C)-99.1 F (37.3 C)] 97.5 F (36.4 C) (05/28 1201) Pulse Rate:  [65-99] 65 (05/28 1201) Resp:  [18-30] 18 (05/28 1201) BP: (106-208)/(38-90) 106/38 mmHg (05/28 1201) SpO2:  [93 %-100 %] 95 % (05/28 1201) Weight:  [85.911 kg (189 lb 6.4 oz)-86.637 kg (191 lb)] 86.637 kg (191 lb) (05/28 0457)  Intake/Output from previous day: 05/27 0701 - 05/28 0700 In: 1310 [P.O.:360; I.V.:750; IV Piggyback:200] Out: 825 [Urine:575; Blood:250] Intake/Output this shift: Total I/O In: 240 [P.O.:240] Out: -    Recent Labs  05/18/16 1111 05/19/16 0453  HGB 11.0* 8.2*    Recent Labs  05/18/16 1111 05/19/16 0453  WBC 8.8 11.9*  RBC 4.16* 3.03*  HCT 35.8* 26.3*  PLT 137* 122*    Recent Labs  05/18/16 1111 05/19/16 0453  NA 140 140  K 3.6 4.5  CL 105 106  CO2 29 27  BUN 17 24*  CREATININE 1.12 1.62*  GLUCOSE 126* 144*  CALCIUM 8.4* 7.9*   The patient is alert and oriented. Neurovascular intact Intact pulses distally Dorsiflexion/Plantar flexion intact Incision: dressing C/D/I Compartment soft  Assessment/Plan: 1 Day Post-Op Procedure(s) (LRB): INTRAMEDULLARY (IM) NAIL INTERTROCHANTRIC (Left)  Plan: Touchdown weightbearing on left lower extremity. Up with therapy Discharge to SNF in a couple of days. Lovenox 30 mg subcutaneous daily for DVT prophylaxis.  When discharged would send him on enteric-coated aspirin 325 mg daily for DVT prophylaxis. We will follow.  Nelia Rogoff G 05/19/2016, 2:10 PM

## 2016-05-20 ENCOUNTER — Encounter (HOSPITAL_COMMUNITY): Payer: Self-pay | Admitting: Orthopedic Surgery

## 2016-05-20 ENCOUNTER — Inpatient Hospital Stay (HOSPITAL_COMMUNITY): Payer: Medicare Other

## 2016-05-20 LAB — URINALYSIS, ROUTINE W REFLEX MICROSCOPIC
Bilirubin Urine: NEGATIVE
Glucose, UA: NEGATIVE mg/dL
KETONES UR: 15 mg/dL — AB
Leukocytes, UA: NEGATIVE
Nitrite: NEGATIVE
PH: 5 (ref 5.0–8.0)
PROTEIN: 100 mg/dL — AB
SPECIFIC GRAVITY, URINE: 1.025 (ref 1.005–1.030)

## 2016-05-20 LAB — CBC WITH DIFFERENTIAL/PLATELET
Basophils Absolute: 0 10*3/uL (ref 0.0–0.1)
Basophils Relative: 0 %
EOS ABS: 0.2 10*3/uL (ref 0.0–0.7)
EOS PCT: 2 %
HCT: 25.1 % — ABNORMAL LOW (ref 39.0–52.0)
HEMOGLOBIN: 7.8 g/dL — AB (ref 13.0–17.0)
LYMPHS ABS: 1.1 10*3/uL (ref 0.7–4.0)
LYMPHS PCT: 9 %
MCH: 27 pg (ref 26.0–34.0)
MCHC: 31.1 g/dL (ref 30.0–36.0)
MCV: 86.9 fL (ref 78.0–100.0)
MONOS PCT: 10 %
Monocytes Absolute: 1.3 10*3/uL — ABNORMAL HIGH (ref 0.1–1.0)
NEUTROS PCT: 79 %
Neutro Abs: 10 10*3/uL — ABNORMAL HIGH (ref 1.7–7.7)
Platelets: 110 10*3/uL — ABNORMAL LOW (ref 150–400)
RBC: 2.89 MIL/uL — ABNORMAL LOW (ref 4.22–5.81)
RDW: 15.4 % (ref 11.5–15.5)
WBC: 12.7 10*3/uL — ABNORMAL HIGH (ref 4.0–10.5)

## 2016-05-20 LAB — RENAL FUNCTION PANEL
ANION GAP: 6 (ref 5–15)
Albumin: 2.3 g/dL — ABNORMAL LOW (ref 3.5–5.0)
BUN: 41 mg/dL — ABNORMAL HIGH (ref 6–20)
CALCIUM: 7.7 mg/dL — AB (ref 8.9–10.3)
CHLORIDE: 107 mmol/L (ref 101–111)
CO2: 26 mmol/L (ref 22–32)
CREATININE: 2.53 mg/dL — AB (ref 0.61–1.24)
GFR, EST AFRICAN AMERICAN: 25 mL/min — AB (ref >60)
GFR, EST NON AFRICAN AMERICAN: 21 mL/min — AB (ref >60)
Glucose, Bld: 136 mg/dL — ABNORMAL HIGH (ref 65–99)
Phosphorus: 4.7 mg/dL — ABNORMAL HIGH (ref 2.5–4.6)
Potassium: 4.5 mmol/L (ref 3.5–5.1)
SODIUM: 139 mmol/L (ref 135–145)

## 2016-05-20 LAB — COMPREHENSIVE METABOLIC PANEL
ALBUMIN: 2.4 g/dL — AB (ref 3.5–5.0)
ALK PHOS: 48 U/L (ref 38–126)
ALT: 6 U/L — AB (ref 17–63)
AST: 17 U/L (ref 15–41)
Anion gap: 6 (ref 5–15)
BUN: 36 mg/dL — AB (ref 6–20)
CALCIUM: 7.6 mg/dL — AB (ref 8.9–10.3)
CHLORIDE: 106 mmol/L (ref 101–111)
CO2: 27 mmol/L (ref 22–32)
CREATININE: 2.56 mg/dL — AB (ref 0.61–1.24)
GFR calc non Af Amer: 21 mL/min — ABNORMAL LOW (ref >60)
GFR, EST AFRICAN AMERICAN: 24 mL/min — AB (ref >60)
GLUCOSE: 139 mg/dL — AB (ref 65–99)
Potassium: 4.3 mmol/L (ref 3.5–5.1)
SODIUM: 139 mmol/L (ref 135–145)
Total Bilirubin: 0.4 mg/dL (ref 0.3–1.2)
Total Protein: 4.4 g/dL — ABNORMAL LOW (ref 6.5–8.1)

## 2016-05-20 LAB — PROTEIN / CREATININE RATIO, URINE
CREATININE, URINE: 240.34 mg/dL
TOTAL PROTEIN, URINE: 137 mg/dL

## 2016-05-20 LAB — CBC
HCT: 28.1 % — ABNORMAL LOW (ref 39.0–52.0)
HEMOGLOBIN: 8.9 g/dL — AB (ref 13.0–17.0)
MCH: 28.1 pg (ref 26.0–34.0)
MCHC: 31.7 g/dL (ref 30.0–36.0)
MCV: 88.6 fL (ref 78.0–100.0)
PLATELETS: 105 10*3/uL — AB (ref 150–400)
RBC: 3.17 MIL/uL — AB (ref 4.22–5.81)
RDW: 14.9 % (ref 11.5–15.5)
WBC: 14.3 10*3/uL — AB (ref 4.0–10.5)

## 2016-05-20 LAB — ABO/RH: ABO/RH(D): O POS

## 2016-05-20 LAB — CREATININE, URINE, RANDOM: Creatinine, Urine: 241.93 mg/dL

## 2016-05-20 LAB — URIC ACID: URIC ACID, SERUM: 5.4 mg/dL (ref 4.4–7.6)

## 2016-05-20 LAB — SODIUM, URINE, RANDOM

## 2016-05-20 LAB — PREPARE RBC (CROSSMATCH)

## 2016-05-20 LAB — URINE MICROSCOPIC-ADD ON
Bacteria, UA: NONE SEEN
RBC / HPF: NONE SEEN RBC/hpf (ref 0–5)

## 2016-05-20 LAB — IRON AND TIBC
Iron: 45 ug/dL (ref 45–182)
Saturation Ratios: 25 % (ref 17.9–39.5)
TIBC: 183 ug/dL — AB (ref 250–450)
UIBC: 138 ug/dL

## 2016-05-20 LAB — CK: CK TOTAL: 615 U/L — AB (ref 49–397)

## 2016-05-20 MED ORDER — SENNOSIDES-DOCUSATE SODIUM 8.6-50 MG PO TABS
1.0000 | ORAL_TABLET | Freq: Two times a day (BID) | ORAL | Status: DC
  Administered 2016-05-20 – 2016-05-23 (×6): 1 via ORAL
  Filled 2016-05-20 (×6): qty 1

## 2016-05-20 MED ORDER — CARVEDILOL 3.125 MG PO TABS
3.1250 mg | ORAL_TABLET | Freq: Two times a day (BID) | ORAL | Status: DC
  Administered 2016-05-21 – 2016-05-23 (×5): 3.125 mg via ORAL
  Filled 2016-05-20 (×5): qty 1

## 2016-05-20 MED ORDER — DILTIAZEM HCL ER COATED BEADS 120 MG PO CP24: 120.0000 mg | ORAL_CAPSULE | Freq: Every day | ORAL | Status: DC

## 2016-05-20 MED ORDER — HEPARIN SODIUM (PORCINE) 5000 UNIT/ML IJ SOLN
5000.0000 [IU] | Freq: Three times a day (TID) | INTRAMUSCULAR | Status: DC
  Administered 2016-05-20 – 2016-05-23 (×8): 5000 [IU] via SUBCUTANEOUS
  Filled 2016-05-20 (×8): qty 1

## 2016-05-20 MED ORDER — POLYETHYLENE GLYCOL 3350 17 G PO PACK
17.0000 g | PACK | Freq: Two times a day (BID) | ORAL | Status: DC
  Administered 2016-05-20 – 2016-05-22 (×5): 17 g via ORAL
  Filled 2016-05-20 (×5): qty 1

## 2016-05-20 MED ORDER — SODIUM CHLORIDE 0.9 % IV SOLN
Freq: Once | INTRAVENOUS | Status: AC
  Administered 2016-05-20: 07:00:00 via INTRAVENOUS

## 2016-05-20 NOTE — Progress Notes (Signed)
Subjective: 2 Days Post-Op Procedure(s) (LRB): INTRAMEDULLARY (IM) NAIL INTERTROCHANTRIC (Left) Patient reports pain as mild. Just finished receiving packed RBCs. Patient in bed.  Objective: Vital signs in last 24 hours: Temp:  [97.8 F (36.6 C)-98.5 F (36.9 C)] 98.2 F (36.8 C) (05/29 1152) Pulse Rate:  [54-63] 63 (05/29 0918) Resp:  [16-20] 20 (05/29 1152) BP: (117-130)/(30-42) 130/38 mmHg (05/29 1152) SpO2:  [93 %-96 %] 94 % (05/29 1152) Weight:  [87.49 kg (192 lb 14.1 oz)] 87.49 kg (192 lb 14.1 oz) (05/29 0359)  Intake/Output from previous day: 05/28 0701 - 05/29 0700 In: 700 [P.O.:700] Out: 589 [Urine:589] Intake/Output this shift: Total I/O In: 120 [P.O.:120] Out: -    Recent Labs  05/18/16 1111 05/19/16 0453 05/20/16 0436  HGB 11.0* 8.2* 7.8*    Recent Labs  05/19/16 0453 05/20/16 0436  WBC 11.9* 12.7*  RBC 3.03* 2.89*  HCT 26.3* 25.1*  PLT 122* 110*    Recent Labs  05/19/16 0453 05/20/16 0436  NA 140 139  K 4.5 4.3  CL 106 106  CO2 27 27  BUN 24* 36*  CREATININE 1.62* 2.56*  GLUCOSE 144* 139*  CALCIUM 7.9* 7.6*   No results for input(s): LABPT, INR in the last 72 hours. Patient moderately somnolent. Left hip exam: Neurovascular intact Sensation intact distally Intact pulses distally Dorsiflexion/Plantar flexion intact Incision: dressing C/D/I Compartment soft  Assessment/Plan: 2 Days Post-Op Procedure(s) (LRB): INTRAMEDULLARY (IM) NAIL INTERTROCHANTRIC (Left) Acute blood loss anemia. Plan: Agree with transfusion of packed RBCs. Recheck CBC in a.m. Up with therapy Discharge to SNF possibly tomorrow. Touchdown weightbearing on left. Out of bed to chair as much as possible. Would probably use aspirin 325 mg enteric-coated once daily 1 month postop for DVT prophylaxis after discharge. Follow-up at Dr. Luiz BlareGraves office in 2 weeks.    Margene Cherian G 05/20/2016, 12:41 PM

## 2016-05-20 NOTE — Progress Notes (Signed)
@   2111 on 05/19/16 pt had a large amount of inc.of urine. Was cleaned by this Clinical research associatewriter and NT. Shortly prior to  0300 no urine was in the condom cath  bag. Bladder scan  Showed the highest of1Straight 79 cc.Patient was straight cathed for 150 CC @ 0400. Post void showed highest @ 0410

## 2016-05-20 NOTE — Consult Note (Signed)
Reason for Consult:AKI Referring Physician: Dr. Darlin Drop is an 82 y.o. male.  HPI: 82 yr male admitted on 5/27 for fall with L hip fx.  Hx HTN, CAD with CABG 1998, divertic dz, Gout, ^ lipids, Melanoma of sking, BPH, AAA with stent, COPD with pulm fibrosis on O2. Cr on admit 1.12 and today 2.56,.  Hb 11 on admit and 7.8 today.  Sys bps as low as 100 also.  NKA , no NSAIDS, or ACEI.  Primary c/o is lower abdm and L hip pain.  Mother was on Dialysis, unknown cause. Constitutional: as above, sleepy, falls asleep and willnot answer some questions Eyes: glasses Ears, nose, mouth, throat, and face: negative Respiratory: a little SOB now Cardiovascular: more edema in past mon,  No CP Gastrointestinal: lower abdm pain , no post op BM Genitourinary:some prob passing urine Integument/breast: negative Musculoskeletal:L hip pain Neurological: knows he is lethargic now and not clear to questions Endocrine: negative Allergic/Immunologic: negative   Past Medical History  Diagnosis Date  . Hyperlipidemia   . Hypertension   . Abdominal aortic aneurysm (HCC)     with endoleak per Dr. Inez Pilgrim, aneurysmal sac size stable as of 12/11 (7.5 x 6.1cm)  . Benign prostatic hypertrophy   . History of chickenpox   . Dysuria   . Heart disease   . Gout   . Sigmoid diverticulosis   . Melanoma in situ [facility])     Per [facility] 2014  . MI (myocardial infarction) ([place])   . Coronary artery disease   . Pulmonary fibrosis ([place])   . CHF (congestive heart failure) ([place])   . Emphysema lung [facility])     Past Surgical History  Procedure Laterality Date  . Hernia repair  2011    Right  . Coronary artery bypass graft  1991  . Bilateral cataract surgery  2011  . Aaa stent  2008  . Left cea  1997    Family History  Problem Relation Age of Onset  . Stroke Mother   . Heart disease Father 21    Sudden death  . Heart attack Father     Social History:  reports that he quit smoking  about 27 years ago. His smoking use included Cigarettes. He has a 40 pack-year smoking history. He has never used smokeless tobacco. He reports that he drinks alcohol. He reports that he does not use illicit drugs.  Allergies: No Known Allergies  Medications:  I have reviewed the patient's current medications. Prior to Admission:  Prescriptions prior to admission  Medication Sig Dispense Refill Last Dose  . allopurinol (ZYLOPRIM) 300 MG tablet Take 300 mg by mouth daily.   05/17/2016 at Unknown time  . aspirin 81 MG tablet Take 81 mg by mouth daily.   05/17/2016 at Unknown time  . atorvastatin (LIPITOR) 20 MG tablet Take 1 tablet (20 mg total) by mouth daily at 6 PM. 30 tablet 0 05/17/2016 at Unknown time  . cholecalciferol (VITAMIN D) 1000 UNITS tablet Take 1,000 Units by mouth daily.    05/17/2016 at Unknown time  . diltiazem (TIAZAC) 300 MG 24 hr capsule Take 300 mg by mouth daily.   05/17/2016 at Unknown time  . docusate sodium (COLACE) 100 MG capsule Take 200 mg by mouth daily.   05/17/2016 at Unknown time  . dutasteride (AVODART) 0.5 MG capsule Take 0.5 mg by mouth at bedtime.    05/17/2016 at Unknown time  . losartan (COZAAR) 50 MG tablet  Take 50 mg by mouth daily.    05/17/2016 at Unknown time  . vitamin C (ASCORBIC ACID) 500 MG tablet Take 1,000 mg by mouth daily.    05/17/2016 at Unknown time  . vitamin E 1000 UNIT capsule Take 1,000 Units by mouth daily.   05/17/2016 at Unknown time  . clotrimazole-betamethasone (LOTRISONE) cream APPLY TO AFFECTED AREA TWICE DAILY AS NEEDED (Patient not taking: Reported on 05/18/2016) 30 g 0 Not Taking at Unknown time  . diltiazem (TIAZAC) 240 MG 24 hr capsule Take 1 capsule (240 mg total) by mouth daily. (Patient not taking: Reported on 05/18/2016)   Not Taking at Unknown time  . potassium chloride SA (K-DUR,KLOR-CON) 20 MEQ tablet Take with lasix (Patient not taking: Reported on 05/18/2016) 30 tablet 1 Not Taking at Unknown time  . [DISCONTINUED] furosemide  (LASIX) 40 MG tablet Take in the AM if your weight is above 185 pounds (Patient not taking: Reported on 05/18/2016) 30 tablet 1 Not Taking at Unknown time    Results for orders placed or performed during the hospital encounter of 05/18/16 (from the past 48 hour(s))  CBC     Status: Abnormal   Collection Time: 05/19/16  4:53 AM  Result Value Ref Range   WBC 11.9 (H) 4.0 - 10.5 K/uL   RBC 3.03 (L) 4.22 - 5.81 MIL/uL   Hemoglobin 8.2 (L) 13.0 - 17.0 g/dL    Comment: DELTA CHECK NOTED REPEATED TO VERIFY    HCT 26.3 (L) 39.0 - 52.0 %   MCV 86.8 78.0 - 100.0 fL   MCH 27.1 26.0 - 34.0 pg   MCHC 31.2 30.0 - 36.0 g/dL   RDW 15.4 11.5 - 15.5 %   Platelets 122 (L) 150 - 400 K/uL  Basic metabolic panel     Status: Abnormal   Collection Time: 05/19/16  4:53 AM  Result Value Ref Range   Sodium 140 135 - 145 mmol/L   Potassium 4.5 3.5 - 5.1 mmol/L    Comment: DELTA CHECK NOTED   Chloride 106 101 - 111 mmol/L   CO2 27 22 - 32 mmol/L   Glucose, Bld 144 (H) 65 - 99 mg/dL   BUN 24 (H) 6 - 20 mg/dL   Creatinine, Ser 1.62 (H) 0.61 - 1.24 mg/dL   Calcium 7.9 (L) 8.9 - 10.3 mg/dL   GFR calc non Af Amer 37 (L) >60 mL/min   GFR calc Af Amer 42 (L) >60 mL/min    Comment: (NOTE) The eGFR has been calculated using the CKD EPI equation. This calculation has not been validated in all clinical situations. eGFR's persistently <60 mL/min signify possible Chronic Kidney Disease.    Anion gap 7 5 - 15  CBC with Differential/Platelet     Status: Abnormal   Collection Time: 05/20/16  4:36 AM  Result Value Ref Range   WBC 12.7 (H) 4.0 - 10.5 K/uL   RBC 2.89 (L) 4.22 - 5.81 MIL/uL   Hemoglobin 7.8 (L) 13.0 - 17.0 g/dL   HCT 25.1 (L) 39.0 - 52.0 %   MCV 86.9 78.0 - 100.0 fL   MCH 27.0 26.0 - 34.0 pg   MCHC 31.1 30.0 - 36.0 g/dL   RDW 15.4 11.5 - 15.5 %   Platelets 110 (L) 150 - 400 K/uL    Comment: SPECIMEN CHECKED FOR CLOTS REPEATED TO VERIFY PLATELET COUNT CONFIRMED BY SMEAR    Neutrophils Relative  % 79 %   Neutro Abs 10.0 (H) 1.7 - 7.7 K/uL  Lymphocytes Relative 9 %   Lymphs Abs 1.1 0.7 - 4.0 K/uL   Monocytes Relative 10 %   Monocytes Absolute 1.3 (H) 0.1 - 1.0 K/uL   Eosinophils Relative 2 %   Eosinophils Absolute 0.2 0.0 - 0.7 K/uL   Basophils Relative 0 %   Basophils Absolute 0.0 0.0 - 0.1 K/uL  Comprehensive metabolic panel     Status: Abnormal   Collection Time: 05/20/16  4:36 AM  Result Value Ref Range   Sodium 139 135 - 145 mmol/L   Potassium 4.3 3.5 - 5.1 mmol/L   Chloride 106 101 - 111 mmol/L   CO2 27 22 - 32 mmol/L   Glucose, Bld 139 (H) 65 - 99 mg/dL   BUN 36 (H) 6 - 20 mg/dL   Creatinine, Ser 2.56 (H) 0.61 - 1.24 mg/dL   Calcium 7.6 (L) 8.9 - 10.3 mg/dL   Total Protein 4.4 (L) 6.5 - 8.1 g/dL   Albumin 2.4 (L) 3.5 - 5.0 g/dL   AST 17 15 - 41 U/L   ALT 6 (L) 17 - 63 U/L   Alkaline Phosphatase 48 38 - 126 U/L   Total Bilirubin 0.4 0.3 - 1.2 mg/dL   GFR calc non Af Amer 21 (L) >60 mL/min   GFR calc Af Amer 24 (L) >60 mL/min    Comment: (NOTE) The eGFR has been calculated using the CKD EPI equation. This calculation has not been validated in all clinical situations. eGFR's persistently <60 mL/min signify possible Chronic Kidney Disease.    Anion gap 6 5 - 15  Prepare RBC     Status: None   Collection Time: 05/20/16  7:10 AM  Result Value Ref Range   Order Confirmation ORDER PROCESSED BY BLOOD BANK   Type and screen [facility]     Status: None (Preliminary result)   Collection Time: 05/20/16  7:10 AM  Result Value Ref Range   ABO/RH(D) O POS    Antibody Screen NEG    Sample Expiration 05/23/2016    Unit Number [id number]    Blood Component Type RED CELLS,LR    Unit division 00    Status of Unit ISSUED    Transfusion Status OK TO TRANSFUSE    Crossmatch Result Compatible   ABO/Rh     Status: None   Collection Time: 05/20/16  7:10 AM  Result Value Ref Range   ABO/RH(D) O POS     Dg C-arm 61-120 Min  05/18/2016  CLINICAL DATA:   Patient status post ORIF left femur fracture. EXAM: DG C-ARM 61-120 MIN; LEFT FEMUR 2 VIEWS COMPARISON:  Hip radiograph 05/18/2016. FINDINGS: Patient status post intra medullary rod and screw fixation of comminuted intratrochanteric left femur fracture. Improved anatomic alignment at the fracture site. Four intraoperative fluoroscopic images were submitted. IMPRESSION: Patient status post intra medullary rod fixation comminuted proximal left femur fracture. Electronically Signed   By: Lovey Newcomer M.D.   On: 05/18/2016 18:47   Dg Femur Min 2 Views Left  05/18/2016  CLINICAL DATA:  Patient status post ORIF left femur fracture. EXAM: DG C-ARM 61-120 MIN; LEFT FEMUR 2 VIEWS COMPARISON:  Hip radiograph 05/18/2016. FINDINGS: Patient status post intra medullary rod and screw fixation of comminuted intratrochanteric left femur fracture. Improved anatomic alignment at the fracture site. Four intraoperative fluoroscopic images were submitted. IMPRESSION: Patient status post intra medullary rod fixation comminuted proximal left femur fracture. Electronically Signed   By: Lovey Newcomer M.D.   On: 05/18/2016 18:47  ROS Blood pressure 130/38, pulse 63, temperature 98.2 F (36.8 C), temperature source Oral, resp. rate 20, height _0  (1.702 m), weight 87.49 kg (192 lb 14.1 oz), SpO2 94 %. Physical Exam Physical Examination: General appearance - pale , stare . sleepy Mental status - Ox2, coop, but not clear on a number of answers Eyes - pupils equal and reactive, extraocular eye movements intact, funduscopic exam normal, discs flat and sharp Mouth - pale Neck - adenopathy noted PCL Lymphatics - posterior cervical nodes Chest - decreased BS, scattered rhonchi Heart - S1 and S2 normal, systolic murmur UG6/4 at apex Abdomen - mod distension , ^ tympany, has erratic bs, tender lower abdm Extremities -  pedal edema 2 +, decreased DP but present, L foot turned in Skin - pale, some bruises, hemangiomata,  moles  Assessment/Plan: 1 AKI most likely hemodyamic from pain meds, inactivity, blood loss (4 units), and sedation.  Need to consider if foley is in bladder or not.  Predisposed to this if Urine protein is truly >351m %.  Need to look at urine, slowly vol expand, get U/S, SPEP, UPEP. 2 Hip fx, signif blood los 3 Hypertension: not an issue 4. Anemia eval Fe status 5. Gout check UA 6 DM 7 AAA consider eval of renal art if nothing pans out. 9 Diastolic CHF P U/S, Urine Na, Cr, UPEP,SPEP, UA, Uric acid, foley , ivf  Ermagene Saidi L 05/20/2016, 12:48 PM

## 2016-05-20 NOTE — Brief Op Note (Signed)
05/18/2016  10:48 PM  PATIENT:  Dustin [name]  82 y.o. male  PRE-OPERATIVE DIAGNOSIS:  clsd displaced intertroch fx  POST-OPERATIVE DIAGNOSIS:  clsd displaced intertroch fx  PROCEDURE:  Procedure(s): INTRAMEDULLARY (IM) NAIL INTERTROCHANTRIC (Left)  SURGEON:  Surgeon(s) and Role:    * Jodi GeraldsJohn Maryanne Huneycutt, MD - Primary  PHYSICIAN ASSISTANT:   ASSISTANTS: bethune   ANESTHESIA:   general  EBL:  Total I/O In: 420 [P.O.:120; I.V.:300] Out: -   BLOOD ADMINISTERED:none  DRAINS: none   LOCAL MEDICATIONS USED:  MARCAINE     SPECIMEN:  No Specimen  DISPOSITION OF SPECIMEN:  N/A  COUNTS:  YES  TOURNIQUET:  * No tourniquets in log *  DICTATION: .Other Dictation: Dictation Number [id number]  PLAN OF CARE: Admit to inpatient   PATIENT DISPOSITION:  PACU - hemodynamically stable.   Delay start of Pharmacological VTE agent (>24hrs) due to surgical blood loss or risk of bleeding: no

## 2016-05-20 NOTE — Progress Notes (Signed)
[facility] Progress Note  Patient: Dustin [name] RUE:[id number]   PCP: Crawford GivensGraham Duncan, MD DOB: 02/13/1926   DOA: 05/18/2016   DOS: 05/20/2016   Date of Service: the patient was seen and examined on 05/20/2016  Subjective: Patient denies any acute complaint. Primarily using Norco for pain and Robaxin with occasional morphine IV. Oral intake is minimal. Although no nausea no vomiting no abdominal pain or diarrhea. Nutrition: Tolerating oral diet  Brief hospital course: Patient was admitted on 05/18/2016, with complaint of mechanical fall, was found to have left hip fracture. Patient underwent intramedullary nail procedure 05/18/2016. Patient also had complaints of progressively worsening leg swelling for last few months. Doppler was negative for DVT and echocardiogram showed diastolic dysfunction with preserved EF. Renal function as well as hemoglobin worsened after the surgery. Currently further plan is continue monitoring for postoperative recovery.  Assessment and Plan: 1. Hip fracture, left (HCC) Status post intramedullary nail implant 05/18/2016. Continue as needed pain management. PTOT consult recommends SNF. Social worker consulted.  2. Acute kidney injury. Postoperative. Likely prerenal. Continue IV hydration. Continue Foley catheter for close monitoring of ins and outs.  appreciate nephrology input.  3. Postoperative acute blood loss anemia. Hemogram dropped from 11-7.8. With worsened renal function I would transfuse the patient with 1 PRBC Recheck CBC in the morning.  4. Chronic diastolic congestive heart failure. At present no evidence of volume overload although the patient mentions that he has been noticing some leg swelling. Continue to monitor for volume overload while the patient is receiving IV hydration for his acute kidney injury.  5. Essential hypertension Patient is on Cardizem 300 mg at home. Discontinue and change to low-dose Coreg.  6. Coronary artery  disease, status post CABG. Continue aspirin continue Lipitor.  7.Constipation. Bowel regimen changed to Senokot scheduled as well as MiraLAX scheduled.   Pain management: When necessary Norco, when necessary Robaxin Activity: SNF per physical therapy Bowel regimen: last BM 05/17/2016 Diet: Cardiac diet DVT Prophylaxis: subcutaneous Heparin  Advance goals of care discussion: Full code  Family Communication: No family was present at bedside, at the time of interview.   Disposition:  Discharge to home health versus rehabilitation pending recovery, Expected discharge date: 05/21/2016, pending stabilization of CBC as well as renal function  Consultants: ORTHOPEDIC Procedures: Left intramedullary nail implantation 05/18/2016  Antibiotics: Anti-infectives    Start     Dose/Rate Route Frequency Ordered Stop   05/19/16 0000  ceFAZolin (ANCEF) IVPB 2g/100 mL premix     2 g 200 mL/hr over 30 Minutes Intravenous Every 6 hours 05/18/16 2015 05/19/16 0541   05/18/16 1653  ceFAZolin (ANCEF) 2-4 GM/100ML-% IVPB    Comments:  Dustin [name], Robert   : cabinet override      05/18/16 1653 05/18/16 1710      Intake/Output Summary (Last 24 hours) at 05/20/16 1810 Last data filed at 05/20/16 1746  Gross per 24 hour  Intake    678 ml  Output    314 ml  Net    364 ml   Filed Weights   05/18/16 1443 05/19/16 0457 05/20/16 0359  Weight: 85.911 kg (189 lb 6.4 oz) 86.637 kg (191 lb) 87.49 kg (192 lb 14.1 oz)   Objective: Physical Exam: Filed Vitals:   05/20/16 0359 05/20/16 0900 05/20/16 0918 05/20/16 1152  BP: 123/42 117/30 125/39 130/38  Pulse: 58 57 63   Temp: 97.8 F (36.6 C) 98.3 F (36.8 C) 98.2 F (36.8 C) 98.2 F (36.8 C)  TempSrc: Oral  Oral Oral Oral  Resp: 18 20 20 20   Height:      Weight: 87.49 kg (192 lb 14.1 oz)     SpO2: 94% 96% 96% 94%   General: Alert, Awake and Oriented to Time, Place and Person. Appear in mild distress Eyes: PERRL, Conjunctiva normal ENT: Oral Mucosa  clear moist. Neck: NO JVD, NO Abnormal Mass Or lumps Cardiovascular: S1 and S2 Present, NO Murmur, Peripheral Pulses Present Respiratory: Bilateral Air entry equal and Decreased, Clear to Auscultation, no Crackles, no wheezes Abdomen: Bowel Sound present, Soft and no tenderness Skin: no redness, no Rash  Extremities: bilateral Pedal edema, no calf tenderness Neurologic: Grossly no focal neuro deficit. Bilaterally Equal motor strength  Data Reviewed: CBC:  Recent Labs Lab 05/18/16 1111 05/19/16 0453 05/20/16 0436 05/20/16 1254  WBC 8.8 11.9* 12.7* 14.3*  NEUTROABS 7.4  --  10.0*  --   HGB 11.0* 8.2* 7.8* 8.9*  HCT 35.8* 26.3* 25.1* 28.1*  MCV 86.1 86.8 86.9 88.6  PLT 137* 122* 110* 105*   Basic Metabolic Panel:  Recent Labs Lab 05/18/16 1111 05/19/16 0453 05/20/16 0436 05/20/16 1254  NA 140 140 139 139  K 3.6 4.5 4.3 4.5  CL 105 106 106 107  CO2 29 27 27 26   GLUCOSE 126* 144* 139* 136*  BUN 17 24* 36* 41*  CREATININE 1.12 1.62* 2.56* 2.53*  CALCIUM 8.4* 7.9* 7.6* 7.7*  PHOS  --   --   --  4.7*    Liver Function Tests:  Recent Labs Lab 05/18/16 1111 05/20/16 0436 05/20/16 1254  AST 13* 17  --   ALT 11* 6*  --   ALKPHOS 61 48  --   BILITOT 0.7 0.4  --   PROT 5.1* 4.4*  --   ALBUMIN 2.9* 2.4* 2.3*   No results for input(s): LIPASE, AMYLASE in the last 168 hours. No results for input(s): AMMONIA in the last 168 hours. Coagulation Profile: No results for input(s): INR, PROTIME in the last 168 hours. Cardiac Enzymes:  Recent Labs Lab 05/20/16 1254  CKTOTAL 615*   BNP (last 3 results)  Recent Labs  09/11/15 1652  PROBNP 117.0*    CBG: No results for input(s): GLUCAP in the last 168 hours.  Studies: US Renal  05/20/2016  CLINICAL DATA:  82 year old hypertensive male with acute renal failure. Initial encounter. EXAM: RENAL / URINARY TRACT ULTRASOUND COMPLETE COMPARISON:  None. FINDINGS: Right Kidney: Length: 10.2 cm. Mild renal parenchymal  thinning. Echogenic renal parenchyma. Findings may indicate changes of medical renal disease. No hydronephrosis or mass noted. Left Kidney: Length: 10.7 cm. 7.1 x 3 x 5.6 cm bilobed cyst mid to upper aspect of the left kidney. Mild renal parenchymal thinning. Echogenic renal parenchyma. Findings may indicate changes of medical renal disease. Bladder: Foley catheter in place and bladder decompressed with evaluation therefore limited. IMPRESSION: No hydronephrosis. Mild bilateral renal parenchymal thinning and increased parenchymal echogenicity. Findings may indicate changes of medical renal disease. Large bilobed left renal cyst measuring up to 7.1 cm. Electronically Signed   By: Lacy Duverney M.D.   On: 05/20/2016 15:46     Scheduled Meds: . allopurinol  300 mg Oral Daily  . atorvastatin  20 mg Oral q1800  . [START ON 05/21/2016] carvedilol  3.125 mg Oral BID WC  . dutasteride  0.5 mg Oral QHS  . ferrous sulfate  325 mg Oral Q breakfast  . heparin subcutaneous  5,000 Units Subcutaneous Q8H  . polyethylene glycol  17  g Oral BID  . senna-docusate  1 tablet Oral BID   Continuous Infusions: . sodium chloride 100 mL/hr at 05/20/16 1704   PRN Meds: acetaminophen **OR** acetaminophen, alum & mag hydroxide-simeth, bisacodyl, HYDROcodone-acetaminophen, magnesium citrate, methocarbamol **OR** methocarbamol (ROBAXIN)  IV, morphine injection, ondansetron **OR** ondansetron (ZOFRAN) IV, zolpidem  Time spent: 30 minutes  Author: Lynden Oxford, MD [facility] Pager: [phone] 05/20/2016 6:10 PM  If 7PM-7AM, please contact night-coverage at [email or web address], password [facility]

## 2016-05-21 LAB — COMPREHENSIVE METABOLIC PANEL
ALT: 5 U/L — AB (ref 17–63)
AST: 19 U/L (ref 15–41)
Albumin: 2 g/dL — ABNORMAL LOW (ref 3.5–5.0)
Alkaline Phosphatase: 47 U/L (ref 38–126)
Anion gap: 4 — ABNORMAL LOW (ref 5–15)
BILIRUBIN TOTAL: 0.3 mg/dL (ref 0.3–1.2)
BUN: 43 mg/dL — AB (ref 6–20)
CO2: 28 mmol/L (ref 22–32)
CREATININE: 2.42 mg/dL — AB (ref 0.61–1.24)
Calcium: 7.4 mg/dL — ABNORMAL LOW (ref 8.9–10.3)
Chloride: 108 mmol/L (ref 101–111)
GFR calc Af Amer: 26 mL/min — ABNORMAL LOW (ref >60)
GFR, EST NON AFRICAN AMERICAN: 22 mL/min — AB (ref >60)
GLUCOSE: 108 mg/dL — AB (ref 65–99)
Potassium: 4.4 mmol/L (ref 3.5–5.1)
Sodium: 140 mmol/L (ref 135–145)
TOTAL PROTEIN: 4.2 g/dL — AB (ref 6.5–8.1)

## 2016-05-21 LAB — CBC WITH DIFFERENTIAL/PLATELET
BASOS ABS: 0 10*3/uL (ref 0.0–0.1)
Basophils Relative: 0 %
Eosinophils Absolute: 0.2 10*3/uL (ref 0.0–0.7)
Eosinophils Relative: 2 %
HEMATOCRIT: 25.3 % — AB (ref 39.0–52.0)
HEMOGLOBIN: 8.1 g/dL — AB (ref 13.0–17.0)
LYMPHS PCT: 7 %
Lymphs Abs: 0.7 10*3/uL (ref 0.7–4.0)
MCH: 28.3 pg (ref 26.0–34.0)
MCHC: 32 g/dL (ref 30.0–36.0)
MCV: 88.5 fL (ref 78.0–100.0)
Monocytes Absolute: 1.3 10*3/uL — ABNORMAL HIGH (ref 0.1–1.0)
Monocytes Relative: 12 %
NEUTROS ABS: 8.2 10*3/uL — AB (ref 1.7–7.7)
NEUTROS PCT: 79 %
Platelets: 117 10*3/uL — ABNORMAL LOW (ref 150–400)
RBC: 2.86 MIL/uL — AB (ref 4.22–5.81)
RDW: 15.2 % (ref 11.5–15.5)
WBC: 10.3 10*3/uL (ref 4.0–10.5)

## 2016-05-21 LAB — TYPE AND SCREEN
ABO/RH(D): O POS
Antibody Screen: NEGATIVE
UNIT DIVISION: 0

## 2016-05-21 LAB — MAGNESIUM: MAGNESIUM: 1.9 mg/dL (ref 1.7–2.4)

## 2016-05-21 MED ORDER — MORPHINE SULFATE (PF) 2 MG/ML IV SOLN: 1.0000 mg | INTRAVENOUS | Status: DC | PRN

## 2016-05-21 MED ORDER — HYDROCODONE-ACETAMINOPHEN 5-325 MG PO TABS
1.0000 | ORAL_TABLET | Freq: Four times a day (QID) | ORAL | Status: DC | PRN
Start: 2016-05-21 — End: 2016-05-23
  Administered 2016-05-21 – 2016-05-23 (×3): 1 via ORAL
  Filled 2016-05-21 (×3): qty 1

## 2016-05-21 MED ORDER — BISACODYL 10 MG RE SUPP: 10.0000 mg | Freq: Every day | RECTAL | Status: DC | PRN

## 2016-05-21 NOTE — Progress Notes (Signed)
[facility] Progress Note  Patient: Dustin [name] WJX:[id number]   PCP: Crawford GivensGraham Duncan, MD DOB: 01/28/1926   DOA: 05/18/2016   DOS: 05/21/2016   Date of Service: the patient was seen and examined on 05/21/2016  Subjective: Patient is somewhat more confused this morning. Denies any acute complaint. No acute events overnight. Continues to have constipation. Minimal oral intake. Nutrition: Tolerating oral diet  Brief hospital course: Patient was admitted on 05/18/2016, with complaint of mechanical fall, was found to have left hip fracture. Patient underwent intramedullary nail procedure 05/18/2016. Patient also had complaints of progressively worsening leg swelling for last few months. Doppler was negative for DVT and echocardiogram showed diastolic dysfunction with preserved EF. Renal function as well as hemoglobin worsened after the surgery. Currently further plan is continue monitoring for postoperative recovery.  Assessment and Plan: 1. Hip fracture, left (HCC) Status post intramedullary nail implant 05/18/2016. Continue as needed pain management.  PTOT consult recommends SNF. Social worker consulted.  2. Acute kidney injury. Postoperative. No stable but urine output continues to remain low. Likely prerenal. Continue IV hydration. Continue Foley catheter for close monitoring of ins and outs.  appreciate nephrology input.  3. Postoperative acute blood loss anemia. Hemogram dropped from 11-7.8. With worsened renal function patient received 1 PRBC-05/20/2016 Recheck CBC in the evening.  4. Chronic diastolic congestive heart failure. Mild volume overload on clinical examination today. IV Fluid rate already reduced.  5. Essential hypertension Patient is on Cardizem 300 mg at home. Discontinue and change to low-dose Coreg, due to CHF  6. Coronary artery disease, status post CABG. Continue aspirin continue Lipitor.  7.Constipation. Bowel regimen changed to Senokot scheduled as  well as MiraLAX scheduled. Dulcolax suppository. If the patient does not have any BM today patient will need enema  8. Acute encephalopathy. Likely delirium. Due to increasing confusion and I would reduce the dose of Norco and discontinue morphine. Also discontinue Ambien. If there isn't improvement patient will need further workup.  9. Hypothermia. Inaccurate documentation.   Pain management: When necessary Norco, when necessary Robaxin Activity: SNF per physical therapy Bowel regimen: last BM 05/17/2016 Diet: Cardiac diet DVT Prophylaxis: subcutaneous Heparin  Advance goals of care discussion: Full code  Family Communication: family was present at bedside, at the time of interview. The pt provided permission to discuss medical plan with the family. Opportunity was given to ask question and all questions were answered satisfactorily.    Disposition:  Discharge to home health versus rehabilitation pending recovery, Expected discharge date: 05/24/2016, pending stabilization of CBC as well as renal function  Consultants: ORTHOPEDIC Procedures: Left intramedullary nail implantation 05/18/2016  Antibiotics: Anti-infectives    Start     Dose/Rate Route Frequency Ordered Stop   05/19/16 0000  ceFAZolin (ANCEF) IVPB 2g/100 mL premix     2 g 200 mL/hr over 30 Minutes Intravenous Every 6 hours 05/18/16 2015 05/19/16 0541   05/18/16 1653  ceFAZolin (ANCEF) 2-4 GM/100ML-% IVPB    Comments:  Shireen Quanodd, Robert   : cabinet override      05/18/16 1653 05/18/16 1710      Intake/Output Summary (Last 24 hours) at 05/21/16 1429 Last data filed at 05/21/16 1344  Gross per 24 hour  Intake   1000 ml  Output    950 ml  Net     50 ml   Filed Weights   05/19/16 0457 05/20/16 0359 05/21/16 0443  Weight: 86.637 kg (191 lb) 87.49 kg (192 lb 14.1 oz) 90.901 kg (200 lb  6.4 oz)   Objective: Physical Exam: Filed Vitals:   05/21/16 0754 05/21/16 0954 05/21/16 1051 05/21/16 1100  BP: 160/50  154/45  154/45  Pulse: 64  59 59  Temp: 93.3 F (34.1 C) 98.3 F (36.8 C) 98.4 F (36.9 C) 94.4 F (34.7 C)  TempSrc: Oral Oral Oral Oral  Resp: 18   18  Height:      Weight:      SpO2: 95%  95% 95%   General: Alert, Awake and Disoriented. Appear in mild distress Eyes: PERRL, Conjunctiva normal ENT: Oral Mucosa clear moist. Neck: NO JVD, NO Abnormal Mass Or lumps Cardiovascular: S1 and S2 Present, NO Murmur, Respiratory: Bilateral Air entry equal and Decreased, Clear to Auscultation, no Crackles, no wheezes Abdomen: Bowel Sound present, Soft and no tenderness Extremities: bilateral Pedal edema, no calf tenderness Neurologic: Grossly no focal neuro deficit. Bilaterally Equal motor strength  Data Reviewed: CBC:  Recent Labs Lab 05/18/16 1111 05/19/16 0453 05/20/16 0436 05/20/16 1254 05/21/16 0226  WBC 8.8 11.9* 12.7* 14.3* 10.3  NEUTROABS 7.4  --  10.0*  --  8.2*  HGB 11.0* 8.2* 7.8* 8.9* 8.1*  HCT 35.8* 26.3* 25.1* 28.1* 25.3*  MCV 86.1 86.8 86.9 88.6 88.5  PLT 137* 122* 110* 105* 117*   Basic Metabolic Panel:  Recent Labs Lab 05/18/16 1111 05/19/16 0453 05/20/16 0436 05/20/16 1254 05/21/16 0226  NA 140 140 139 139 140  K 3.6 4.5 4.3 4.5 4.4  CL 105 106 106 107 108  CO2 GLUCOSE 126* 144* 139* 136* 108*  BUN 17 24* 36* 41* 43*  CREATININE 1.12 1.62* 2.56* 2.53* 2.42*  CALCIUM 8.4* 7.9* 7.6* 7.7* 7.4*  MG  --   --   --   --  1.9  PHOS  --   --   --  4.7*  --     Liver Function Tests:  Recent Labs Lab 05/18/16 1111 05/20/16 0436 05/20/16 1254 05/21/16 0226  AST 13* 17  --  19  ALT 11* 6*  --  5*  ALKPHOS 61 48  --  47  BILITOT 0.7 0.4  --  0.3  PROT 5.1* 4.4*  --  4.2*  ALBUMIN 2.9* 2.4* 2.3* 2.0*   No results for input(s): LIPASE, AMYLASE in the last 168 hours. No results for input(s): AMMONIA in the last 168 hours. Coagulation Profile: No results for input(s): INR, PROTIME in the last 168 hours. Cardiac Enzymes:  Recent Labs Lab  05/20/16 1254  CKTOTAL 615*   BNP (last 3 results)  Recent Labs  09/11/15 1652  PROBNP 117.0*    CBG: No results for input(s): GLUCAP in the last 168 hours.  Studies: US Renal  05/20/2016  CLINICAL DATA:  82 year old hypertensive male with acute renal failure. Initial encounter. EXAM: RENAL / URINARY TRACT ULTRASOUND COMPLETE COMPARISON:  None. FINDINGS: Right Kidney: Length: 10.2 cm. Mild renal parenchymal thinning. Echogenic renal parenchyma. Findings may indicate changes of medical renal disease. No hydronephrosis or mass noted. Left Kidney: Length: 10.7 cm. 7.1 x 3 x 5.6 cm bilobed cyst mid to upper aspect of the left kidney. Mild renal parenchymal thinning. Echogenic renal parenchyma. Findings may indicate changes of medical renal disease. Bladder: Foley catheter in place and bladder decompressed with evaluation therefore limited. IMPRESSION: No hydronephrosis. Mild bilateral renal parenchymal thinning and increased parenchymal echogenicity. Findings may indicate changes of medical renal disease. Large bilobed left renal cyst measuring up to 7.1 cm. Electronically Signed  By: Lacy Duverney M.D.   On: 05/20/2016 15:46     Scheduled Meds: . allopurinol  300 mg Oral Daily  . atorvastatin  20 mg Oral q1800  . carvedilol  3.125 mg Oral BID WC  . dutasteride  0.5 mg Oral QHS  . ferrous sulfate  325 mg Oral Q breakfast  . heparin subcutaneous  5,000 Units Subcutaneous Q8H  . polyethylene glycol  17 g Oral BID  . senna-docusate  1 tablet Oral BID   Continuous Infusions: . sodium chloride 50 mL/hr at 05/21/16 0951   PRN Meds: acetaminophen **OR** acetaminophen, alum & mag hydroxide-simeth, bisacodyl, HYDROcodone-acetaminophen, methocarbamol **OR** methocarbamol (ROBAXIN)  IV, ondansetron **OR** ondansetron (ZOFRAN) IV  Time spent: 30 minutes  Author: Lynden Oxford, MD [facility] Pager: [phone] 05/21/2016 2:29 PM  If 7PM-7AM, please contact night-coverage at  [email or web address], password [facility]

## 2016-05-21 NOTE — Clinical Social Work Note (Signed)
CSW extended bed offers to patient. He accepted at [facility]. Wife and daughter were not at bedside. Patient stated that he would tell them.  Charlynn CourtSarah Tokiko Diefenderfer, CSW [phone]

## 2016-05-21 NOTE — Progress Notes (Signed)
Subjective: Interval History: has complaints still uncomfortable but better..  Objective: Vital signs in last 24 hours: Temp:  [93.3 F (34.1 C)-98.4 F (36.9 C)] 93.3 F (34.1 C) (05/30 0754) Pulse Rate:  [57-64] 64 (05/30 0754) Resp:  [18-20] 18 (05/30 0754) BP: (117-160)/(30-50) 160/50 mmHg (05/30 0754) SpO2:  [93 %-96 %] 95 % (05/30 0754) Weight:  [90.901 kg (200 lb 6.4 oz)] 90.901 kg (200 lb 6.4 oz) (05/30 0443) Weight change: 3.411 kg (7 lb 8.3 oz)  Intake/Output from previous day: 05/29 0701 - 05/30 0700 In: 1118 [P.O.:480; I.V.:300; Blood:338] Out: 450 [Urine:450] Intake/Output this shift:    General appearance: cooperative, pale and slowed mentation Resp: diminished breath sounds bilaterally and rales bibasilar Cardio: S1, S2 normal and systolic murmur: holosystolic 2/6, blowing at apex GI: mod distension, pos bs, mild lower abdm tender Extremities: L foot turned in, hip swelling,  2-2+ edema  Lab Results:  Recent Labs  05/20/16 1254 05/21/16 0226  WBC 14.3* 10.3  HGB 8.9* 8.1*  HCT 28.1* 25.3*  PLT 105* 117*   BMET:  Recent Labs  05/20/16 1254 05/21/16 0226  NA 139 140  K 4.5 4.4  CL 107 108  CO2 26 28  GLUCOSE 136* 108*  BUN 41* 43*  CREATININE 2.53* 2.42*  CALCIUM 7.7* 7.4*   No results for input(s): PTH in the last 72 hours. Iron Studies:  Recent Labs  05/20/16 1254  IRON 45  TIBC 183*    Studies/Results: [place] Renal  05/20/2016  CLINICAL DATA:  82 year old hypertensive male with acute renal failure. Initial encounter. EXAM: RENAL / URINARY TRACT ULTRASOUND COMPLETE COMPARISON:  None. FINDINGS: Right Kidney: Length: 10.2 cm. Mild renal parenchymal thinning. Echogenic renal parenchyma. Findings may indicate changes of medical renal disease. No hydronephrosis or mass noted. Left Kidney: Length: 10.7 cm. 7.1 x 3 x 5.6 cm bilobed cyst mid to upper aspect of the left kidney. Mild renal parenchymal thinning. Echogenic renal parenchyma. Findings may  indicate changes of medical renal disease. Bladder: Foley catheter in place and bladder decompressed with evaluation therefore limited. IMPRESSION: No hydronephrosis. Mild bilateral renal parenchymal thinning and increased parenchymal echogenicity. Findings may indicate changes of medical renal disease. Large bilobed left renal cyst measuring up to 7.1 cm. Electronically Signed   By: Lacy DuverneySteven  Olson M.D.   On: 05/20/2016 15:46    I have reviewed the patient's current medications.  Assessment/Plan: 1 AKI Cr stable to better. Pos vol. Low FENA c/w hemodyamic causes.  bp better off meds and with vol.  Will lower as does have edema.  Has evidence of CKD on U/S.  Protein studies pending. 2 CAD 3 COPD 4 Hip fx major blood loss 5 Anemia P lower ivf, cont ivf, proteinuria studies    LOS: 3 days   Deklen Popelka L 05/21/2016,8:50 AM

## 2016-05-21 NOTE — Progress Notes (Signed)
Occupational Therapy Treatment Patient Details Name: Dustin [name] MRN: [id number] DOB: 04/03/1926 Today's Date: 05/21/2016    History of present illness HPI: Dustin [name] is a 82 y.o. male with PMH of HTN, CAD h/o CABG, AAA s/p Stent, CHF, COPD, Pulmonary Fibrosis on Home oxygen presented after mechanical fall and hip pain s/p IM Nail L hip, TDWB   OT comments  Pt making slow progress. Pt reports minimal pain in L hip sitting EOB, but demonstrates heavy R lateral lean with minimal results with verbal/tactile/visual cues to lean L and bear weight on L hip. Encouraged left lateral neck flexion rotation and weight shifting to midline to work towards independent sitting. Pt continues to require total +2 assist at bed level for all ADLs. Will continue to follow acutely.  Follow Up Recommendations  SNF    Equipment Recommendations  Other (comment) (TBD in next venue)    Recommendations for Other Services      Precautions / Restrictions Precautions Precautions: Fall Restrictions Weight Bearing Restrictions: Yes LLE Weight Bearing: Touchdown weight bearing       Mobility Bed Mobility Overal bed mobility: Needs Assistance;+2 for physical assistance Bed Mobility: Supine to Sit;Sit to Supine;Rolling Rolling: Total assist;+2 for physical assistance   Supine to sit: Total assist;+2 for physical assistance Sit to supine: Total assist;+2 for physical assistance   General bed mobility comments: Assist to move bilateral LEs and for trunk support to come to sitting position. Pt required mod VCs for hand placement on bed rails and mattress for transfer and to maintain sitting balance. Pt able to minimally use RUE to pull up to sitting. Pt with R lateral lean in supine and sitting position - encouraged lateral neck flexion - left with minimal results.  Transfers                      Balance Overall balance assessment: Needs assistance Sitting-balance support: No upper extremity  supported;Feet supported Sitting balance-Leahy Scale: Zero Sitting balance - Comments: Overall mod assist to maintain safe upright position. Pt with strong R lateral lean for pain relief. Attempted to encouraged LUE with reaching for target, pushing away towards L, and providing midline target for pt to align with - minimal results for weight shifting onto L hip. Postural control: Right lateral lean                         ADL Overall ADL's : Needs assistance/impaired Eating/Feeding: Set up;Bed level Eating/Feeding Details (indicate cue type and reason): Pt reported having difficulty finding mouth to drink from straw and required cues Grooming: Wash/dry face;Set up;Bed level   Upper Body Bathing: Total assistance;Bed level Upper Body Bathing Details (indicate cue type and reason): +2 for rolling in bed Lower Body Bathing: Total assistance;+2 for physical assistance;Bed level Lower Body Bathing Details (indicate cue type and reason): +2 for rolling Upper Body Dressing : Total assistance;Bed level   Lower Body Dressing: Total assistance;+2 for physical assistance;Bed level               Functional mobility during ADLs: Total assistance;+2 for physical assistance        Vision                     Perception     Praxis      Cognition   Behavior During Therapy: Flat affect Overall Cognitive Status: Impaired/Different from baseline Area of Impairment: Attention;Following commands;Problem solving  Current Attention Level: Sustained    Following Commands: Follows one step commands inconsistently;Follows one step commands with increased time     Problem Solving: Slow processing;Decreased initiation;Difficulty sequencing;Requires verbal cues;Requires tactile cues      Extremity/Trunk Assessment               Exercises General Exercises - Upper Extremity Shoulder Flexion: AROM;Both;Supine Elbow Flexion: AROM;Both;10 reps;Supine Elbow Extension:  AROM;Both;10 reps;Supine Wrist Flexion: AROM;Both;10 reps;Supine Wrist Extension: AROM;Both;10 reps;Supine Digit Composite Flexion: AROM;Both;10 reps;Supine Composite Extension: AROM;Both;10 reps;Supine General Exercises - Lower Extremity Ankle Circles/Pumps: AROM;Both;10 reps Quad Sets: AROM;Left;5 reps Long Arc Quad: AAROM;Left;10 reps Heel Slides: AAROM;Both;Other reps (comment) Other Exercises Other Exercises: AROM, neck lateral flexion -left, 10 reps, supine   Shoulder Instructions       General Comments      Pertinent Vitals/ Pain       Pain Assessment: Faces Faces Pain Scale: Hurts even more Pain Location: L hip Pain Descriptors / Indicators: Grimacing;Guarding Pain Intervention(s): Limited activity within patient's tolerance;Monitored during session;Repositioned  Home Living                                          Prior Functioning/Environment              Frequency Min 2X/week     Progress Toward Goals  OT Goals(current goals can now be found in the care plan section)  Progress towards OT goals: Progressing toward goals  Acute Rehab OT Goals Patient Stated Goal: none stated OT Goal Formulation: With patient Time For Goal Achievement: 06/02/16 Potential to Achieve Goals: Good ADL Goals Pt Will Perform Upper Body Bathing: with min assist;sitting Pt Will Perform Lower Body Bathing: with min assist;sit to/from stand Pt Will Transfer to Toilet: with min assist;stand pivot transfer;bedside commode Pt Will Perform Toileting - Clothing Manipulation and hygiene: with min assist;sitting/lateral leans;sit to/from stand  Plan Discharge plan remains appropriate    Co-evaluation    PT/OT/SLP Co-Evaluation/Treatment: Yes Reason for Co-Treatment: For patient/therapist safety PT goals addressed during session: Mobility/safety with mobility;Balance;Strengthening/ROM OT goals addressed during session: Strengthening/ROM;ADL's and self-care       End of Session Equipment Utilized During Treatment: Oxygen   Activity Tolerance Patient tolerated treatment well   Patient Left in bed;with call bell/phone within reach;with bed alarm set   Nurse Communication Mobility status        Time: 1191-4782 OT Time Calculation (min): 44 min  Charges: OT General Charges $OT Visit: 1 Procedure OT Treatments $Self Care/Home Management : 23-37 mins  Nils Pyle, OTR/L Pager: [phone]  05/21/2016, 10:20 AM

## 2016-05-21 NOTE — Progress Notes (Signed)
Subjective: 3 Days Post-Op Procedure(s) (LRB): INTRAMEDULLARY (IM) NAIL INTERTROCHANTRIC (Left) Patient reports pain as mild.  Slow progress with physical therapy.  Objective: Vital signs in last 24 hours: Temp:  [93.3 F (34.1 C)-98.4 F (36.9 C)] 93.3 F (34.1 C) (05/30 0754) Pulse Rate:  [58-64] 64 (05/30 0754) Resp:  [18-20] 18 (05/30 0754) BP: (127-160)/(31-50) 160/50 mmHg (05/30 0754) SpO2:  [93 %-95 %] 95 % (05/30 0754) Weight:  [90.901 kg (200 lb 6.4 oz)] 90.901 kg (200 lb 6.4 oz) (05/30 0443)  Intake/Output from previous day: 05/29 0701 - 05/30 0700 In: 1118 [P.O.:480; I.V.:300; Blood:338] Out: 450 [Urine:450] Intake/Output this shift:     Recent Labs  05/18/16 1111 05/19/16 0453 05/20/16 0436 05/20/16 1254 05/21/16 0226  HGB 11.0* 8.2* 7.8* 8.9* 8.1*    Recent Labs  05/20/16 1254 05/21/16 0226  WBC 14.3* 10.3  RBC 3.17* 2.86*  HCT 28.1* 25.3*  PLT 105* 117*    Recent Labs  05/20/16 1254 05/21/16 0226  NA 139 140  K 4.5 4.4  CL 107 108  CO2 26 28  BUN 41* 43*  CREATININE 2.53* 2.42*  GLUCOSE 136* 108*  CALCIUM 7.7* 7.4*   No results for input(s): LABPT, INR in the last 72 hours. Left hip exam: Neurovascular intact Intact pulses distally Dorsiflexion/Plantar flexion intact Incision: dressing C/D/I Compartment softModerate left thigh swelling as would be expected.  Assessment/Plan: 3 Days Post-Op Procedure(s) (LRB): INTRAMEDULLARY (IM) NAIL INTERTROCHANTRIC (Left)  Plan: Touchdown weightbearing on left. Up with therapy Discharge to SNF when medically stable.When discharged from hospital would consider asa 325mg  BID x 1 month post op norco 5 mg prn pain.  will need follow-up with Dr. Luiz BlareGraves in 2 weeks   Melo Stauber G 05/21/2016, 9:49 AM

## 2016-05-21 NOTE — Clinical Social Work Note (Signed)
Clinical Social Work Assessment  Patient Details  Name: Dustin [name] MRN: [id number] Date of Birth: [birth year]  Date of referral:  05/18/16               Reason for consult:  Facility Placement, Discharge Planning                Permission sought to share information with:  Facility Sport and exercise psychologist, Family Supports Permission granted to share information::  Yes, Verbal Permission Granted  Name::     Adult nurse::  SNF's  Relationship::  Spouse  Contact Information:  [phone]  Housing/Transportation Living arrangements for the past 2 months:  [place] of Information:  Patient, Medical Team Patient Interpreter Needed:  None Criminal Activity/Legal Involvement Pertinent to Current Situation/Hospitalization:  No - Comment as needed Significant Relationships:  Spouse Lives with:  Spouse Do you feel safe going back to the place where you live?  Yes Need for family participation in patient care:  Yes (Comment)  Care giving concerns:  PT recommending SNF at discharge.   Social Worker assessment / plan:  CSW met with patient. No supports at bedside. CSW introduced role and explained that discharge planning would be discussed. Patient aware of recommendation for SNF and willing to go. First preference is [name] due to proximity to home. Patient gave verbal permission to fax information to other SNF's in the area as a backup. Patient will likely need PTAR. No further concerns. CSW encouraged patient to contact CSW as needed. CSW will continue to follow patient and facilitate discharge to SNF once medically stable.  Employment status:  Retired Nurse, adult PT Recommendations:  [place] / Referral to community resources:  [place]  Patient/Family's Response to care:  Patient agreeable to SNF placement. Patient's wife reportedly supportive and involved in patient's care. His daughter is  involved in care as well. Patient polite and appreciated social work intervention.  Patient/Family's Understanding of and Emotional Response to Diagnosis, Current Treatment, and Prognosis:  Patient knowledgeable of medical interventions and aware of discharge plan to SNF once medically stable.  Emotional Assessment Appearance:  Appears stated age Attitude/Demeanor/Rapport:   (Pleasant) Affect (typically observed):    Orientation:  Oriented to Self, Oriented to Place, Oriented to  Time, Oriented to Situation Alcohol / Substance use:  Never Used Psych involvement (Current and /or in the community):  No (Comment)  Discharge Needs  Concerns to be addressed:  Care Coordination Readmission within the last 30 days:  No Current discharge risk:  Dependent with Mobility Barriers to Discharge:  No Barriers Identified   Candie Chroman, LCSW 05/21/2016, 11:15 AM

## 2016-05-21 NOTE — Progress Notes (Signed)
Physical Therapy Treatment Patient Details Name: Dustin [name] MRN: [id number] DOB: [birth year] Today's Date: 05/21/2016    History of Present Illness HPI: JARETT DRALLE is a 82 y.o. male with PMH of HTN, CAD h/o CABG, AAA s/p Stent, CHF, COPD, Pulmonary Fibrosis on Home oxygen presented after mechanical fall and hip pain s/p IM Nail L hip, TDWB    PT Comments    Patient reports minimal pain in left hip (even after sitting EOB), however states he has fear of increased pain and stays weight-shifted onto his right hip (even in supine). Encouraged cervical rotation and lateral flexion to his left and worked EOB on weight shifting to midline to enable independent sitting (requires min to max assist at EOB).    Follow Up Recommendations  SNF     Equipment Recommendations  Rolling walker with 5" wheels;3in1 (PT) (perhaps drop-arm 3in1)    Recommendations for Other Services       Precautions / Restrictions Precautions Precautions: Fall Restrictions Weight Bearing Restrictions: Yes LLE Weight Bearing: Touchdown weight bearing    Mobility  Bed Mobility Overal bed mobility: Needs Assistance;+2 for physical assistance Bed Mobility: Supine to Sit;Sit to Supine;Rolling Rolling: Total assist;+2 for physical assistance (Rt and Lt)   Supine to sit: Total assist;+2 for physical assistance;HOB elevated Sit to supine: Total assist;+2 for physical assistance;+2 for safety/equipment   General bed mobility comments: Assist to move bilateral LEs and for trunk support to come to sitting position. Pt minimally able to use RUE to assit to raise torso. Pt "freezes" when assisting to supine requiring total asssit  Transfers                    Ambulation/Gait                 Stairs            Wheelchair Mobility    Modified Rankin (Stroke Patients Only)       Balance Overall balance assessment: Needs assistance;History of Falls Sitting-balance support: Single  extremity supported;Feet supported Sitting balance-Leahy Scale: Zero Sitting balance - Comments: Mod assist to maintain safe upright position. Pt with strong lean to his right to offload lt hip. Worked on reaching with LUE towards lt with target, pushing PT away towards his left, providing midline target for pt to align with---all with minimal results in shifting weght onto left hip Postural control: Right lateral lean                          Cognition Arousal/Alertness: Awake/alert Behavior During Therapy: Flat affect Overall Cognitive Status: Within Functional Limits for tasks assessed (minimal verbal output during session)                      Exercises General Exercises - Lower Extremity Ankle Circles/Pumps: AROM;Both;10 reps Quad Sets: AROM;Left;5 reps Long Arc Quad: AAROM;Left;10 reps Heel Slides: AAROM;Both;Other reps (comment)    General Comments General comments (skin integrity, edema, etc.): Even in supine pt leans to his right with head laterally flexed and rotated to the right      Pertinent Vitals/Pain Pain Assessment: Faces Faces Pain Scale: Hurts even more Pain Location: Lt hip Pain Descriptors / Indicators: Guarding;Grimacing Pain Intervention(s): Limited activity within patient's tolerance;Monitored during session;Repositioned    Home Living                      Prior Function  PT Goals (current goals can now be found in the care plan section) Acute Rehab PT Goals Patient Stated Goal: none stated Time For Goal Achievement: 06/02/16 Progress towards PT goals: Progressing toward goals (more alert, participative)    Frequency  Min 3X/week    PT Plan Current plan remains appropriate    Co-evaluation PT/OT/SLP Co-Evaluation/Treatment: Yes Reason for Co-Treatment: For patient/therapist safety PT goals addressed during session: Mobility/safety with mobility;Balance;Strengthening/ROM       End of Session Equipment  Utilized During Treatment:  (bed pad) Activity Tolerance: Patient limited by pain (and fear of pain) Patient left: with call bell/phone within reach;in bed;with bed alarm set     Time: [id number] PT Time Calculation (min) (ACUTE ONLY): 43 min  Charges:  $Therapeutic Exercise: 8-22 mins                    G Codes:      Kyuss Hale 05/21/2016, 9:46 AM Pager [phone]

## 2016-05-21 NOTE — Clinical Social Work Placement (Signed)
   CLINICAL SOCIAL WORK PLACEMENT  NOTE  Date:  05/21/2016  Patient Details  Name: Dustin [name] MRN: [id number] Date of Birth: 01/19/1926  Clinical Social Work is seeking post-discharge placement for this patient at the Skilled  Nursing Facility level of care (*CSW will initial, date and re-position this form in  chart as items are completed):  Yes   Patient/family provided with [facility] Clinical Social Work Department's list of facilities offering this level of care within the geographic area requested by the patient (or if unable, by the patient's family).  Yes   Patient/family informed of their freedom to choose among providers that offer the needed level of care, that participate in Medicare, Medicaid or managed care program needed by the patient, have an available bed and are willing to accept the patient.  Yes   Patient/family informed of [facility]'s ownership interest in [facility] and [facility], as well as of the fact that they are under no obligation to receive care at these facilities.  PASRR submitted to EDS on 05/21/16     PASRR number received on 05/21/16     Existing PASRR number confirmed on       FL2 transmitted to all facilities in geographic area requested by pt/family on       FL2 transmitted to all facilities within larger geographic area on       Patient informed that his/her managed care company has contracts with or will negotiate with certain facilities, including the following:            Patient/family informed of bed offers received.  Patient chooses bed at       Physician recommends and patient chooses bed at      Patient to be transferred to   on  .  Patient to be transferred to facility by       Patient family notified on   of transfer.  Name of family member notified:        PHYSICIAN       Additional Comment:    _______________________________________________ Margarito LinerSarah C Britaney Espaillat, LCSW 05/21/2016, 11:23 AM

## 2016-05-21 NOTE — Care Management Important Message (Signed)
Important Message  Patient Details  Name: Dustin LawmanGeorge R Offenberger MRN: [id number] Date of Birth: 09/04/1926   Medicare Important Message Given:  Yes    Leiby Pigeon, Stephan MinisterSusan Coleman 05/21/2016, 7:58 AM

## 2016-05-21 NOTE — NC FL2 (Signed)
[place] MEDICAID FL2 LEVEL OF CARE SCREENING TOOL     IDENTIFICATION  Patient Name: Dustin [name] Birthdate: 03/14/1926 Sex: male Admission Date (Current Location): 05/18/2016  [facility] [place]   Facility and Address:  [facility], [address], [place], [place] [address]      Provider Number: [id number]  Attending Physician Name and Address:  Rolly SalterPranav M Patel, MD  Relative Name and Phone Number:       Current Level of Care: Hospital Recommended Level of Care: Skilled Nursing Facility Prior Approval Number:    Date Approved/Denied:   PASRR Number: [id number] [id number]  Discharge Plan: SNF    Current Diagnoses: Patient Active Problem List   Diagnosis Date Noted  . Postoperative anemia due to acute blood loss 05/19/2016  . AKI (acute kidney injury) (HCC) 05/19/2016  . Hip fracture, left (HCC) 05/18/2016  . Hip fracture (HCC) 05/18/2016  . Diastolic congestive heart failure (HCC) 09/13/2015  . Renal cyst 09/13/2015  . Thyroid nodule 09/13/2015  . Emphysema of lung (HCC) 09/13/2015  . Pulmonary nodule 09/13/2015  . S/P CABG (coronary artery bypass graft) 08/10/2014  . Proteinuria 08/10/2014  . Melanoma in situ (HCC) 01/07/2013  . Edema 08/10/2012  . Dysuria 01/25/2011  . HEART DISEASE 07/18/2010  . BENIGN PROSTATIC HYPERTROPHY, HX OF 07/18/2010  . CHICKENPOX, HX OF 07/18/2010  . HYPERLIPIDEMIA 07/13/2010  . Essential hypertension 07/13/2010  . ABDOMINAL AORTIC ANEURYSM 07/13/2010    Orientation RESPIRATION BLADDER Height & Weight     Self, Time, Situation, Place  O2 (Nasal Canula 3 L) Continent, Indwelling catheter Weight: 200 lb 6.4 oz (90.901 kg) Height:  5\' 7"  (170.2 cm)  BEHAVIORAL SYMPTOMS/MOOD NEUROLOGICAL BOWEL NUTRITION STATUS   (None)  (None) Continent Diet (Regular)  AMBULATORY STATUS COMMUNICATION OF NEEDS Skin   Total Care Verbally Surgical wounds (Left hip (Open/Dehisced incision) and left leg (Closed  incision))                       Personal Care Assistance Level of Assistance  Bathing, Feeding, Dressing Bathing Assistance: Maximum assistance Feeding assistance: Limited assistance Dressing Assistance: Maximum assistance     Functional Limitations Info  Sight, Hearing, Speech Sight Info: Adequate Hearing Info: Adequate Speech Info: Adequate    SPECIAL CARE FACTORS FREQUENCY  PT (By licensed PT), OT (By licensed OT)     PT Frequency: 5 x week OT Frequency: 5 x week            Contractures Contractures Info: Not present    Additional Factors Info  Code Status, Allergies Code Status Info: Full Allergies Info: NKDA           Current Medications (05/21/2016):  This is the current hospital active medication list Current Facility-Administered Medications  Medication Dose Route Frequency Provider Last Rate Last Dose  . 0.9 %  sodium chloride infusion   Intravenous Continuous Beryle LatheJames Deterding, MD 50 mL/hr at 05/21/16 [phone]    . acetaminophen (TYLENOL) tablet 650 mg  650 mg Oral Q6H PRN Marshia LyJames Bethune, PA-C       Or  . acetaminophen (TYLENOL) suppository 650 mg  650 mg Rectal Q6H PRN Marshia LyJames Bethune, PA-C      . allopurinol (ZYLOPRIM) tablet 300 mg  300 mg Oral Daily Esperanza SheetsUlugbek N Buriev, MD   300 mg at 05/21/16 0951  . alum & mag hydroxide-simeth (MAALOX/MYLANTA) 200-200-20 MG/5ML suspension 30 mL  30 mL Oral Q4H PRN Marshia LyJames Bethune,  PA-C   30 mL at 05/20/16 1654  . atorvastatin (LIPITOR) tablet 20 mg  20 mg Oral q1800 Esperanza Sheets, MD   20 mg at 05/20/16 1655  . bisacodyl (DULCOLAX) EC tablet 5 mg  5 mg Oral Daily PRN Marshia Ly, PA-C      . carvedilol (COREG) tablet 3.125 mg  3.125 mg Oral BID WC Rolly Salter, MD   3.125 mg at 05/21/16 0610  . dutasteride (AVODART) capsule 0.5 mg  0.5 mg Oral QHS Esperanza Sheets, MD   0.5 mg at 05/20/16 2149  . ferrous sulfate tablet 325 mg  325 mg Oral Q breakfast Marshia Ly, PA-C   325 mg at 05/21/16 0610  . heparin injection  5,000 Units  5,000 Units Subcutaneous Q8H Rolly Salter, MD   5,000 Units at 05/21/16 0610  . HYDROcodone-acetaminophen (NORCO/VICODIN) 5-325 MG per tablet 1-2 tablet  1-2 tablet Oral Q6H PRN Esperanza Sheets, MD   2 tablet at 05/21/16 0950  . methocarbamol (ROBAXIN) tablet 500 mg  500 mg Oral Q6H PRN Marshia Ly, PA-C   500 mg at 05/20/16 1503   Or  . methocarbamol (ROBAXIN) 500 mg in dextrose 5 % 50 mL IVPB  500 mg Intravenous Q6H PRN Marshia Ly, PA-C      . morphine 2 MG/ML injection 2 mg  2 mg Intravenous Q2H PRN Esperanza Sheets, MD   2 mg at 05/20/16 0314  . ondansetron (ZOFRAN) tablet 4 mg  4 mg Oral Q6H PRN Marshia Ly, PA-C       Or  . ondansetron [facility]) injection 4 mg  4 mg Intravenous Q6H PRN Marshia Ly, PA-C      . polyethylene glycol (MIRALAX / GLYCOLAX) packet 17 g  17 g Oral BID Rolly Salter, MD   17 g at 05/21/16 0951  . senna-docusate (Senokot-S) tablet 1 tablet  1 tablet Oral BID Rolly Salter, MD   1 tablet at 05/21/16 1610  . zolpidem (AMBIEN) tablet 5 mg  5 mg Oral QHS PRN Marshia Ly, PA-C         Discharge Medications: Please see discharge summary for a list of discharge medications.  Relevant Imaging Results:  Relevant Lab Results:   Additional Information SS#: [id number]  Margarito Liner, LCSW

## 2016-05-21 NOTE — Op Note (Signed)
NAME:  [name], Hemi               ACCOUNT NO.:  [id number]  MEDICAL RECORD NO.:  [id number]  LOCATION:                                 FACILITY:  PHYSICIAN:  Harvie JuniorJohn L. Armiyah Capron, M.D.   DATE OF BIRTH:  22-Dec-1926  DATE OF PROCEDURE:  05/18/2016 DATE OF DISCHARGE:                              OPERATIVE REPORT   PREOPERATIVE DIAGNOSIS:  Intertrochanteric hip fracture displaced, left.  POSTOPERATIVE DIAGNOSIS:  Intertrochanteric hip fracture displaced, left.  PROCEDURE: 1. Open reduction and internal fixation of left intertrochanteric hip     fracture with an intramedullary rod with locking into the femoral     head and also freehand distal interlock. 2. Interpretation of multiple intraoperative fluoroscopic images.  BRIEF HISTORY:  Mr. Dustin [name] is an 82 year old male, who fell suffering a left intertrochanteric hip fracture.  We were consulted for management. X-ray showed displaced left intertrochanteric hip fracture.  We talked to he and the family about treatment options.  We felt that open reduction and internal fixation was most appropriate course of action. He was brought to the operating room for this procedure.  DESCRIPTION OF PROCEDURE:  The patient was brought to the operating room.  After adequate anesthesia was obtained with general anesthetic, the patient was placed onto the fracture table and manipulative closed reduction was undertaken and placement in traction.  Once this was done, attention was turned to the left hip where routine prep and drape was used.  Following this, a small incision made just proximal to the trochanter, subcutaneous tissue down to the level of trochanter.  A guidewire was placed into the central portion of the femur on both AP and lateral fluoro.  This was over-reamed with an introductory reamer and then the 13 reamer was taken down, 11 mm rod was opened and placed, compression screw was placed into the head through the rod and locked, and then  attention turned distally where a freehand interlock was taken. Multiple intraoperative fluoroscopic images were taken to ensure adequacy of reduction and screw length and placement.  Once this was done, the wounds were irrigated, suctioned dry, closed in layers. Sterile compressive dressing was applied, and the patient taken to recovery room, was noted to be in satisfactory condition.  Estimated blood loss for procedure was 250 mL.     Harvie JuniorJohn L. Fabian Walder, M.D.   ______________________________ Harvie JuniorJohn L. Ula Couvillon, M.D.   Ranae PlumberJLG/MEDQ  D:  05/20/2016  T:  05/20/2016  Job:  [id number]

## 2016-05-22 DIAGNOSIS — N179 Acute kidney failure, unspecified: Secondary | ICD-10-CM

## 2016-05-22 DIAGNOSIS — I5033 Acute on chronic diastolic (congestive) heart failure: Secondary | ICD-10-CM

## 2016-05-22 LAB — PROTEIN ELECTROPHORESIS, SERUM
A/G Ratio: 0.9 (ref 0.7–1.7)
ALPHA-2-GLOBULIN: 0.4 g/dL (ref 0.4–1.0)
Albumin ELP: 2.2 g/dL — ABNORMAL LOW (ref 2.9–4.4)
Alpha-1-Globulin: 0.5 g/dL — ABNORMAL HIGH (ref 0.0–0.4)
BETA GLOBULIN: 1.1 g/dL (ref 0.7–1.3)
GAMMA GLOBULIN: 0.5 g/dL (ref 0.4–1.8)
Globulin, Total: 2.4 g/dL (ref 2.2–3.9)
M-Spike, %: 0.4 g/dL — ABNORMAL HIGH
Total Protein ELP: 4.6 g/dL — ABNORMAL LOW (ref 6.0–8.5)

## 2016-05-22 LAB — UIFE/LIGHT CHAINS/TP QN, 24-HR UR
% BETA, Urine: 13.1 %
ALPHA 1 URINE: 3 %
Albumin, U: 62.6 %
Alpha 2, Urine: 9.7 %
Free Kappa/Lambda Ratio: 13.53 — ABNORMAL HIGH (ref 2.04–10.37)
Free Lambda Lt Chains,Ur: 20.4 mg/L — ABNORMAL HIGH (ref 0.24–6.66)
Free Lt Chn Excr Rate: 276 mg/L — ABNORMAL HIGH (ref 1.35–24.19)
GAMMA GLOBULIN URINE: 11.5 %
TOTAL PROTEIN, URINE-UPE24: 104.1 mg/dL
Time: 24 hours
Total Protein, Urine-Ur/day: 885 mg/24 hr — ABNORMAL HIGH (ref 30–150)
VOLUME, URINE-UPE24: 850 mL

## 2016-05-22 LAB — CBC
HEMATOCRIT: 27.8 % — AB (ref 39.0–52.0)
Hemoglobin: 8.7 g/dL — ABNORMAL LOW (ref 13.0–17.0)
MCH: 27.7 pg (ref 26.0–34.0)
MCHC: 31.3 g/dL (ref 30.0–36.0)
MCV: 88.5 fL (ref 78.0–100.0)
Platelets: 154 10*3/uL (ref 150–400)
RBC: 3.14 MIL/uL — AB (ref 4.22–5.81)
RDW: 15.6 % — ABNORMAL HIGH (ref 11.5–15.5)
WBC: 11.2 10*3/uL — AB (ref 4.0–10.5)

## 2016-05-22 LAB — RENAL FUNCTION PANEL
ALBUMIN: 2 g/dL — AB (ref 3.5–5.0)
Anion gap: 8 (ref 5–15)
BUN: 41 mg/dL — ABNORMAL HIGH (ref 6–20)
CHLORIDE: 111 mmol/L (ref 101–111)
CO2: 23 mmol/L (ref 22–32)
CREATININE: 1.7 mg/dL — AB (ref 0.61–1.24)
Calcium: 8 mg/dL — ABNORMAL LOW (ref 8.9–10.3)
GFR, EST AFRICAN AMERICAN: 40 mL/min — AB (ref >60)
GFR, EST NON AFRICAN AMERICAN: 34 mL/min — AB (ref >60)
Glucose, Bld: 109 mg/dL — ABNORMAL HIGH (ref 65–99)
PHOSPHORUS: 3 mg/dL (ref 2.5–4.6)
POTASSIUM: 4.6 mmol/L (ref 3.5–5.1)
Sodium: 142 mmol/L (ref 135–145)

## 2016-05-22 LAB — PROTIME-INR
INR: 1.15 (ref 0.00–1.49)
PROTHROMBIN TIME: 14.9 s (ref 11.6–15.2)

## 2016-05-22 MED ORDER — FUROSEMIDE 40 MG PO TABS
40.0000 mg | ORAL_TABLET | Freq: Two times a day (BID) | ORAL | Status: DC
  Administered 2016-05-22 – 2016-05-23 (×3): 40 mg via ORAL
  Filled 2016-05-22 (×3): qty 1

## 2016-05-22 NOTE — Progress Notes (Addendum)
PROGRESS NOTE  Dustin [name]:[id number] DOB: 12-18-1926 DOA: 05/18/2016 PCP: Crawford Givens, MD   LOS: 4 days   Brief Narrative: Patient was admitted on 05/18/2016, with complaint of mechanical fall, was found to have left hip fracture. Patient underwent intramedullary nail procedure 05/18/2016. Patient also had complaints of progressively worsening leg swelling for last few months. Doppler was negative for DVT and echocardiogram showed diastolic dysfunction with preserved EF. Renal function as well as hemoglobin worsened after the surgery and nephrology was consulted  Assessment & Plan: Principal Problem:   Hip fracture, left (HCC) Active Problems:   Essential hypertension   S/P CABG (coronary artery bypass graft)   Diastolic congestive heart failure (HCC)   Emphysema of lung (HCC)   Postoperative anemia due to acute blood loss   AKI (acute kidney injury) (HCC)   Hip fracture, left (HCC) - Status post intramedullary nail implant 05/18/2016. - PTOT consult recommends SNF. Social worker consulted.  Acute kidney injury. - Postoperative, possible hypotension peri procedurally - nephrology following, starting lasix today   Postoperative acute blood loss anemia. - Hemogram dropped from 11-7.8 >> 8.7 today and stable. Monitor. - received 1u pRBC 5/29   Acute on chronic diastolic congestive heart failure. - volume overloaded, start lasix IV  Essential hypertension - Patient is on Cardizem 300 mg at home. - Discontinue and change to low-dose Coreg, due to CHF  Coronary artery disease, status post CABG. - Continue aspirin continue Lipitor.  Acute encephalopathy. - Likely delirium. - minimize narcotics.     DVT prophylaxis: heparin Code Status: Full Family Communication: no family bedside Disposition Plan: SNF when ready  Consultants:   Nephrology  Orthopedic surgery   Procedures:   2D echo:  Study Conclusions - Left ventricle: The cavity size was normal. Wall  thickness was increased in a pattern of moderate LVH. Systolic function was normal. The estimated ejection fraction was in the range of 55% to 60%. Doppler parameters are consistent with abnormal left ventricular relaxation (grade 1 diastolic dysfunction).  Antimicrobials:  None    Subjective: - confused. Upset as claims nobody told him the plan. Doesn't know why he is in the hospital. No pain.  Objective: Filed Vitals:   05/21/16 1455 05/21/16 1615 05/21/16 2100 05/22/16 0458  BP: 143/44 153/62 162/55 169/67  Pulse: 63 67 64 74  Temp:  98.1 F (36.7 C) 98.4 F (36.9 C) 98.4 F (36.9 C)  TempSrc:  Oral Oral Oral  Resp:  18 18 18   Height:      Weight:    89.903 kg (198 lb 3.2 oz)  SpO2:  93% 95% 98%    Intake/Output Summary (Last 24 hours) at 05/22/16 1209 Last data filed at 05/22/16 1000  Gross per 24 hour  Intake 1620.83 ml  Output   1621 ml  Net  -0.17 ml   Filed Weights   05/20/16 0359 05/21/16 0443 05/22/16 0458  Weight: 87.49 kg (192 lb 14.1 oz) 90.901 kg (200 lb 6.4 oz) 89.903 kg (198 lb 3.2 oz)    Examination: Constitutional: NAD Filed Vitals:   05/21/16 1455 05/21/16 1615 05/21/16 2100 05/22/16 0458  BP: 143/44 153/62 162/55 169/67  Pulse: 63 67 64 74  Temp:  98.1 F (36.7 C) 98.4 F (36.9 C) 98.4 F (36.9 C)  TempSrc:  Oral Oral Oral  Resp:  18 18 18   Height:      Weight:    89.903 kg (198 lb 3.2 oz)  SpO2:  93% 95% 98%  Eyes: PERRL, lids and conjunctivae normal ENMT: Mucous membranes are moist Respiratory: decreased breath sounds, no wheezing, no crackles. Normal respiratory effort. No accessory muscle use.  Cardiovascular: Regular rate and rhythm, SEM. 2+ pitting LE edema. Abdomen: no tenderness. Bowel sounds positive.  Neurologic: non focal   Data Reviewed: I have personally reviewed following labs and imaging studies  CBC:  Recent Labs Lab 05/18/16 1111 05/19/16 0453 05/20/16 0436 05/20/16 1254 05/21/16 0226 05/22/16 0609  WBC  8.8 11.9* 12.7* 14.3* 10.3 11.2*  NEUTROABS 7.4  --  10.0*  --  8.2*  --   HGB 11.0* 8.2* 7.8* 8.9* 8.1* 8.7*  HCT 35.8* 26.3* 25.1* 28.1* 25.3* 27.8*  MCV 86.1 86.8 86.9 88.6 88.5 88.5  PLT 137* 122* 110* 105* 117* 154   Basic Metabolic Panel:  Recent Labs Lab 05/19/16 0453 05/20/16 0436 05/20/16 1254 05/21/16 0226 05/22/16 0609  NA 140 139 139 140 142  K 4.5 4.3 4.5 4.4 4.6  CL 106 106 107 108 111  CO2 GLUCOSE 144* 139* 136* 108* 109*  BUN 24* 36* 41* 43* 41*  CREATININE 1.62* 2.56* 2.53* 2.42* 1.70*  CALCIUM 7.9* 7.6* 7.7* 7.4* 8.0*  MG  --   --   --  1.9  --   PHOS  --   --  4.7*  --  3.0   GFR: Estimated Creatinine Clearance: 32.7 mL/min (by C-G formula based on Cr of 1.7). Liver Function Tests:  Recent Labs Lab 05/18/16 1111 05/20/16 0436 05/20/16 1254 05/21/16 0226 05/22/16 0609  AST 13* 17  --  19  --   ALT 11* 6*  --  5*  --   ALKPHOS 61 48  --  47  --   BILITOT 0.7 0.4  --  0.3  --   PROT 5.1* 4.4*  --  4.2*  --   ALBUMIN 2.9* 2.4* 2.3* 2.0* 2.0*   No results for input(s): LIPASE, AMYLASE in the last 168 hours. No results for input(s): AMMONIA in the last 168 hours. Coagulation Profile:  Recent Labs Lab 05/22/16 0609  INR 1.15   Cardiac Enzymes:  Recent Labs Lab 05/20/16 1254  CKTOTAL 615*   BNP (last 3 results)  Recent Labs  09/11/15 1652  PROBNP 117.0*   HbA1C: No results for input(s): HGBA1C in the last 72 hours. CBG: No results for input(s): GLUCAP in the last 168 hours. Lipid Profile: No results for input(s): CHOL, HDL, LDLCALC, TRIG, CHOLHDL, LDLDIRECT in the last 72 hours. Thyroid Function Tests: No results for input(s): TSH, T4TOTAL, FREET4, T3FREE, THYROIDAB in the last 72 hours. Anemia Panel:  Recent Labs  05/20/16 1254  TIBC 183*  IRON 45   Urine analysis:    Component Value Date/Time   COLORURINE YELLOW 05/20/2016 1342   APPEARANCEUR CLOUDY* 05/20/2016 1342   LABSPEC 1.025 05/20/2016 1342    PHURINE 5.0 05/20/2016 1342   GLUCOSEU NEGATIVE 05/20/2016 1342   HGBUR SMALL* 05/20/2016 1342   HGBUR large 01/25/2011 0930   BILIRUBINUR NEGATIVE 05/20/2016 1342   BILIRUBINUR neg 08/09/2014 1455   KETONESUR 15* 05/20/2016 1342   PROTEINUR 100* 05/20/2016 1342   PROTEINUR 1+ 08/09/2014 1455   UROBILINOGEN 0.2 08/09/2014 1455   UROBILINOGEN 0.2 01/25/2011 0930   NITRITE NEGATIVE 05/20/2016 1342   NITRITE Neg 08/09/2014 1455   LEUKOCYTESUR NEGATIVE 05/20/2016 1342   Sepsis Labs: Invalid input(s): PROCALCITONIN, LACTICIDVEN  No results found for this or any previous visit (from the past 240  hour(s)).    Radiology Studies: [place]  05/20/2016  CLINICAL DATA:  82 year old hypertensive male with acute renal failure. Initial encounter. EXAM: RENAL / URINARY TRACT ULTRASOUND COMPLETE COMPARISON:  None. FINDINGS: Right Kidney: Length: 10.2 cm. Mild renal parenchymal thinning. Echogenic renal parenchyma. Findings may indicate changes of medical renal disease. No hydronephrosis or mass noted. Left Kidney: Length: 10.7 cm. 7.1 x 3 x 5.6 cm bilobed cyst mid to upper aspect of the left kidney. Mild renal parenchymal thinning. Echogenic renal parenchyma. Findings may indicate changes of medical renal disease. Bladder: Foley catheter in place and bladder decompressed with evaluation therefore limited. IMPRESSION: No hydronephrosis. Mild bilateral renal parenchymal thinning and increased parenchymal echogenicity. Findings may indicate changes of medical renal disease. Large bilobed left renal cyst measuring up to 7.1 cm. Electronically Signed   By: Lacy DuverneySteven  Olson M.D.   On: 05/20/2016 15:46     Scheduled Meds: . allopurinol  300 mg Oral Daily  . atorvastatin  20 mg Oral q1800  . carvedilol  3.125 mg Oral BID WC  . dutasteride  0.5 mg Oral QHS  . ferrous sulfate  325 mg Oral Q breakfast  . furosemide  40 mg Oral BID  . heparin subcutaneous  5,000 Units Subcutaneous Q8H  . polyethylene glycol  17 g  Oral BID  . senna-docusate  1 tablet Oral BID   Continuous Infusions:    Pamella Pertostin Gherghe, MD, PhD [facility] Pager [phone]  If 7PM-7AM, please contact night-coverage [email or web address] Password [email or web address] 05/22/2016, 12:09 PM

## 2016-05-22 NOTE — Clinical Social Work Note (Addendum)
CSW faxed clinicals this morning for insurance authorization.  Charlynn CourtSarah Anberlin Diez, CSW [phone]  4:06 pm Authorization obtained and post-dated for 6/1. Good until 6/3. Auth number: [id number]. [employer] notified.  Charlynn CourtSarah Garcia Dalzell, CSW [phone]

## 2016-05-22 NOTE — Progress Notes (Signed)
Subjective: Interval History: has complaints confused.  Objective: Vital signs in last 24 hours: Temp:  [94.4 F (34.7 C)-98.8 F (37.1 C)] 98.4 F (36.9 C) (05/31 0458) Pulse Rate:  [59-74] 74 (05/31 0458) Resp:  [18] 18 (05/31 0458) BP: (143-169)/(44-67) 169/67 mmHg (05/31 0458) SpO2:  [93 %-98 %] 98 % (05/31 0458) Weight:  [89.903 kg (198 lb 3.2 oz)] 89.903 kg (198 lb 3.2 oz) (05/31 0458) Weight change: -0.998 kg (-2 lb 3.2 oz)  Intake/Output from previous day: 05/30 0701 - 05/31 0700 In: 2891.7 [P.O.:700; I.V.:2191.7] Out: 1320 [Urine:1320] Intake/Output this shift:    General appearance: cooperative, mildly obese and pale Resp: diminished breath sounds bilaterally and rales bibasilar Cardio: S1, S2 normal and systolic murmur: holosystolic 2/6, blowing at apex GI: obese, pos bs,  Extremities: edema 2-3+ and much swelling L hip, foot turned in  Lab Results:  Recent Labs  05/21/16 0226 05/22/16 0609  WBC 10.3 11.2*  HGB 8.1* 8.7*  HCT 25.3* 27.8*  PLT 117* 154   BMET:  Recent Labs  05/21/16 0226 05/22/16 0609  NA 140 142  K 4.4 4.6  CL 108 111  CO2 28 23  GLUCOSE 108* 109*  BUN 43* 41*  CREATININE 2.42* 1.70*  CALCIUM 7.4* 8.0*   No results for input(s): PTH in the last 72 hours. Iron Studies:  Recent Labs  05/20/16 1254  IRON 45  TIBC 183*    Studies/Results: [place] [facility]al  05/20/2016  CLINICAL DATA:  82 year old hypertensive male with acute renal failure. Initial encounter. EXAM: RENAL / URINARY TRACT ULTRASOUND COMPLETE COMPARISON:  None. FINDINGS: Right Kidney: Length: 10.2 cm. Mild renal parenchymal thinning. Echogenic renal parenchyma. Findings may indicate changes of medical renal disease. No hydronephrosis or mass noted. Left Kidney: Length: 10.7 cm. 7.1 x 3 x 5.6 cm bilobed cyst mid to upper aspect of the left kidney. Mild renal parenchymal thinning. Echogenic renal parenchyma. Findings may indicate changes of medical renal disease. Bladder:  Foley catheter in place and bladder decompressed with evaluation therefore limited. IMPRESSION: No hydronephrosis. Mild bilateral renal parenchymal thinning and increased parenchymal echogenicity. Findings may indicate changes of medical renal disease. Large bilobed left renal cyst measuring up to 7.1 cm. Electronically Signed   By: Lacy DuverneySteven  Olson M.D.   On: 05/20/2016 15:46    I have reviewed the patient's current medications.  Assessment/Plan: 1 AKI improving, good urine vol. bp adequate for renal perfusion.  Vol xs will add diuretic.  Cause was low bp at op and large blood loss.  UPEP/SPEP pending. 2 Anemia blood loss 3 Hip fx per Ortho 4 CAD 5 COPD P restart Lasix.  Stop ivf.      LOS: 4 days   Naba Sneed L 05/22/2016,9:24 AM

## 2016-05-23 DIAGNOSIS — S72002A Fracture of unspecified part of neck of left femur, initial encounter for closed fracture: Secondary | ICD-10-CM

## 2016-05-23 LAB — CBC
HCT: 25.5 % — ABNORMAL LOW (ref 39.0–52.0)
Hemoglobin: 8.1 g/dL — ABNORMAL LOW (ref 13.0–17.0)
MCH: 27.8 pg (ref 26.0–34.0)
MCHC: 31.8 g/dL (ref 30.0–36.0)
MCV: 87.6 fL (ref 78.0–100.0)
PLATELETS: 174 10*3/uL (ref 150–400)
RBC: 2.91 MIL/uL — AB (ref 4.22–5.81)
RDW: 15.6 % — AB (ref 11.5–15.5)
WBC: 9.8 10*3/uL (ref 4.0–10.5)

## 2016-05-23 LAB — RENAL FUNCTION PANEL
ALBUMIN: 1.8 g/dL — AB (ref 3.5–5.0)
Anion gap: 7 (ref 5–15)
BUN: 44 mg/dL — AB (ref 6–20)
CALCIUM: 8.1 mg/dL — AB (ref 8.9–10.3)
CHLORIDE: 105 mmol/L (ref 101–111)
CO2: 28 mmol/L (ref 22–32)
CREATININE: 1.66 mg/dL — AB (ref 0.61–1.24)
GFR, EST AFRICAN AMERICAN: 41 mL/min — AB (ref >60)
GFR, EST NON AFRICAN AMERICAN: 35 mL/min — AB (ref >60)
Glucose, Bld: 100 mg/dL — ABNORMAL HIGH (ref 65–99)
PHOSPHORUS: 4.2 mg/dL (ref 2.5–4.6)
Potassium: 4.1 mmol/L (ref 3.5–5.1)
Sodium: 140 mmol/L (ref 135–145)

## 2016-05-23 MED ORDER — HYDROCODONE-ACETAMINOPHEN 5-325 MG PO TABS: 1.0000 | ORAL_TABLET | Freq: Four times a day (QID) | ORAL | Status: DC | PRN

## 2016-05-23 MED ORDER — ASCRIPTIN 325 MG PO TABS: 325.0000 mg | ORAL_TABLET | Freq: Two times a day (BID) | ORAL | Status: DC

## 2016-05-23 MED ORDER — CARVEDILOL 6.25 MG PO TABS: 6.2500 mg | ORAL_TABLET | Freq: Two times a day (BID) | ORAL | Status: DC

## 2016-05-23 MED ORDER — FUROSEMIDE 40 MG PO TABS: 40.0000 mg | ORAL_TABLET | Freq: Two times a day (BID) | ORAL | Status: DC

## 2016-05-23 NOTE — Discharge Summary (Signed)
Physician Discharge Summary  Dustin [name] ZOX:[id number] DOB: 1926/04/02 DOA: 05/18/2016  PCP: Crawford Givens, MD  Admit date: 05/18/2016 Discharge date: 05/23/2016  Time spent: > 30 minutes  Recommendations for Outpatient Follow-up:  1. Follow up with Dr. Luiz Blare in 2 weeks  Discharge Diagnoses:  Principal Problem:   Hip fracture, left (HCC) Active Problems:   Essential hypertension   S/P CABG (coronary artery bypass graft)   Diastolic congestive heart failure (HCC)   Emphysema of lung (HCC)   Postoperative anemia due to acute blood loss   AKI (acute kidney injury) [facility])   Discharge Condition: stable  Diet recommendation: heart healthy  Filed Weights   05/21/16 0443 05/22/16 0458 05/23/16 0634  Weight: 90.901 kg (200 lb 6.4 oz) 89.903 kg (198 lb 3.2 oz) 88.724 kg (195 lb 9.6 oz)    History of present illness:  See H&P, Labs, Consult and Test reports for all details in brief, patient is a Patient was admitted on 05/18/2016, with complaint of mechanical fall, was found to have left hip fracture. Patient underwent intramedullary nail procedure 05/18/2016.   Hospital Course:  Hip fracture, left (HCC) - Status post intramedullary nail implant 05/18/2016. PTOT consult recommends SNF. Prophylaxis with Aspirin 325 BID per orthopedic surgery recommendations.  Acute kidney injury. - Postoperative, possible hypotension peri procedurally, nephrology was consulted and followed. He was initially given IVF, renal failure has improved and he was transitioned to Lasix. His renal function has remained stable and he is to continue Lasix on d/c. Repeat renal function in 1 week. Hold Losartan on d/c and consider resuming if renal function normalizes. Postoperative acute blood loss anemia - Hemogram dropped from 11-7.8 >> 8.1 >> 8.7 >> 8.1. Stable, no need for transfusion. Did receive 1u pRBC 5/29. Repeat CBC in 1 week Acute on chronic diastolic congestive heart failure - Lasix as above Essential  hypertension - Patient is on Cardizem 300 mg at home, will continue. WIth Lasix and improvement in his fluid status BP will gradually stabilize. Please follow.  Coronary artery disease, status post CABG - Continue aspirin continue Lipitor. Acute encephalopathy. - Likely delirium, minimize narcotics. Resolved  Procedures:  IM nail 5/29   Consultations:  Orthopedic surgery  Nephrology  Discharge Exam: Filed Vitals:   05/22/16 2143 05/22/16 2300 05/23/16 0604 05/23/16 0634  BP: 187/61 160/100 185/63   Pulse: 73 76 68   Temp: 98.2 F (36.8 C)  98.4 F (36.9 C)   TempSrc: Oral  Oral   Resp: 18  18   Height:      Weight:    88.724 kg (195 lb 9.6 oz)  SpO2: 99%  100%     General: NAD Cardiovascular: RRR Respiratory: CTA biL  Discharge Instructions Activity:  As tolerated   Get Medicines reviewed and adjusted: Please take all your medications with you for your next visit with your Primary MD  Please request your Primary MD to go over all hospital tests and procedure/radiological results at the follow up, please ask your Primary MD to get all Hospital records sent to his/her office.  If you experience worsening of your admission symptoms, develop shortness of breath, life threatening emergency, suicidal or homicidal thoughts you must seek medical attention immediately by calling 911 or calling your MD immediately if symptoms less severe.  You must read complete instructions/literature along with all the possible adverse reactions/side effects for all the Medicines you take and that have been prescribed to you. Take any new Medicines after you have  completely understood and accpet all the possible adverse reactions/side effects.   Do not drive when taking Pain medications.   Do not take more than prescribed Pain, Sleep and Anxiety Medications  Special Instructions: If you have smoked or chewed Tobacco in the last 2 yrs please stop smoking, stop any regular Alcohol and or  any Recreational drug use.  Wear Seat belts while driving.  Please note  You were cared for by a hospitalist during your hospital stay. Once you are discharged, your primary care physician will handle any further medical issues. Please note that NO REFILLS for any discharge medications will be authorized once you are discharged, as it is imperative that you return to your primary care physician (or establish a relationship with a primary care physician if you do not have one) for your aftercare needs so that they can reassess your need for medications and monitor your lab values. Discharge Instructions    Touch down weight bearing    Complete by:  As directed   Laterality:  left  Extremity:  Lower            Medication List    STOP taking these medications        aspirin 81 MG tablet     losartan 50 MG tablet  Commonly known as:  COZAAR      TAKE these medications        allopurinol 300 MG tablet  Commonly known as:  ZYLOPRIM  Take 300 mg by mouth daily.     ASCRIPTIN 325 MG Tabs  Take 325 mg by mouth 2 (two) times daily. For 4 weeks     atorvastatin 20 MG tablet  Commonly known as:  LIPITOR  Take 1 tablet (20 mg total) by mouth daily at 6 PM.     cholecalciferol 1000 units tablet  Commonly known as:  VITAMIN D  Take 1,000 Units by mouth daily.     clotrimazole-betamethasone cream  Commonly known as:  LOTRISONE  APPLY TO AFFECTED AREA TWICE DAILY AS NEEDED     diltiazem 300 MG 24 hr capsule  Commonly known as:  TIAZAC  Take 300 mg by mouth daily.     docusate sodium 100 MG capsule  Commonly known as:  COLACE  Take 200 mg by mouth daily.     dutasteride 0.5 MG capsule  Commonly known as:  AVODART  Take 0.5 mg by mouth at bedtime.     furosemide 40 MG tablet  Commonly known as:  LASIX  Take 1 tablet (40 mg total) by mouth 2 (two) times daily.     HYDROcodone-acetaminophen 5-325 MG tablet  Commonly known as:  NORCO  Take 1 tablet by mouth every 6 (six)  hours as needed for moderate pain.     potassium chloride SA 20 MEQ tablet  Commonly known as:  K-DUR,KLOR-CON  Take with lasix     vitamin C 500 MG tablet  Commonly known as:  ASCORBIC ACID  Take 1,000 mg by mouth daily.     vitamin E 1000 UNIT capsule  Take 1,000 Units by mouth daily.           Follow-up Information    Follow up with GRAVES,JOHN L, MD. Schedule an appointment as soon as possible for a visit in 2 weeks.   Specialty:  Orthopedic Surgery   Contact information:   [address] [place] [address] [phone]       Follow up with [facility] .  Specialty:  Skilled Nursing Facility   Contact information:   [address] [place] [address] [phone]      The results of significant diagnostics from this hospitalization (including imaging, microbiology, ancillary and laboratory) are listed below for reference.    Significant Diagnostic Studies: Dg Chest 1 View  05/18/2016  CLINICAL DATA:  Dizziness after getting out of bed and falling. EXAM: CHEST 1 VIEW COMPARISON:  09/08/2015. FINDINGS: Normal sized heart. Small amount of linear density at the left lung base. Stable prominence of the interstitial markings, hyper expansion of the lungs and diffuse peribronchial thickening. Diffuse osteopenia. Old, healed right humeral neck fracture. Post CABG changes. IMPRESSION: 1. Minimal linear atelectasis or scarring at the left lung base. 2. Stable changes of COPD and chronic bronchitis. Electronically Signed   By: Beckie Salts M.D.   On: 05/18/2016 11:53   Ct Head Wo Contrast  05/18/2016  CLINICAL DATA:  Status post fall, left hip pain EXAM: CT HEAD WITHOUT CONTRAST TECHNIQUE: Contiguous axial images were obtained from the base of the skull through the vertex without intravenous contrast. COMPARISON:  None. FINDINGS: There is no evidence of mass effect, midline shift, or extra-axial fluid collections. There is no evidence of a  space-occupying lesion or intracranial hemorrhage. There is no evidence of a cortical-based area of acute infarction. 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 There is generalized cerebral atrophy. There is periventricular white matter low attenuation likely secondary to microangiopathy. The ventricles and sulci are appropriate for the patient's age. The basal cisterns are patent. Visualized portions of the orbits are unremarkable. There is right maxillary sinus mucosal thickening. Cerebrovascular atherosclerotic calcifications are noted. The osseous structures are unremarkable. IMPRESSION: No acute intracranial pathology. Electronically Signed   By: Elige Ko   On: 05/18/2016 12:16   US Renal  05/20/2016  CLINICAL DATA:  82 year old hypertensive male with acute renal failure. Initial encounter. EXAM: RENAL / URINARY TRACT ULTRASOUND COMPLETE COMPARISON:  None. FINDINGS: Right Kidney: Length: 10.2 cm. Mild renal parenchymal thinning. Echogenic renal parenchyma. Findings may indicate changes of medical renal disease. No hydronephrosis or mass noted. Left Kidney: Length: 10.7 cm. 7.1 x 3 x 5.6 cm bilobed cyst mid to upper aspect of the left kidney. Mild renal parenchymal thinning. Echogenic renal parenchyma. Findings may indicate changes of medical renal disease. Bladder: Foley catheter in place and bladder decompressed with evaluation therefore limited. IMPRESSION: No hydronephrosis. Mild bilateral renal parenchymal thinning and increased parenchymal echogenicity. Findings may indicate changes of medical renal disease. Large bilobed left renal cyst measuring up to 7.1 cm. Electronically Signed   By: Lacy Duverney M.D.   On: 05/20/2016 15:46   Dg C-arm 61-120 Min  05/18/2016  CLINICAL DATA:  Patient status post ORIF left femur fracture. EXAM: DG C-ARM 61-120 MIN; LEFT FEMUR 2 VIEWS COMPARISON:  Hip radiograph 05/18/2016. FINDINGS: Patient status post intra medullary rod and screw  fixation of comminuted intratrochanteric left femur fracture. Improved anatomic alignment at the fracture site. Four intraoperative fluoroscopic images were submitted. IMPRESSION: Patient status post intra medullary rod fixation comminuted proximal left femur fracture. Electronically Signed   By: Annia Belt M.D.   On: 05/18/2016 18:47   Dg Hip Unilat With Pelvis 2-3 Views Left  05/18/2016  CLINICAL DATA:  Left hip pain after or getting out of bed and falling this morning. EXAM: DG HIP (WITH OR WITHOUT PELVIS) 2-3V LEFT COMPARISON:  None. FINDINGS: Comminuted left intertrochanteric fracture with varus angulation. Diffuse  osteopenia. Bilateral iliac artery stents. Atheromatous arterial calcifications. IMPRESSION: Comminuted left intertrochanteric fracture with varus angulation. Electronically Signed   By: Beckie SaltsSteven  Reid M.D.   On: 05/18/2016 11:55   Dg Femur Min 2 Views Left  05/18/2016  CLINICAL DATA:  Patient status post ORIF left femur fracture. EXAM: DG C-ARM 61-120 MIN; LEFT FEMUR 2 VIEWS COMPARISON:  Hip radiograph 05/18/2016. FINDINGS: Patient status post intra medullary rod and screw fixation of comminuted intratrochanteric left femur fracture. Improved anatomic alignment at the fracture site. Four intraoperative fluoroscopic images were submitted. IMPRESSION: Patient status post intra medullary rod fixation comminuted proximal left femur fracture. Electronically Signed   By: Annia Beltrew  Davis M.D.   On: 05/18/2016 18:47    Microbiology: No results found for this or any previous visit (from the past 240 hour(s)).   Labs: Basic Metabolic Panel:  Recent Labs Lab 05/18/16 1111 05/19/16 0453 05/20/16 0436 05/20/16 1254 05/21/16 0226 05/22/16 0609 05/23/16 0345  NA 140 140 139 139 140 142 140  K 3.6 4.5 4.3 4.5 4.4 4.6 4.1  CL 105 106 106 107 108 111 105  CO2 29 27 27 26 28 23 28   GLUCOSE 126* 144* 139* 136* 108* 109* 100*  BUN 17 24* 36* 41* 43* 41* 44*  CREATININE 1.12 1.62* 2.56* 2.53*  2.42* 1.70* 1.66*  CALCIUM 8.4* 7.9* 7.6* 7.7* 7.4* 8.0* 8.1*  MG  --   --   --   --  1.9  --   --   PHOS  --   --   --  4.7*  --  3.0 4.2   Liver Function Tests:  Recent Labs Lab 05/18/16 1111 05/20/16 0436 05/20/16 1254 05/21/16 0226 05/22/16 0609 05/23/16 0345  AST 13* 17  --  19  --   --   ALT 11* 6*  --  5*  --   --   ALKPHOS 61 48  --  47  --   --   BILITOT 0.7 0.4  --  0.3  --   --   PROT 5.1* 4.4*  --  4.2*  --   --   ALBUMIN 2.9* 2.4* 2.3* 2.0* 2.0* 1.8*   No results for input(s): LIPASE, AMYLASE in the last 168 hours. No results for input(s): AMMONIA in the last 168 hours. CBC:  Recent Labs Lab 05/18/16 1111  05/20/16 0436 05/20/16 1254 05/21/16 0226 05/22/16 0609 05/23/16 0345  WBC 8.8  < > 12.7* 14.3* 10.3 11.2* 9.8  NEUTROABS 7.4  --  10.0*  --  8.2*  --   --   HGB 11.0*  < > 7.8* 8.9* 8.1* 8.7* 8.1*  HCT 35.8*  < > 25.1* 28.1* 25.3* 27.8* 25.5*  MCV 86.1  < > 86.9 88.6 88.5 88.5 87.6  PLT 137*  < > 110* 105* 117* 154 174  < > = values in this interval not displayed. Cardiac Enzymes:  Recent Labs Lab 05/20/16 1254  CKTOTAL 615*   BNP: BNP (last 3 results)  Recent Labs  09/08/15 1808  BNP 109.1*    ProBNP (last 3 results)  Recent Labs  09/11/15 1652  PROBNP 117.0*    CBG: No results for input(s): GLUCAP in the last 168 hours.     SignedPamella Pert:  GHERGHE, COSTIN  [facility] 05/23/2016, 10:27 AM

## 2016-05-23 NOTE — Progress Notes (Signed)
Subjective: Interval History: has complaints wants to get up, wants to go to rehab.  Objective: Vital signs in last 24 hours: Temp:  [98.2 F (36.8 C)-98.4 F (36.9 C)] 98.4 F (36.9 C) (06/01 0604) Pulse Rate:  [68-76] 68 (06/01 0604) Resp:  [18] 18 (06/01 0604) BP: (160-187)/(61-100) 185/63 mmHg (06/01 0604) SpO2:  [99 %-100 %] 100 % (06/01 0604) Weight:  [88.724 kg (195 lb 9.6 oz)] 88.724 kg (195 lb 9.6 oz) (06/01 0634) Weight change: -1.179 kg (-2 lb 9.6 oz)  Intake/Output from previous day: 05/31 0701 - 06/01 0700 In: 722.5 [P.O.:475; I.V.:247.5] Out: 2001 [Urine:2000; Stool:1] Intake/Output this shift:    General appearance: alert, cooperative, no distress and pale Resp: diminished breath sounds bilaterally and rales bibasilar Cardio: S1, S2 normal and systolic murmur: holosystolic 2/6, blowing at apex GI: pos bs, soft, Extremities: edema 3+  Lab Results:  Recent Labs  05/22/16 0609 05/23/16 0345  WBC 11.2* 9.8  HGB 8.7* 8.1*  HCT 27.8* 25.5*  PLT 154 174   BMET:  Recent Labs  05/22/16 0609 05/23/16 0345  NA 142 140  K 4.6 4.1  CL 111 105  CO2 23 28  GLUCOSE 109* 100*  BUN 41* 44*  CREATININE 1.70* 1.66*  CALCIUM 8.0* 8.1*   No results for input(s): PTH in the last 72 hours. Iron Studies:  Recent Labs  05/20/16 1254  IRON 45  TIBC 183*    Studies/Results: No results found.  I have reviewed the patient's current medications.  Assessment/Plan: 1 AKI plateau but trend better. Diuresing,will cont Lasix. Should slowly get better  2 HTN will ^ carvedilol 3 Anemia blood loss. signif 4 Hip fx per ortho 5 CAD 6 AAA 7 CHF improving P ^ coreg,cont lasix,   Ok for rehab from my standpoint, will s/o for now   LOS: 5 days   Samantha Ragen L 05/23/2016,8:59 AM

## 2016-05-23 NOTE — Clinical Social Work Note (Signed)
CSW facilitated patient discharge including contacting patient family and facility to confirm patient discharge plans. Clinical information faxed to facility and family agreeable with plan. CSW [facility] 1:[facility]. RN to call report prior to discharge [phone]).  CSW will sign off for now as social work intervention is no longer needed. Please consult us again if new needs arise.  Charlynn CourtSarah Joleena Weisenburger, CSW [phone]

## 2016-05-23 NOTE — Care Management Important Message (Signed)
Important Message  Patient Details  Name: Dustin [name] MRN: [id number] Date of Birth: 03/04/1926   Medicare Important Message Given:  Yes    Bernadette HoitShoffner, Tywanda Rice Coleman 05/23/2016, 10:23 AM

## 2016-05-23 NOTE — Progress Notes (Signed)
Called [address], [place] [phone]), gave report to OlivetMisty, receiving nurse.  At discharge, pt was in no acute distress.  Receiving nurse had no further questions.

## 2016-05-27 ENCOUNTER — Encounter: Payer: Self-pay | Admitting: Internal Medicine

## 2016-05-27 ENCOUNTER — Non-Acute Institutional Stay (SKILLED_NURSING_FACILITY): Payer: Medicare Other | Admitting: Internal Medicine

## 2016-05-27 DIAGNOSIS — R4189 Other symptoms and signs involving cognitive functions and awareness: Secondary | ICD-10-CM | POA: Diagnosis not present

## 2016-05-27 DIAGNOSIS — R1310 Dysphagia, unspecified: Secondary | ICD-10-CM | POA: Diagnosis not present

## 2016-05-27 DIAGNOSIS — R531 Weakness: Secondary | ICD-10-CM

## 2016-05-27 DIAGNOSIS — D62 Acute posthemorrhagic anemia: Secondary | ICD-10-CM

## 2016-05-27 DIAGNOSIS — S72002S Fracture of unspecified part of neck of left femur, sequela: Secondary | ICD-10-CM | POA: Diagnosis not present

## 2016-05-27 DIAGNOSIS — R600 Localized edema: Secondary | ICD-10-CM

## 2016-05-27 DIAGNOSIS — R2681 Unsteadiness on feet: Secondary | ICD-10-CM

## 2016-05-27 DIAGNOSIS — I25709 Atherosclerosis of coronary artery bypass graft(s), unspecified, with unspecified angina pectoris: Secondary | ICD-10-CM | POA: Diagnosis not present

## 2016-05-27 DIAGNOSIS — I5032 Chronic diastolic (congestive) heart failure: Secondary | ICD-10-CM

## 2016-05-27 DIAGNOSIS — I10 Essential (primary) hypertension: Secondary | ICD-10-CM | POA: Diagnosis not present

## 2016-05-27 DIAGNOSIS — K5900 Constipation, unspecified: Secondary | ICD-10-CM

## 2016-05-27 DIAGNOSIS — E785 Hyperlipidemia, unspecified: Secondary | ICD-10-CM

## 2016-05-27 DIAGNOSIS — N179 Acute kidney failure, unspecified: Secondary | ICD-10-CM

## 2016-05-27 DIAGNOSIS — J438 Other emphysema: Secondary | ICD-10-CM | POA: Diagnosis not present

## 2016-05-27 LAB — UIFE/LIGHT CHAINS/TP QN, 24-HR UR
% BETA, Urine: UNDETERMINED %
ALBUMIN, U: UNDETERMINED %
ALPHA 1 URINE: UNDETERMINED %
ALPHA 2 UR: UNDETERMINED %
FREE KAPPA/LAMBDA RATIO: 18.4 — AB (ref 2.04–10.37)
FREE LAMBDA LT CHAINS, UR: 17.5 mg/L — AB (ref 0.24–6.66)
FREE LT CHN EXCR RATE: 322 mg/L — AB (ref 1.35–24.19)
GAMMA GLOBULIN URINE: UNDETERMINED %
IMMUNOFIXATION RESULT, URINE: UNDETERMINED
M-SPIKE %, URINE: UNDETERMINED %
Total Protein, Urine: 136.4 mg/dL

## 2016-05-27 NOTE — Progress Notes (Signed)
LOCATION: [facility]  PCP: Crawford Givens, MD   Code Status: Full Code  Goals of care: Advanced Directive information Advanced Directives 05/18/2016  Does patient have an advance directive? No  Type of Advance Directive -  Does patient want to make changes to advanced directive? No - Patient declined  Would patient like information on creating an advanced directive? No - patient declined information       Extended Emergency Contact Information Primary Emergency Contact: Blumenthal,Jane Address: [address] [place], [place] [address] [place] Home Phone: [phone] Mobile Phone: [phone] Relation: Spouse Secondary Emergency Contact: [name]  [place] Home Phone: [phone] Mobile Phone: [phone] Relation: Daughter   No Known Allergies  Chief Complaint  Patient presents with  . New Admit To SNF    New Admission     HPI:  Patient is a 82 y.o. male seen today for short term rehabilitation post hospital admission from 05/18/16-05/23/16 with left hip fracture. He underwent IM nailing on 05/18/16. He had AKI post op nad received iv fluids. Nephrology was consulted. He had ABLA and received 1 u prbc transfusion. He also had mild chf exacerbation and received lasix. He is seen in his room today. His pain is under control at present. He feels weak and tired.   Review of Systems:  Constitutional: Negative for fever, chills, diaphoresis. Feels weak and tired. HENT: Negative for headache, congestion, nasal discharge, hearing loss, sore throat. Positive for difficulty swallowing pills.   Eyes: Negative for blurred vision, double vision and discharge.  Respiratory: Negative for cough, shortness of breath and wheezing. On 3 liters of oxygen. Was on oxygen continuous at home.   Cardiovascular: Negative for chest pain, palpitations, leg swelling.  Gastrointestinal: Negative for vomiting, abdominal pain, diarrhea and  constipation. Positive for heartburn, nausea, and poor appetite. Last bowel movement was yesterday. Genitourinary: Negative for dysuria and flank pain.  Musculoskeletal: Negative for back pain, fall in the facility.  Skin: Negative for itching, rash.  Neurological: Positive for occasional dizziness. Psychiatric/Behavioral: Negative for depression.    Past Medical History  Diagnosis Date  . Hyperlipidemia   . Hypertension   . Abdominal aortic aneurysm (HCC)     with endoleak per Dr. Janetta Hora, aneurysmal sac size stable as of 12/11 (7.5 x 6.1cm)  . Benign prostatic hypertrophy   . History of chickenpox   . Dysuria   . Heart disease   . Gout   . Sigmoid diverticulosis   . Melanoma in situ [facility])     Per [facility] 2014  . MI (myocardial infarction) (HCC)   . Coronary artery disease   . Pulmonary fibrosis (HCC)   . CHF (congestive heart failure) (HCC)   . Emphysema lung [facility])    Past Surgical History  Procedure Laterality Date  . Hernia repair  2011    Right  . Coronary artery bypass graft  1991  . Bilateral cataract surgery  2011  . Aaa stent  2008  . Left cea  1997  . Intramedullary (im) nail intertrochanteric Left 05/18/2016    Procedure: INTRAMEDULLARY (IM) NAIL INTERTROCHANTRIC;  Surgeon: Jodi Geralds, MD;  Location: [facility] OR;  Service: Orthopedics;  Laterality: Left;   Social History:   reports that he quit smoking about 27 years ago. His smoking use included Cigarettes. He has a 40 pack-year smoking history. He has never used smokeless tobacco. He reports that he drinks alcohol.  He reports that he does not use illicit drugs.  Family History  Problem Relation Age of Onset  . Stroke Mother   . Heart disease Father 52    Sudden death  . Heart attack Father     Medications:   Medication List       This list is accurate as of: 05/27/16 12:03 PM.  Always use your most recent med list.               allopurinol 300 MG tablet  Commonly known as:   ZYLOPRIM  Take 300 mg by mouth daily.     ASCRIPTIN 325 MG Tabs  Take 325 mg by mouth 2 (two) times daily. For 4 weeks     atorvastatin 20 MG tablet  Commonly known as:  LIPITOR  Take 1 tablet (20 mg total) by mouth daily at 6 PM.     cholecalciferol 1000 units tablet  Commonly known as:  VITAMIN D  Take 1,000 Units by mouth daily.     diltiazem 300 MG 24 hr capsule  Commonly known as:  TIAZAC  Take 300 mg by mouth daily.     docusate sodium 100 MG capsule  Commonly known as:  COLACE  Take 200 mg by mouth daily.     dutasteride 0.5 MG capsule  Commonly known as:  AVODART  Take 0.5 mg by mouth at bedtime.     furosemide 40 MG tablet  Commonly known as:  LASIX  Take 1 tablet (40 mg total) by mouth 2 (two) times daily.     HYDROcodone-acetaminophen 5-325 MG tablet  Commonly known as:  NORCO  Take 1 tablet by mouth every 6 (six) hours as needed for moderate pain.     potassium chloride SA 20 MEQ tablet  Commonly known as:  K-DUR,KLOR-CON  Take 20 mEq by mouth 2 (two) times daily. Take with Lasix     vitamin C 500 MG tablet  Commonly known as:  ASCORBIC ACID  Take 1,000 mg by mouth daily.     vitamin E 1000 UNIT capsule  Take 1,000 Units by mouth daily.        Immunizations: Immunization History  Administered Date(s) Administered  . Influenza Split 10/05/2012  . Influenza,inj,Quad PF,36+ Mos 10/13/2013, 09/27/2014, 09/20/2015  . PPD Test 05/23/2016  . Pneumococcal Polysaccharide-23 12/24/2007  . Zoster 12/23/2008     Physical Exam: Filed Vitals:   05/27/16 1155  BP: 134/80  Pulse: 76  Temp: 98 F (36.7 C)  TempSrc: Oral  Resp: 20  Height:  (1.702 m)  Weight: 191 lb 8 oz (86.864 kg)  SpO2: 98%   Body mass index is 29.99 kg/(m^2).  General- elderly overweight male, in no acute distress Head- normocephalic, atraumatic Nose- no maxillary or frontal sinus tenderness, no nasal discharge Throat- moist mucus membrane Eyes- PERRLA, EOMI, no pallor,  no icterus, no discharge, normal conjunctiva, normal sclera Neck- no cervical lymphadenopathy Cardiovascular- normal s1,s2, no murmur, 1+ leg edema Respiratory- bilateral poor air movement, no wheeze, no rhonchi, no crackles, no use of accessory muscles, on o2 Abdomen- bowel sounds present, soft, non tender Musculoskeletal- able to move all 4 extremities, generalized weakness, limited left hip range of motion Neurological- alert and oriented to person and place Skin- warm and dry, left hip surgical incision with staples in place with mild peri-incision erythema Psychiatry- normal mood and affect    Labs reviewed: Basic Metabolic Panel:  Recent Labs  40/98/11 1254 05/21/16 0226 05/22/16 0609 05/23/16  0345  NA 139 140 142 140  K 4.5 4.4 4.6 4.1  CL 107 108 111 105  CO2 26 28 23 28   GLUCOSE 136* 108* 109* 100*  BUN 41* 43* 41* 44*  CREATININE 2.53* 2.42* 1.70* 1.66*  CALCIUM 7.7* 7.4* 8.0* 8.1*  MG  --  1.9  --   --   PHOS 4.7*  --  3.0 4.2   Liver Function Tests:  Recent Labs  05/18/16 1111 05/20/16 0436  05/21/16 0226 05/22/16 0609 05/23/16 0345  AST 13* 17  --  19  --   --   ALT 11* 6*  --  5*  --   --   ALKPHOS 61 48  --  47  --   --   BILITOT 0.7 0.4  --  0.3  --   --   PROT 5.1* 4.4*  --  4.2*  --   --   ALBUMIN 2.9* 2.4*  < > 2.0* 2.0* 1.8*  < > = values in this interval not displayed. No results for input(s): LIPASE, AMYLASE in the last 8760 hours. No results for input(s): AMMONIA in the last 8760 hours. CBC:  Recent Labs  05/18/16 1111  05/20/16 0436  05/21/16 0226 05/22/16 0609 05/23/16 0345  WBC 8.8  < > 12.7*  < > 10.3 11.2* 9.8  NEUTROABS 7.4  --  10.0*  --  8.2*  --   --   HGB 11.0*  < > 7.8*  < > 8.1* 8.7* 8.1*  HCT 35.8*  < > 25.1*  < > 25.3* 27.8* 25.5*  MCV 86.1  < > 86.9  < > 88.5 88.5 87.6  PLT 137*  < > 110*  < > 117* 154 174  < > = values in this interval not displayed. Cardiac Enzymes:  Recent Labs  09/08/15 1808 05/20/16 1254   CKTOTAL  --  615*  TROPONINI <0.03  --    BNP: Invalid input(s): POCBNP CBG: No results for input(s): GLUCAP in the last 8760 hours.  Radiological Exams: Dg Chest 1 View  05/18/2016  CLINICAL DATA:  Dizziness after getting out of bed and falling. EXAM: CHEST 1 VIEW COMPARISON:  09/08/2015. FINDINGS: Normal sized heart. Small amount of linear density at the left lung base. Stable prominence of the interstitial markings, hyper expansion of the lungs and diffuse peribronchial thickening. Diffuse osteopenia. Old, healed right humeral neck fracture. Post CABG changes. IMPRESSION: 1. Minimal linear atelectasis or scarring at the left lung base. 2. Stable changes of COPD and chronic bronchitis. Electronically Signed   By: Beckie Salts M.D.   On: 05/18/2016 11:53   Ct Head Wo Contrast  05/18/2016  CLINICAL DATA:  Status post fall, left hip pain EXAM: CT HEAD WITHOUT CONTRAST TECHNIQUE: Contiguous axial images were obtained from the base of the skull through the vertex without intravenous contrast. COMPARISON:  None. FINDINGS: There is no evidence of mass effect, midline shift, or extra-axial fluid collections. There is no evidence of a space-occupying lesion or intracranial hemorrhage. There is no evidence of a cortical-based area of acute infarction. 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 There is generalized cerebral atrophy. There is periventricular white matter low attenuation likely secondary to microangiopathy. The ventricles and sulci are appropriate for the patient's age. The basal cisterns are patent. Visualized portions of the orbits are unremarkable. There is right maxillary sinus mucosal thickening. Cerebrovascular atherosclerotic calcifications are noted. The osseous structures are unremarkable. IMPRESSION: No acute intracranial  pathology. Electronically Signed   By: Elige KoHetal  Patel   On: 05/18/2016 12:16   [place] Renal  05/20/2016  CLINICAL DATA:  82 year old hypertensive  male with acute renal failure. Initial encounter. EXAM: RENAL / URINARY TRACT ULTRASOUND COMPLETE COMPARISON:  None. FINDINGS: Right Kidney: Length: 10.2 cm. Mild renal parenchymal thinning. Echogenic renal parenchyma. Findings may indicate changes of medical renal disease. No hydronephrosis or mass noted. Left Kidney: Length: 10.7 cm. 7.1 x 3 x 5.6 cm bilobed cyst mid to upper aspect of the left kidney. Mild renal parenchymal thinning. Echogenic renal parenchyma. Findings may indicate changes of medical renal disease. Bladder: Foley catheter in place and bladder decompressed with evaluation therefore limited. IMPRESSION: No hydronephrosis. Mild bilateral renal parenchymal thinning and increased parenchymal echogenicity. Findings may indicate changes of medical renal disease. Large bilobed left renal cyst measuring up to 7.1 cm. Electronically Signed   By: Lacy DuverneySteven  Olson M.D.   On: 05/20/2016 15:46   Dg C-arm 61-120 Min  05/18/2016  CLINICAL DATA:  Patient status post ORIF left femur fracture. EXAM: DG C-ARM 61-120 MIN; LEFT FEMUR 2 VIEWS COMPARISON:  Hip radiograph 05/18/2016. FINDINGS: Patient status post intra medullary rod and screw fixation of comminuted intratrochanteric left femur fracture. Improved anatomic alignment at the fracture site. Four intraoperative fluoroscopic images were submitted. IMPRESSION: Patient status post intra medullary rod fixation comminuted proximal left femur fracture. Electronically Signed   By: Annia Beltrew  Davis M.D.   On: 05/18/2016 18:47   Dg Hip Unilat With Pelvis 2-3 Views Left  05/18/2016  CLINICAL DATA:  Left hip pain after or getting out of bed and falling this morning. EXAM: DG HIP (WITH OR WITHOUT PELVIS) 2-3V LEFT COMPARISON:  None. FINDINGS: Comminuted left intertrochanteric fracture with varus angulation. Diffuse osteopenia. Bilateral iliac artery stents. Atheromatous arterial calcifications. IMPRESSION: Comminuted left intertrochanteric fracture with varus angulation.  Electronically Signed   By: Beckie SaltsSteven  Reid M.D.   On: 05/18/2016 11:55   Dg Femur Min 2 Views Left  05/18/2016  CLINICAL DATA:  Patient status post ORIF left femur fracture. EXAM: DG C-ARM 61-120 MIN; LEFT FEMUR 2 VIEWS COMPARISON:  Hip radiograph 05/18/2016. FINDINGS: Patient status post intra medullary rod and screw fixation of comminuted intratrochanteric left femur fracture. Improved anatomic alignment at the fracture site. Four intraoperative fluoroscopic images were submitted. IMPRESSION: Patient status post intra medullary rod fixation comminuted proximal left femur fracture. Electronically Signed   By: Annia Beltrew  Davis M.D.   On: 05/18/2016 18:47    Assessment/Plan  Generalized weakness Will have him work with physical therapy and occupational therapy team to help with gait training and muscle strengthening exercises.fall precautions. Skin care. Encourage to be out of bed.   Unsteady gait With left hip fracture. Will need him to work with PT/OT  Left hip fracture S/p left IM nailing. Will have him work with physical therapy and occupational therapy team to help with gait training and muscle strengthening exercises.fall precautions. Skin care. Encourage to be out of bed. Continue aspirin 325 mg bid x 4 weeks for DVT prophylaxis. Continue vitamin d supplement. Continue norco 5-325 mg q6h prn pain. Has orthopedics follow up.   ABLA S/p 1 u prbc, monitor cbc  AKI Monitor bmp  HLD Continue statin  CAD S/p CABG, continue diltiazem  Constipation Continue colace 200 mg daily  COPD Continue o2, continue avodart  Diastolic chf Continue furosemide 40 mg bid, monitor bmp, continue kcl  HTN Stable, monitor bp and continue diltiazem and lasix.   Leg edema Add  ted hose, continue lasix for now  Dysphagia Get SLP consult  Cognitive impairment Minimize narcotics and sedatives. Provide supportive care. Check tsh and b12. Get BIMS . Lab Results  Component Value Date   TSH 1.27  08/29/2015      Goals of care: short term rehabilitation   Labs/tests ordered: cbc, cmp  Family/ staff Communication: reviewed care plan with patient and nursing supervisor    Oneal Grout, MD Internal Medicine [facility] [address] [place], [place] [address] Cell Phone (Monday-Friday 8 am - 5 pm): [phone] On Call: [phone] and follow prompts after 5 pm and on weekends Office Phone: [phone] Office Fax: [phone]

## 2016-05-28 LAB — BASIC METABOLIC PANEL
BUN: 111 mg/dL — AB (ref 4–21)
CREATININE: 5.9 mg/dL — AB (ref 0.6–1.3)
GLUCOSE: 136 mg/dL
POTASSIUM: 6 mmol/L — AB (ref 3.4–5.3)
SODIUM: 136 mmol/L — AB (ref 137–147)

## 2016-05-28 LAB — CBC AND DIFFERENTIAL
HCT: 29 % — AB (ref 41–53)
Hemoglobin: 9.3 g/dL — AB (ref 13.5–17.5)
Platelets: 309 10*3/uL (ref 150–399)
WBC: 21 10*3/mL

## 2016-05-28 LAB — HEPATIC FUNCTION PANEL
ALT: 11 U/L (ref 10–40)
AST: 15 U/L (ref 14–40)
Alkaline Phosphatase: 79 U/L (ref 25–125)
BILIRUBIN, TOTAL: 0.4 mg/dL

## 2016-05-28 LAB — TSH: TSH: 1.1 u[IU]/mL (ref 0.41–5.90)

## 2016-05-29 ENCOUNTER — Emergency Department (HOSPITAL_COMMUNITY): Payer: Medicare Other

## 2016-05-29 ENCOUNTER — Encounter: Payer: Self-pay | Admitting: Family

## 2016-05-29 ENCOUNTER — Non-Acute Institutional Stay (SKILLED_NURSING_FACILITY): Payer: Medicare Other | Admitting: Family

## 2016-05-29 ENCOUNTER — Inpatient Hospital Stay (HOSPITAL_COMMUNITY)
Admission: EM | Admit: 2016-05-29 | Discharge: 2016-06-03 | DRG: 871 | Disposition: A | Discharge status: Skilled Nursing Facility | Payer: Medicare Other | Attending: Family Medicine | Admitting: Family Medicine

## 2016-05-29 ENCOUNTER — Encounter (HOSPITAL_COMMUNITY): Payer: Self-pay | Admitting: Emergency Medicine

## 2016-05-29 DIAGNOSIS — Z951 Presence of aortocoronary bypass graft: Secondary | ICD-10-CM

## 2016-05-29 DIAGNOSIS — J8410 Pulmonary fibrosis, unspecified: Secondary | ICD-10-CM | POA: Diagnosis present

## 2016-05-29 DIAGNOSIS — A419 Sepsis, unspecified organism: Secondary | ICD-10-CM | POA: Diagnosis present

## 2016-05-29 DIAGNOSIS — D72829 Elevated white blood cell count, unspecified: Secondary | ICD-10-CM | POA: Diagnosis not present

## 2016-05-29 DIAGNOSIS — E875 Hyperkalemia: Secondary | ICD-10-CM | POA: Diagnosis present

## 2016-05-29 DIAGNOSIS — R140 Abdominal distension (gaseous): Secondary | ICD-10-CM | POA: Diagnosis not present

## 2016-05-29 DIAGNOSIS — M109 Gout, unspecified: Secondary | ICD-10-CM | POA: Diagnosis present

## 2016-05-29 DIAGNOSIS — D649 Anemia, unspecified: Secondary | ICD-10-CM | POA: Diagnosis present

## 2016-05-29 DIAGNOSIS — Z87891 Personal history of nicotine dependence: Secondary | ICD-10-CM | POA: Diagnosis not present

## 2016-05-29 DIAGNOSIS — I252 Old myocardial infarction: Secondary | ICD-10-CM

## 2016-05-29 DIAGNOSIS — I5032 Chronic diastolic (congestive) heart failure: Secondary | ICD-10-CM | POA: Diagnosis present

## 2016-05-29 DIAGNOSIS — E869 Volume depletion, unspecified: Secondary | ICD-10-CM | POA: Diagnosis present

## 2016-05-29 DIAGNOSIS — N390 Urinary tract infection, site not specified: Secondary | ICD-10-CM | POA: Diagnosis present

## 2016-05-29 DIAGNOSIS — E876 Hypokalemia: Secondary | ICD-10-CM | POA: Diagnosis present

## 2016-05-29 DIAGNOSIS — J9601 Acute respiratory failure with hypoxia: Secondary | ICD-10-CM | POA: Diagnosis present

## 2016-05-29 DIAGNOSIS — Z8249 Family history of ischemic heart disease and other diseases of the circulatory system: Secondary | ICD-10-CM

## 2016-05-29 DIAGNOSIS — I2510 Atherosclerotic heart disease of native coronary artery without angina pectoris: Secondary | ICD-10-CM | POA: Diagnosis present

## 2016-05-29 DIAGNOSIS — Z7982 Long term (current) use of aspirin: Secondary | ICD-10-CM

## 2016-05-29 DIAGNOSIS — N400 Enlarged prostate without lower urinary tract symptoms: Secondary | ICD-10-CM | POA: Diagnosis present

## 2016-05-29 DIAGNOSIS — N320 Bladder-neck obstruction: Secondary | ICD-10-CM | POA: Diagnosis present

## 2016-05-29 DIAGNOSIS — Z79899 Other long term (current) drug therapy: Secondary | ICD-10-CM

## 2016-05-29 DIAGNOSIS — R739 Hyperglycemia, unspecified: Secondary | ICD-10-CM | POA: Diagnosis present

## 2016-05-29 DIAGNOSIS — B965 Pseudomonas (aeruginosa) (mallei) (pseudomallei) as the cause of diseases classified elsewhere: Secondary | ICD-10-CM | POA: Diagnosis present

## 2016-05-29 DIAGNOSIS — N179 Acute kidney failure, unspecified: Secondary | ICD-10-CM | POA: Diagnosis present

## 2016-05-29 DIAGNOSIS — I714 Abdominal aortic aneurysm, without rupture: Secondary | ICD-10-CM | POA: Diagnosis present

## 2016-05-29 DIAGNOSIS — R339 Retention of urine, unspecified: Secondary | ICD-10-CM | POA: Diagnosis present

## 2016-05-29 DIAGNOSIS — E785 Hyperlipidemia, unspecified: Secondary | ICD-10-CM | POA: Diagnosis present

## 2016-05-29 DIAGNOSIS — N183 Chronic kidney disease, stage 3 (moderate): Secondary | ICD-10-CM | POA: Diagnosis present

## 2016-05-29 DIAGNOSIS — G9341 Metabolic encephalopathy: Secondary | ICD-10-CM | POA: Diagnosis present

## 2016-05-29 DIAGNOSIS — J441 Chronic obstructive pulmonary disease with (acute) exacerbation: Secondary | ICD-10-CM | POA: Diagnosis present

## 2016-05-29 DIAGNOSIS — E871 Hypo-osmolality and hyponatremia: Secondary | ICD-10-CM | POA: Diagnosis present

## 2016-05-29 DIAGNOSIS — Z8781 Personal history of (healed) traumatic fracture: Secondary | ICD-10-CM

## 2016-05-29 DIAGNOSIS — I509 Heart failure, unspecified: Secondary | ICD-10-CM | POA: Diagnosis not present

## 2016-05-29 DIAGNOSIS — Z9889 Other specified postprocedural states: Secondary | ICD-10-CM

## 2016-05-29 DIAGNOSIS — I130 Hypertensive heart and chronic kidney disease with heart failure and stage 1 through stage 4 chronic kidney disease, or unspecified chronic kidney disease: Secondary | ICD-10-CM | POA: Diagnosis present

## 2016-05-29 LAB — I-STAT CG4 LACTIC ACID, ED
Lactic Acid, Venous: 2.77 mmol/L (ref 0.5–2.0)
Lactic Acid, Venous: 3.31 mmol/L (ref 0.5–2.0)

## 2016-05-29 LAB — I-STAT ARTERIAL BLOOD GAS, ED
ACID-BASE DEFICIT: 1 mmol/L (ref 0.0–2.0)
ACID-BASE EXCESS: 2 mmol/L (ref 0.0–2.0)
BICARBONATE: 22.7 meq/L (ref 20.0–24.0)
Bicarbonate: 24.3 mEq/L — ABNORMAL HIGH (ref 20.0–24.0)
O2 SAT: 98 %
O2 Saturation: 100 %
PCO2 ART: 40.2 mmHg (ref 35.0–45.0)
TCO2: 23 mmol/L (ref 0–100)
TCO2: 26 mmol/L (ref 0–100)
pCO2 arterial: 23.4 mmHg — ABNORMAL LOW (ref 35.0–45.0)
pH, Arterial: 7.595 — ABNORMAL HIGH (ref 7.350–7.450)
pH, Arterial: 7.39 (ref 7.350–7.450)
pO2, Arterial: 155 mmHg — ABNORMAL HIGH (ref 80.0–100.0)
pO2, Arterial: 97 mmHg (ref 80.0–100.0)

## 2016-05-29 LAB — CBG MONITORING, ED: GLUCOSE-CAPILLARY: 165 mg/dL — AB (ref 65–99)

## 2016-05-29 LAB — COMPREHENSIVE METABOLIC PANEL
ALBUMIN: 2.4 g/dL — AB (ref 3.5–5.0)
ALT: 19 U/L (ref 17–63)
ANION GAP: 17 — AB (ref 5–15)
AST: 20 U/L (ref 15–41)
Alkaline Phosphatase: 81 U/L (ref 38–126)
BUN: 139 mg/dL — ABNORMAL HIGH (ref 6–20)
CHLORIDE: 92 mmol/L — AB (ref 101–111)
CO2: 22 mmol/L (ref 22–32)
Calcium: 8.6 mg/dL — ABNORMAL LOW (ref 8.9–10.3)
Creatinine, Ser: 7.53 mg/dL — ABNORMAL HIGH (ref 0.61–1.24)
GFR calc non Af Amer: 6 mL/min — ABNORMAL LOW (ref >60)
GFR, EST AFRICAN AMERICAN: 7 mL/min — AB (ref >60)
GLUCOSE: 163 mg/dL — AB (ref 65–99)
Potassium: >7.5 mmol/L (ref 3.5–5.1)
SODIUM: 131 mmol/L — AB (ref 135–145)
Total Bilirubin: 0.4 mg/dL (ref 0.3–1.2)
Total Protein: 5.8 g/dL — ABNORMAL LOW (ref 6.5–8.1)

## 2016-05-29 LAB — I-STAT CHEM 8, ED
BUN: 131 mg/dL — AB (ref 6–20)
CALCIUM ION: 1.11 mmol/L — AB (ref 1.13–1.30)
CHLORIDE: 94 mmol/L — AB (ref 101–111)
CREATININE: 7.5 mg/dL — AB (ref 0.61–1.24)
Glucose, Bld: 164 mg/dL — ABNORMAL HIGH (ref 65–99)
HEMATOCRIT: 31 % — AB (ref 39.0–52.0)
Hemoglobin: 10.5 g/dL — ABNORMAL LOW (ref 13.0–17.0)
Potassium: 7 mmol/L (ref 3.5–5.1)
Sodium: 128 mmol/L — ABNORMAL LOW (ref 135–145)
TCO2: 26 mmol/L (ref 0–100)

## 2016-05-29 LAB — CBC WITH DIFFERENTIAL/PLATELET
BASOS ABS: 0 10*3/uL (ref 0.0–0.1)
Basophils Relative: 0 %
EOS ABS: 0 10*3/uL (ref 0.0–0.7)
Eosinophils Relative: 0 %
HCT: 30.7 % — ABNORMAL LOW (ref 39.0–52.0)
Hemoglobin: 10.1 g/dL — ABNORMAL LOW (ref 13.0–17.0)
LYMPHS ABS: 1.9 10*3/uL (ref 0.7–4.0)
LYMPHS PCT: 9 %
MCH: 28.4 pg (ref 26.0–34.0)
MCHC: 32.9 g/dL (ref 30.0–36.0)
MCV: 86.2 fL (ref 78.0–100.0)
MONOS PCT: 3 %
Monocytes Absolute: 0.6 10*3/uL (ref 0.1–1.0)
NEUTROS ABS: 18.7 10*3/uL — AB (ref 1.7–7.7)
Neutrophils Relative %: 88 %
PLATELETS: 294 10*3/uL (ref 150–400)
RBC: 3.56 MIL/uL — AB (ref 4.22–5.81)
RDW: 16.6 % — ABNORMAL HIGH (ref 11.5–15.5)
WBC MORPHOLOGY: INCREASED
WBC: 21.2 10*3/uL — AB (ref 4.0–10.5)

## 2016-05-29 LAB — URINE MICROSCOPIC-ADD ON

## 2016-05-29 LAB — D-DIMER, QUANTITATIVE (NOT AT ARMC): D DIMER QUANT: 14.33 ug{FEU}/mL — AB (ref 0.00–0.50)

## 2016-05-29 LAB — URINALYSIS, ROUTINE W REFLEX MICROSCOPIC
BILIRUBIN URINE: NEGATIVE
Glucose, UA: NEGATIVE mg/dL
KETONES UR: NEGATIVE mg/dL
Nitrite: NEGATIVE
Protein, ur: 100 mg/dL — AB
SPECIFIC GRAVITY, URINE: 1.013 (ref 1.005–1.030)
pH: 6.5 (ref 5.0–8.0)

## 2016-05-29 LAB — CBC
HEMATOCRIT: 27.5 % — AB (ref 39.0–52.0)
HEMOGLOBIN: 9 g/dL — AB (ref 13.0–17.0)
MCH: 27.5 pg (ref 26.0–34.0)
MCHC: 32.7 g/dL (ref 30.0–36.0)
MCV: 84.1 fL (ref 78.0–100.0)
Platelets: 281 10*3/uL (ref 150–400)
RBC: 3.27 MIL/uL — ABNORMAL LOW (ref 4.22–5.81)
RDW: 16.1 % — AB (ref 11.5–15.5)
WBC: 17.5 10*3/uL — ABNORMAL HIGH (ref 4.0–10.5)

## 2016-05-29 LAB — BRAIN NATRIURETIC PEPTIDE: B NATRIURETIC PEPTIDE 5: 132.8 pg/mL — AB (ref 0.0–100.0)

## 2016-05-29 LAB — CREATININE, URINE, RANDOM: CREATININE, URINE: 29.85 mg/dL

## 2016-05-29 LAB — I-STAT TROPONIN, ED: Troponin i, poc: 0.05 ng/mL (ref 0.00–0.08)

## 2016-05-29 LAB — SODIUM, URINE, RANDOM: Sodium, Ur: 89 mmol/L

## 2016-05-29 LAB — PROTIME-INR
INR: 1.3 (ref 0.00–1.49)
PROTHROMBIN TIME: 16.4 s — AB (ref 11.6–15.2)

## 2016-05-29 LAB — POTASSIUM: POTASSIUM: 7.3 mmol/L — AB (ref 3.5–5.1)

## 2016-05-29 MED ORDER — VANCOMYCIN HCL IN DEXTROSE 1-5 GM/200ML-% IV SOLN: 1000.0000 mg | Freq: Once | INTRAVENOUS | Status: DC

## 2016-05-29 MED ORDER — PIPERACILLIN-TAZOBACTAM IN DEX 2-0.25 GM/50ML IV SOLN
2.2500 g | Freq: Three times a day (TID) | INTRAVENOUS | Status: DC
  Filled 2016-05-29: qty 50

## 2016-05-29 MED ORDER — VANCOMYCIN HCL IN DEXTROSE 1-5 GM/200ML-% IV SOLN
1000.0000 mg | INTRAVENOUS | Status: DC
Start: 2016-05-31 — End: 2016-05-29

## 2016-05-29 MED ORDER — ENOXAPARIN SODIUM 30 MG/0.3ML ~~LOC~~ SOLN
30.0000 mg | SUBCUTANEOUS | Status: DC
  Administered 2016-05-29: 30 mg via SUBCUTANEOUS
  Filled 2016-05-29: qty 0.3

## 2016-05-29 MED ORDER — ARFORMOTEROL TARTRATE 15 MCG/2ML IN NEBU
15.0000 ug | INHALATION_SOLUTION | Freq: Two times a day (BID) | RESPIRATORY_TRACT | Status: DC
  Administered 2016-05-29 – 2016-06-03 (×10): 15 ug via RESPIRATORY_TRACT
  Filled 2016-05-29 (×11): qty 2

## 2016-05-29 MED ORDER — ALBUTEROL SULFATE (2.5 MG/3ML) 0.083% IN NEBU: 2.5000 mg | INHALATION_SOLUTION | RESPIRATORY_TRACT | Status: DC | PRN

## 2016-05-29 MED ORDER — METHYLPREDNISOLONE SODIUM SUCC 125 MG IJ SOLR
60.0000 mg | Freq: Two times a day (BID) | INTRAMUSCULAR | Status: DC
  Administered 2016-05-29: 60 mg via INTRAVENOUS
  Filled 2016-05-29: qty 2
  Filled 2016-05-29: qty 0.96
  Filled 2016-05-29: qty 2

## 2016-05-29 MED ORDER — SODIUM CHLORIDE 0.9 % IV SOLN
1.0000 g | Freq: Once | INTRAVENOUS | Status: AC
  Administered 2016-05-29: 1 g via INTRAVENOUS
  Filled 2016-05-29: qty 10

## 2016-05-29 MED ORDER — CIPROFLOXACIN HCL 500 MG PO TABS: 500.0000 mg | ORAL_TABLET | Freq: Two times a day (BID) | ORAL | Status: DC

## 2016-05-29 MED ORDER — SODIUM CHLORIDE 0.9 % IV SOLN: 250.0000 mL | INTRAVENOUS | Status: DC | PRN

## 2016-05-29 MED ORDER — DEXTROSE 50 % IV SOLN
1.0000 | Freq: Once | INTRAVENOUS | Status: AC
  Administered 2016-05-29: 50 mL via INTRAVENOUS

## 2016-05-29 MED ORDER — BUDESONIDE 0.5 MG/2ML IN SUSP
0.5000 mg | Freq: Two times a day (BID) | RESPIRATORY_TRACT | Status: DC
  Administered 2016-05-30 – 2016-06-03 (×9): 0.5 mg via RESPIRATORY_TRACT
  Filled 2016-05-29 (×10): qty 2

## 2016-05-29 MED ORDER — PIPERACILLIN-TAZOBACTAM 3.375 G IVPB 30 MIN
3.3750 g | Freq: Once | INTRAVENOUS | Status: AC
  Administered 2016-05-29: 3.375 g via INTRAVENOUS
  Filled 2016-05-29: qty 50

## 2016-05-29 MED ORDER — INSULIN ASPART 100 UNIT/ML ~~LOC~~ SOLN
0.0000 [IU] | Freq: Three times a day (TID) | SUBCUTANEOUS | Status: DC
  Administered 2016-05-30: 3 [IU] via SUBCUTANEOUS
  Administered 2016-05-30: 2 [IU] via SUBCUTANEOUS
  Administered 2016-05-30: 3 [IU] via SUBCUTANEOUS
  Administered 2016-05-31: 5 [IU] via SUBCUTANEOUS
  Administered 2016-05-31: 2 [IU] via SUBCUTANEOUS
  Administered 2016-05-31: 1 [IU] via SUBCUTANEOUS
  Administered 2016-06-01: 2 [IU] via SUBCUTANEOUS
  Administered 2016-06-01: 1 [IU] via SUBCUTANEOUS
  Administered 2016-06-01: 2 [IU] via SUBCUTANEOUS
  Administered 2016-06-02 (×3): 3 [IU] via SUBCUTANEOUS
  Administered 2016-06-03: 2 [IU] via SUBCUTANEOUS
  Administered 2016-06-03 (×2): 1 [IU] via SUBCUTANEOUS

## 2016-05-29 MED ORDER — VANCOMYCIN HCL 10 G IV SOLR
1500.0000 mg | Freq: Once | INTRAVENOUS | Status: AC
  Administered 2016-05-29: 1500 mg via INTRAVENOUS
  Filled 2016-05-29: qty 1500

## 2016-05-29 MED ORDER — SODIUM POLYSTYRENE SULFONATE 15 GM/60ML PO SUSP
30.0000 g | Freq: Once | ORAL | Status: DC
  Filled 2016-05-29: qty 120

## 2016-05-29 MED ORDER — INSULIN ASPART 100 UNIT/ML IV SOLN
10.0000 [IU] | Freq: Once | INTRAVENOUS | Status: AC
  Administered 2016-05-29: 10 [IU] via INTRAVENOUS
  Filled 2016-05-29: qty 1

## 2016-05-29 MED ORDER — DILTIAZEM HCL ER COATED BEADS 300 MG PO CP24
300.0000 mg | ORAL_CAPSULE | Freq: Every day | ORAL | Status: DC
  Administered 2016-05-30 – 2016-06-03 (×5): 300 mg via ORAL
  Filled 2016-05-29 (×5): qty 1

## 2016-05-29 MED ORDER — SODIUM POLYSTYRENE SULFONATE 15 GM/60ML PO SUSP
60.0000 g | Freq: Once | ORAL | Status: AC
  Administered 2016-05-30: 60 g via RECTAL
  Filled 2016-05-29: qty 240

## 2016-05-29 MED ORDER — DEXTROSE 5 % IV SOLN: 2.0000 g | Freq: Once | INTRAVENOUS | Status: DC

## 2016-05-29 MED ORDER — IPRATROPIUM-ALBUTEROL 0.5-2.5 (3) MG/3ML IN SOLN
3.0000 mL | Freq: Four times a day (QID) | RESPIRATORY_TRACT | Status: DC
  Administered 2016-05-30 – 2016-05-31 (×6): 3 mL via RESPIRATORY_TRACT
  Filled 2016-05-29 (×6): qty 3

## 2016-05-29 MED ORDER — DEXTROSE 50 % IV SOLN
1.0000 | Freq: Once | INTRAVENOUS | Status: AC
  Administered 2016-05-29: 50 mL via INTRAVENOUS
  Filled 2016-05-29: qty 50

## 2016-05-29 MED ORDER — NITROGLYCERIN IN D5W 200-5 MCG/ML-% IV SOLN
0.0000 ug/min | Freq: Once | INTRAVENOUS | Status: AC
  Administered 2016-05-29: 10 ug/min via INTRAVENOUS
  Filled 2016-05-29: qty 250

## 2016-05-29 MED ORDER — FUROSEMIDE 40 MG PO TABS: 80.0000 mg | ORAL_TABLET | Freq: Two times a day (BID) | ORAL | Status: DC

## 2016-05-29 MED ORDER — SODIUM CHLORIDE 0.9 % IV SOLN
INTRAVENOUS | Status: AC
  Administered 2016-05-29 – 2016-05-30 (×2): via INTRAVENOUS

## 2016-05-29 NOTE — ED Notes (Signed)
Pt here from [facility] with sob starting this morning. Pt is recovering from left hip fracture. EMS reports absent breath sounds bilaterally. EMS administered 125 solumedrol and a duoneb PTA. Recent labs from facility reveal K 7.2. Pt arrives on cpap, transferred to bipap on arrival.

## 2016-05-29 NOTE — ED Notes (Signed)
MD Jacubowitz  aware of critical K.

## 2016-05-29 NOTE — Progress Notes (Signed)
Placed patient on BiPAP per order patient tolerated well. 

## 2016-05-29 NOTE — Consult Note (Addendum)
Reason for Consult: Acute renal failure on chronic kidney disease stage III Referring Physician: Doug SouSam Jacubowitz M.D. (ER physician)  HPI:  82 year old Caucasian man with past medical history significant for baseline chronic kidney disease stage III (creatinine ranging 1.1-1.4) who was just discharged from the hospital after an admission for left hip fracture complicated by acute on chronic renal failure that was determined to be hemodynamically mediated and improved with intravenous fluids/conservative measures (seen by Dr. Darrick Pennaeterding). He has underlying history of hypertension, coronary artery disease status post CABG in 1998, AAA with stent, COPD/pulmonary fibrosis on chronic oxygen therapy and recent left hip fracture status post open reduction and internal fixation with intramedullary rod. He was discharged with a creatinine of 1.6/stable electrolytes/improving urine output and on furosemide/potassium to [facility] for short-term rehabilitation. He was sent to the emergency room with concerns of feeling poorly, decreased appetite/nausea and denied any fevers or chills. In the emergency room, found to have acute on chronic renal failure with a creatinine of 7.5 and a potassium of 7.3 with EKG changes. Arterial blood gas showed pH 7.39, PCO2 40, pO2 97 and serum bicarbonate 22  The patient is not able to answer an extensive review of systems but reports some nausea and poor oral intake for the past 2 days with difficulty urinating and questionable hematuria versus dark urine. Prior to admission, he was taking furosemide 80 mg twice a day and K-Dur 20 milliequivalents twice a day. Not taking an NSAID. Earlier today started on ciprofloxacin 500 mg twice a day for leukocytosis.   Past Medical History  Diagnosis Date  . Hyperlipidemia   . Hypertension   . Abdominal aortic aneurysm (HCC)     with endoleak per Dr. Janetta HoraVarnell, aneurysmal sac size stable as of 12/11 (7.5 x 6.1cm)  . Benign prostatic  hypertrophy   . History of chickenpox   . Dysuria   . Heart disease   . Gout   . Sigmoid diverticulosis   . Melanoma in situ [facility](HCC)     Per [facility] 2014  . MI (myocardial infarction) (HCC)   . Coronary artery disease   . Pulmonary fibrosis (HCC)   . CHF (congestive heart failure) (HCC)   . Emphysema lung [facility](HCC)     Past Surgical History  Procedure Laterality Date  . Hernia repair  2011    Right  . Coronary artery bypass graft  1991  . Bilateral cataract surgery  2011  . Aaa stent  2008  . Left cea  1997  . Intramedullary (im) nail intertrochanteric Left 05/18/2016    Procedure: INTRAMEDULLARY (IM) NAIL INTERTROCHANTRIC;  Surgeon: Jodi GeraldsJohn Graves, MD;  Location: [facility] OR;  Service: Orthopedics;  Laterality: Left;    Family History  Problem Relation Age of Onset  . Stroke Mother   . Heart disease Father [id number]    Sudden death  . Heart attack Father     Social History:  reports that he quit smoking about 27 years ago. His smoking use included Cigarettes. He has a 40 pack-year smoking history. He has never used smokeless tobacco. He reports that he drinks alcohol. He reports that he does not use illicit drugs.  Allergies: No Known Allergies  Medications:  Scheduled: . sodium polystyrene  30 g Oral Once    BMP Latest Ref Rng 05/29/2016 05/29/2016 05/29/2016  Glucose 65 - 99 mg/dL 161(W164(H) - 960(A163(H)  BUN 6 - 20 mg/dL 540(J131(H) - 811(B139(H)  Creatinine 0.61 - 1.24 mg/dL 1.47(W7.50(H) - 2.95(A7.53(H)  Sodium  135 - 145 mmol/L 128(L) - 131(L)  Potassium 3.5 - 5.1 mmol/L 7.0(HH) 7.3(HH) >7.5(HH)  Chloride 101 - 111 mmol/L 94(L) - 92(L)  CO2 22 - 32 mmol/L - - 22  Calcium 8.9 - 10.3 mg/dL - - 8.6(L)   CBC Latest Ref Rng 05/29/2016 05/29/2016 05/23/2016  WBC 4.0 - 10.5 K/uL - 21.2(H) 9.8  Hemoglobin 13.0 - 17.0 g/dL 10.5(L) 10.1(L) 8.1(L)  Hematocrit 39.0 - 52.0 % 31.0(L) 30.7(L) 25.5(L)  Platelets 150 - 400 K/uL - 294 174    Dg Chest Portable 1 View  05/29/2016  CLINICAL DATA:  82 year old male  with shortness of breath. EXAM: PORTABLE CHEST 1 VIEW COMPARISON:  Chest radiograph dated 05/27 FINDINGS: Single-view of the chest demonstrates emphysematous changes of the lungs with bibasilar linear atelectasis/scarring. There is no focal consolidation, pleural effusion, or pneumothorax. The cardiac silhouette is within normal limits. Median sternotomy wires and bypass ring is noted. There is atherosclerotic calcification of the aortic arch. Degenerative changes of the spine and shoulders. Old healed right posterior rib fractures noted. No acute fracture. IMPRESSION: No active disease. Electronically Signed   By: Elgie Collard M.D.   On: 05/29/2016 18:05    Review of Systems  Unable to perform ROS: acuity of condition   Blood pressure 175/66, pulse 69, temperature 98.3 F (36.8 C), temperature source Rectal, resp. rate 23, SpO2 100 %. Physical Exam  Nursing note and vitals reviewed. Constitutional: He appears well-developed.  Appears uncomfortable with oxygen via   HENT:  Head: Normocephalic and atraumatic.  Dry oropharynx/oral mucosa  Eyes: Conjunctivae and EOM are normal. Pupils are equal, round, and reactive to light. No scleral icterus.  Neck: Neck supple. No JVD present.  Cardiovascular: Normal rate and regular rhythm.   Respiratory: Effort normal. No respiratory distress. He has wheezes. He has no rales.  GI: Soft. He exhibits distension. There is tenderness.  Tympanitic with tenderness over suprapubic area/RLQ  Musculoskeletal: He exhibits edema.  Neurological:  Somnolent but awakens with calling out his name-appears oriented to time and place  Skin: Skin is warm.  Clean dressing over her left hip surgical site as well as left lateral thigh    Assessment/Plan: 1. Acute renal failure on chronic kidney disease stage III: Underlying chronic kidney disease likely hypertensive versus age-related nephrosclerosis with acute renal failure that appears to be clinically from volume  depletion with need to rule out obstruction with renal ultrasound and placement of a catheter. Would favor intravenous fluid therapy at this time for volume expansion along with urinalysis/urine electrolyte measurements. Await repeat labs to assess need for emergent hemodialysis. Will empirically check an ANCA and complement levels. No evidence of MAHA.  2. Hyperkalemia: Secondary to acute renal failure, status post temporizing measures in the emergency room. Awaiting renal ultrasound to assess for possible obstruction-start isotonic intravenous fluids and Kayexalate therapy (may need rectal Kayexalate rather than oral with current mental status). With peaked T waves but normal QRS. 3. Hyponatremia: This appears to be secondary to acute renal failure/free water excretion defect. Await renal ultrasound and institute isotonic intravenous fluid resuscitation. 4. Sepsis: Noted to have significant leukocytosis of 21,000 with focus of infection-surgical site appears to be clean and dry without any unusual erythema or tenderness. On empiric vancomycin/Zosyn.  Kariya Lavergne K. 05/29/2016, 9:13 PM

## 2016-05-29 NOTE — Progress Notes (Signed)
RT has removed BIPAP from patient and placed on 4L [place] per MD verbal order and ABG results. Patient is tolerating well. Patient in no distress at this time.

## 2016-05-29 NOTE — ED Notes (Signed)
US at bedside

## 2016-05-29 NOTE — ED Provider Notes (Signed)
Seen on arrival. Low 5 caveat acute situation patient dyspneic. Patient found at skilled nursing facility to be dyspneic today. EMS was called. EMS treated patient with BiPAP and Solu-Medrol intravenously. On exam patient is chronically and acutely ill-appearing. Moderate to severe rest for distress. On BiPAP. HEENT exam extremities dry. Neck supple positive JVD lungs diminished breath sounds throughout abdomen morbidly obese. Genitalia scrotum with time sized scabbed lesion. Uncircumcised. No edema. Bilateral lower extremities with 3+ pretibial pitting edema. CODE sepsis Based on Sirs criteria tachypnea, lactic acidosis, leukocytosis. Patient noted to be markedly hyperkalemic. With renal failure. Calcium gluconate, D50, insulin ordered. Chest x-ray viewed by me. Arterial blood gas consistent with respiratory alkalosis  6:50 PM patient remains alert, follows simple commands, moves all extremities Results for orders placed or performed during the hospital encounter of 05/29/16  CBC with Differential  Result Value Ref Range   WBC 21.2 (H) 4.0 - 10.5 K/uL   RBC 3.56 (L) 4.22 - 5.81 MIL/uL   Hemoglobin 10.1 (L) 13.0 - 17.0 g/dL   HCT 16.1 (L) 09.6 - 04.5 %   MCV 86.2 78.0 - 100.0 fL   MCH 28.4 26.0 - 34.0 pg   MCHC 32.9 30.0 - 36.0 g/dL   RDW 40.9 (H) 81.1 - 91.4 %   Platelets 294 150 - 400 K/uL   Neutrophils Relative % 88 %   Lymphocytes Relative 9 %   Monocytes Relative 3 %   Eosinophils Relative 0 %   Basophils Relative 0 %   Neutro Abs 18.7 (H) 1.7 - 7.7 K/uL   Lymphs Abs 1.9 0.7 - 4.0 K/uL   Monocytes Absolute 0.6 0.1 - 1.0 K/uL   Eosinophils Absolute 0.0 0.0 - 0.7 K/uL   Basophils Absolute 0.0 0.0 - 0.1 K/uL   RBC Morphology ELLIPTOCYTES    WBC Morphology INCREASED BANDS (>20% BANDS)   Comprehensive metabolic panel  Result Value Ref Range   Sodium 131 (L) 135 - 145 mmol/L   Potassium >7.5 (HH) 3.5 - 5.1 mmol/L   Chloride 92 (L) 101 - 111 mmol/L   CO2 22 22 - 32 mmol/L   Glucose, Bld  163 (H) 65 - 99 mg/dL   BUN 782 (H) 6 - 20 mg/dL   Creatinine, Ser 9.56 (H) 0.61 - 1.24 mg/dL   Calcium 8.6 (L) 8.9 - 10.3 mg/dL   Total Protein 5.8 (L) 6.5 - 8.1 g/dL   Albumin 2.4 (L) 3.5 - 5.0 g/dL   AST 20 15 - 41 U/L   ALT 19 17 - 63 U/L   Alkaline Phosphatase 81 38 - 126 U/L   Total Bilirubin 0.4 0.3 - 1.2 mg/dL   GFR calc non Af Amer 6 (L) >60 mL/min   GFR calc Af Amer 7 (L) >60 mL/min   Anion gap 17 (H) 5 - 15  Protime-INR  Result Value Ref Range   Prothrombin Time 16.4 (H) 11.6 - 15.2 seconds   INR 1.30 0.00 - 1.49  I-Stat CG4 Lactic Acid, ED  Result Value Ref Range   Lactic Acid, Venous 2.77 (HH) 0.5 - 2.0 mmol/L   Comment NOTIFIED PHYSICIAN   CBG monitoring, ED  Result Value Ref Range   Glucose-Capillary 165 (H) 65 - 99 mg/dL   Comment 1 Notify RN    Comment 2 Document in Chart   I-Stat arterial blood gas, ED  Result Value Ref Range   pH, Arterial 7.595 (H) 7.350 - 7.450   pCO2 arterial 23.4 (L) 35.0 - 45.0 mmHg  pO2, Arterial 155.0 (H) 80.0 - 100.0 mmHg   Bicarbonate 22.7 20.0 - 24.0 mEq/L   TCO2 23 0 - 100 mmol/L   O2 Saturation 100.0 %   Acid-Base Excess 2.0 0.0 - 2.0 mmol/L   Patient temperature 98.6 F    Collection site RADIAL, ALLEN'S TEST ACCEPTABLE    Drawn by Operator    Sample type ARTERIAL   I-stat troponin, ED  Result Value Ref Range   Troponin i, poc 0.05 0.00 - 0.08 ng/mL   Comment 3           Dg Chest 1 View  05/18/2016  CLINICAL DATA:  Dizziness after getting out of bed and falling. EXAM: CHEST 1 VIEW COMPARISON:  09/08/2015. FINDINGS: Normal sized heart. Small amount of linear density at the left lung base. Stable prominence of the interstitial markings, hyper expansion of the lungs and diffuse peribronchial thickening. Diffuse osteopenia. Old, healed right humeral neck fracture. Post CABG changes. IMPRESSION: 1. Minimal linear atelectasis or scarring at the left lung base. 2. Stable changes of COPD and chronic bronchitis. Electronically  Signed   By: Beckie Salts M.D.   On: 05/18/2016 11:53   Ct Head Wo Contrast  05/18/2016  CLINICAL DATA:  Status post fall, left hip pain EXAM: CT HEAD WITHOUT CONTRAST TECHNIQUE: Contiguous axial images were obtained from the base of the skull through the vertex without intravenous contrast. COMPARISON:  None. FINDINGS: There is no evidence of mass effect, midline shift, or extra-axial fluid collections. There is no evidence of a space-occupying lesion or intracranial hemorrhage. There is no evidence of a cortical-based area of acute infarction. 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 There is generalized cerebral atrophy. There is periventricular white matter low attenuation likely secondary to microangiopathy. The ventricles and sulci are appropriate for the patient's age. The basal cisterns are patent. Visualized portions of the orbits are unremarkable. There is right maxillary sinus mucosal thickening. Cerebrovascular atherosclerotic calcifications are noted. The osseous structures are unremarkable. IMPRESSION: No acute intracranial pathology. Electronically Signed   By: Elige Ko   On: 05/18/2016 12:16   US Renal  05/20/2016  CLINICAL DATA:  82 year old hypertensive male with acute renal failure. Initial encounter. EXAM: RENAL / URINARY TRACT ULTRASOUND COMPLETE COMPARISON:  None. FINDINGS: Right Kidney: Length: 10.2 cm. Mild renal parenchymal thinning. Echogenic renal parenchyma. Findings may indicate changes of medical renal disease. No hydronephrosis or mass noted. Left Kidney: Length: 10.7 cm. 7.1 x 3 x 5.6 cm bilobed cyst mid to upper aspect of the left kidney. Mild renal parenchymal thinning. Echogenic renal parenchyma. Findings may indicate changes of medical renal disease. Bladder: Foley catheter in place and bladder decompressed with evaluation therefore limited. IMPRESSION: No hydronephrosis. Mild bilateral renal parenchymal thinning and increased parenchymal  echogenicity. Findings may indicate changes of medical renal disease. Large bilobed left renal cyst measuring up to 7.1 cm. Electronically Signed   By: Lacy Duverney M.D.   On: 05/20/2016 15:46   Dg Chest Portable 1 View  05/29/2016  CLINICAL DATA:  82 year old male with shortness of breath. EXAM: PORTABLE CHEST 1 VIEW COMPARISON:  Chest radiograph dated 05/27 FINDINGS: Single-view of the chest demonstrates emphysematous changes of the lungs with bibasilar linear atelectasis/scarring. There is no focal consolidation, pleural effusion, or pneumothorax. The cardiac silhouette is within normal limits. Median sternotomy wires and bypass ring is noted. There is atherosclerotic calcification of the aortic arch. Degenerative changes of the spine and shoulders. Old healed right posterior rib  fractures noted. No acute fracture. IMPRESSION: No active disease. Electronically Signed   By: Elgie CollardArash  Radparvar M.D.   On: 05/29/2016 18:05   Dg C-arm 61-120 Min  05/18/2016  CLINICAL DATA:  Patient status post ORIF left femur fracture. EXAM: DG C-ARM 61-120 MIN; LEFT FEMUR 2 VIEWS COMPARISON:  Hip radiograph 05/18/2016. FINDINGS: Patient status post intra medullary rod and screw fixation of comminuted intratrochanteric left femur fracture. Improved anatomic alignment at the fracture site. Four intraoperative fluoroscopic images were submitted. IMPRESSION: Patient status post intra medullary rod fixation comminuted proximal left femur fracture. Electronically Signed   By: Annia Beltrew  Davis M.D.   On: 05/18/2016 18:47   Dg Hip Unilat With Pelvis 2-3 Views Left  05/18/2016  CLINICAL DATA:  Left hip pain after or getting out of bed and falling this morning. EXAM: DG HIP (WITH OR WITHOUT PELVIS) 2-3V LEFT COMPARISON:  None. FINDINGS: Comminuted left intertrochanteric fracture with varus angulation. Diffuse osteopenia. Bilateral iliac artery stents. Atheromatous arterial calcifications. IMPRESSION: Comminuted left intertrochanteric  fracture with varus angulation. Electronically Signed   By: Beckie SaltsSteven  Reid M.D.   On: 05/18/2016 11:55   Dg Femur Min 2 Views Left  05/18/2016  CLINICAL DATA:  Patient status post ORIF left femur fracture. EXAM: DG C-ARM 61-120 MIN; LEFT FEMUR 2 VIEWS COMPARISON:  Hip radiograph 05/18/2016. FINDINGS: Patient status post intra medullary rod and screw fixation of comminuted intratrochanteric left femur fracture. Improved anatomic alignment at the fracture site. Four intraoperative fluoroscopic images were submitted. IMPRESSION: Patient status post intra medullary rod fixation comminuted proximal left femur fracture. Electronically Signed   By: Annia Beltrew  Davis M.D.   On: 05/18/2016 18:47      Patient likely requires emergent hemodialysis. Creatinine 6 days ago was 1.66. He has new onset renal failure. Patient was initially started on intravenous nitroglycerin as he was hypotensive peripheral edema felt to be in pulmonary edema however no edema seen on chest x-ray. Nitroglycerin drip can be discontinued CRITICAL CARE Performed by: Doug SouJACUBOWITZ,Melodye Swor Total critical care time: 30 minutes Critical care time was exclusive of separately billable procedures and treating other patients. Critical care was necessary to treat or prevent imminent or life-threatening deterioration. Critical care was time spent personally by me on the following activities: development of treatment plan with patient and/or surrogate as well as nursing, discussions with consultants, evaluation of patient's response to treatment, examination of patient, obtaining history from patient or surrogate, ordering and performing treatments and interventions, ordering and review of laboratory studies, ordering and review of radiographic studies, pulse oximetry and re-evaluation of patient's condition.  Doug SouSam Montavius Subramaniam, MD 05/29/16 2218

## 2016-05-29 NOTE — ED Notes (Signed)
[id number] 

## 2016-05-29 NOTE — ED Provider Notes (Signed)
CSN: [id number]     Arrival date & time 05/29/16  1718 History   First MD Initiated Contact with Patient 05/29/16 1728     Chief Complaint  Patient presents with  . Respiratory Distress   Patient is a 82 y.o. male presenting with shortness of breath.  Shortness of Breath Severity:  Severe Onset quality:  Gradual Timing:  Constant Progression:  Worsening Chronicity:  New Context: activity   Relieved by: bipap. Worsened by:  Exertion Associated symptoms: no chest pain, no cough and no fever   Risk factors: prolonged immobilization and recent surgery    Limited history secondary to acuity of condition. Attempted to contact nursing home but unable to reach them 3  Past Medical History  Diagnosis Date  . Hyperlipidemia   . Hypertension   . Abdominal aortic aneurysm (HCC)     with endoleak per Dr. Janetta Hora, aneurysmal sac size stable as of 12/11 (7.5 x 6.1cm)  . Benign prostatic hypertrophy   . History of chickenpox   . Dysuria   . Heart disease   . Gout   . Sigmoid diverticulosis   . Melanoma in situ [facility])     Per [facility] 2014  . MI (myocardial infarction) (HCC)   . Coronary artery disease   . Pulmonary fibrosis (HCC)   . CHF (congestive heart failure) (HCC)   . Emphysema lung [facility])    Past Surgical History  Procedure Laterality Date  . Hernia repair  2011    Right  . Coronary artery bypass graft  1991  . Bilateral cataract surgery  2011  . Aaa stent  2008  . Left cea  1997  . Intramedullary (im) nail intertrochanteric Left 05/18/2016    Procedure: INTRAMEDULLARY (IM) NAIL INTERTROCHANTRIC;  Surgeon: Jodi Geralds, MD;  Location: [facility] OR;  Service: Orthopedics;  Laterality: Left;   Family History  Problem Relation Age of Onset  . Stroke Mother   . Heart disease Father 79    Sudden death  . Heart attack Father    Social History  Substance Use Topics  . Smoking status: Former Smoker -- 1.00 packs/day for 40 years    Types: Cigarettes    Quit  date: 12/23/1988  . Smokeless tobacco: Never Used  . Alcohol Use: 0.0 oz/week    0 Standard drinks or equivalent per week     Comment: WINE 1-2 TIMES WEEKLY AND BEER OCCASSIONALLY    Review of Systems  Unable to perform ROS: Acuity of condition  Constitutional: Negative for fever.  Respiratory: Positive for shortness of breath. Negative for cough.   Cardiovascular: Negative for chest pain.  Allergic/Immunologic: Negative for immunocompromised state.      Allergies  Review of patient's allergies indicates no known allergies.  Home Medications   Prior to Admission medications   Medication Sig Start Date End Date Taking? Authorizing Provider  ascorbic acid (VITAMIN C) 1000 MG tablet Take 1,000 mg by mouth daily.   Yes Historical Provider, MD  atorvastatin (LIPITOR) 20 MG tablet Take 1 tablet (20 mg total) by mouth daily at 6 PM. 05/13/12  Yes Zannie Cove, MD  ciprofloxacin (CIPRO) 500 MG tablet Take 1 tablet (500 mg total) by mouth 2 (two) times daily. 05/29/16  Yes Dinah C Ngetich, NP  clotrimazole-betamethasone (LOTRISONE) cream Apply 1 application topically 2 (two) times daily as needed. For itching from proteinuria   Yes Historical Provider, MD  docusate sodium (COLACE) 100 MG capsule Take 200 mg by mouth daily.  Yes Historical Provider, MD  furosemide (LASIX) 40 MG tablet Take 2 tablets (80 mg total) by mouth 2 (two) times daily. Give first dose now. 05/29/16  Yes Dinah C Ngetich, NP  HYDROcodone-acetaminophen (NORCO) 5-325 MG tablet Take 1 tablet by mouth every 6 (six) hours as needed for moderate pain. 05/23/16  Yes Costin Otelia Sergeant, MD  potassium chloride SA (K-DUR,KLOR-CON) 20 MEQ tablet Take 20 mEq by mouth 2 (two) times daily. Take with Lasix   Yes Historical Provider, MD  saccharomyces boulardii (FLORASTOR) 250 MG capsule Take 250 mg by mouth 2 (two) times daily. Start date 05-29-16 per [facility], For 10 days   Yes Historical Provider, MD  vitamin E 1000 UNIT capsule Take 1,000 Units  by mouth daily.   Yes Historical Provider, MD  allopurinol (ZYLOPRIM) 300 MG tablet Take 300 mg by mouth daily. Reported on 05/29/2016    Historical Provider, MD  aspirin 325 MG EC tablet Take 325 mg by mouth 2 (two) times daily. Reported on 05/29/2016 05/23/16 06/23/16  Historical Provider, MD  Cholecalciferol (VITAMIN D3 PO) Take 1,000 Units by mouth daily. Reported on 05/29/2016    Historical Provider, MD  diltiazem (TIAZAC) 300 MG 24 hr capsule Take 300 mg by mouth daily. Reported on 05/29/2016    Historical Provider, MD  doxazosin (CARDURA) 8 MG tablet Take 8 mg by mouth daily. Reported on 05/29/2016    Historical Provider, MD  dutasteride (AVODART) 0.5 MG capsule Take 0.5 mg by mouth at bedtime. Reported on 05/29/2016    Historical Provider, MD   BP 163/57 mmHg  Pulse 71  Temp(Src) 98.5 F (36.9 C) (Axillary)  Resp 22  SpO2 100% Physical Exam  Constitutional: He appears well-developed and well-nourished. No distress.  HENT:  Head: Normocephalic and atraumatic.  Left Ear: External ear normal.  Eyes: Conjunctivae are normal. Pupils are equal, round, and reactive to light. Right eye exhibits no discharge. Left eye exhibits no discharge.  Neck: Normal range of motion. Neck supple.  Cardiovascular: Normal rate and regular rhythm.   No murmur heard. Pulmonary/Chest: He is in respiratory distress. He has no wheezes. He has rales.  Tachypneic, bilateral Rales  Abdominal: Soft. Bowel sounds are normal. He exhibits no distension and no mass. There is no tenderness. There is no rebound and no guarding.  Genitourinary:  Abrasion along testicle, no sacral decubitus ulcer  Musculoskeletal: He exhibits edema (diffuse edema, anasarca).  Lymphadenopathy:    He has no cervical adenopathy.  Neurological:  Follows commands but drowsy  Skin: Skin is warm. He is not diaphoretic.  Psychiatric: He has a normal mood and affect.    ED Course  Procedures (including critical care time) Labs Review Labs Reviewed   CBC WITH DIFFERENTIAL/PLATELET - Abnormal; Notable for the following:    WBC 21.2 (*)    RBC 3.56 (*)    Hemoglobin 10.1 (*)    HCT 30.7 (*)    RDW 16.6 (*)    Neutro Abs 18.7 (*)    All other components within normal limits  COMPREHENSIVE METABOLIC PANEL - Abnormal; Notable for the following:    Sodium 131 (*)    Potassium >7.5 (*)    Chloride 92 (*)    Glucose, Bld 163 (*)    BUN 139 (*)    Creatinine, Ser 7.53 (*)    Calcium 8.6 (*)    Total Protein 5.8 (*)    Albumin 2.4 (*)    GFR calc non Af Amer 6 (*)  GFR calc Af Amer 7 (*)    Anion gap 17 (*)    All other components within normal limits  BRAIN NATRIURETIC PEPTIDE - Abnormal; Notable for the following:    B Natriuretic Peptide 132.8 (*)    All other components within normal limits  PROTIME-INR - Abnormal; Notable for the following:    Prothrombin Time 16.4 (*)    All other components within normal limits  POTASSIUM - Abnormal; Notable for the following:    Potassium 7.3 (*)    All other components within normal limits  D-DIMER, QUANTITATIVE (NOT AT [facility]) - Abnormal; Notable for the following:    D-Dimer, Quant 14.33 (*)    All other components within normal limits  URINALYSIS, ROUTINE W REFLEX MICROSCOPIC (NOT AT [facility]) - Abnormal; Notable for the following:    APPearance TURBID (*)    Hgb urine dipstick MODERATE (*)    Protein, ur 100 (*)    Leukocytes, UA LARGE (*)    All other components within normal limits  URINE MICROSCOPIC-ADD ON - Abnormal; Notable for the following:    Squamous Epithelial / LPF 0-5 (*)    Bacteria, UA RARE (*)    All other components within normal limits  CBC - Abnormal; Notable for the following:    WBC 17.5 (*)    RBC 3.27 (*)    Hemoglobin 9.0 (*)    HCT 27.5 (*)    RDW 16.1 (*)    All other components within normal limits  GLUCOSE, CAPILLARY - Abnormal; Notable for the following:    Glucose-Capillary 185 (*)    All other components within normal limits  GLUCOSE, CAPILLARY  - Abnormal; Notable for the following:    Glucose-Capillary 182 (*)    All other components within normal limits  I-STAT CG4 LACTIC ACID, ED - Abnormal; Notable for the following:    Lactic Acid, Venous 2.77 (*)    All other components within normal limits  CBG MONITORING, ED - Abnormal; Notable for the following:    Glucose-Capillary 165 (*)    All other components within normal limits  I-STAT ARTERIAL BLOOD GAS, ED - Abnormal; Notable for the following:    pH, Arterial 7.595 (*)    pCO2 arterial 23.4 (*)    pO2, Arterial 155.0 (*)    All other components within normal limits  I-STAT ARTERIAL BLOOD GAS, ED - Abnormal; Notable for the following:    Bicarbonate 24.3 (*)    All other components within normal limits  I-STAT CG4 LACTIC ACID, ED - Abnormal; Notable for the following:    Lactic Acid, Venous 3.31 (*)    All other components within normal limits  I-STAT CHEM 8, ED - Abnormal; Notable for the following:    Sodium 128 (*)    Potassium 7.0 (*)    Chloride 94 (*)    BUN 131 (*)    Creatinine, Ser 7.50 (*)    Glucose, Bld 164 (*)    Calcium, Ion 1.11 (*)    Hemoglobin 10.5 (*)    HCT 31.0 (*)    All other components within normal limits  URINE CULTURE  MRSA PCR SCREENING  CREATININE, URINE, RANDOM  SODIUM, URINE, RANDOM  UREA NITROGEN, URINE  BASIC METABOLIC PANEL  RENAL FUNCTION PANEL  MPO/PR-3 (ANCA) ANTIBODIES  C3 COMPLEMENT  C4 COMPLEMENT  BASIC METABOLIC PANEL  MAGNESIUM  PHOSPHORUS  CBC  LACTIC ACID, PLASMA  HEMOGLOBIN A1C  I-STAT TROPOININ, ED    Imaging Review [place]  Renal  05/29/2016  CLINICAL DATA:  Acute kidney injury. EXAM: RENAL / URINARY TRACT ULTRASOUND COMPLETE COMPARISON:  05/20/2016 FINDINGS: Right Kidney: Length: 10.8 cm (stable). Stable echogenic appearance of the renal cortex and mild atrophy. No hydronephrosis. Left Kidney: Length: 11.4 cm (stable). Stable echogenic renal cortex with mild atrophy. No hydronephrosis. Stable septated renal cyst  measuring approximately 6 cm in greatest dimensions. Bladder: Distended bladder contains a mild amount of dependent debris. IMPRESSION: Stable bilateral echogenic kidneys with mild atrophy. Findings are suggestive of chronic kidney disease. No evidence of hydronephrosis. A distended bladder contains a mild amount of dependent debris. Electronically Signed   By: Irish Lack M.D.   On: 05/29/2016 21:58   Dg Chest Portable 1 View  05/29/2016  CLINICAL DATA:  82 year old male with shortness of breath. EXAM: PORTABLE CHEST 1 VIEW COMPARISON:  Chest radiograph dated 05/27 FINDINGS: Single-view of the chest demonstrates emphysematous changes of the lungs with bibasilar linear atelectasis/scarring. There is no focal consolidation, pleural effusion, or pneumothorax. The cardiac silhouette is within normal limits. Median sternotomy wires and bypass ring is noted. There is atherosclerotic calcification of the aortic arch. Degenerative changes of the spine and shoulders. Old healed right posterior rib fractures noted. No acute fracture. IMPRESSION: No active disease. Electronically Signed   By: Elgie Collard M.D.   On: 05/29/2016 18:05   I have personally reviewed and evaluated these images and lab results as part of my medical decision-making.   EKG Interpretation   Date/Time:  Wednesday May 29 2016 17:27:33 EDT Ventricular Rate:  149 PR Interval:    QRS Duration: 153 QT Interval:  473 QTC Calculation: 745 R Axis:   89 Text Interpretation:  nsr  with wide complex, no further rhythm analysis  attempted Left bundle branch block New since previous tracing Confirmed by  Ethelda Chick  MD, SAM [phone]) on 05/29/2016 6:58:06 PM      MDM   Final diagnoses:  AKI (acute kidney injury) (HCC)  Hyperkalemia  Acute respiratory failure with hypoxia (HCC)  Sepsis, due to unspecified organism (HCC)    New-onset of acute kidney injury with no obvious trigger. Renal ultrasound without obvious obstruction.  Patient highly hyperkalemic and given calcium gluconate, insulin, dextrose. Repeat potassium continues elevated and given a second dose of insulin and dextrose and Kayexalate. Patient had QTc prolongued on EKG but blood pressure has maintained. Patient does not have any chest pain or shortness of breath and initial troponin is negative. High leukocytosis, tachypnea. Code sepsis called. Vanc/Zosyn administered. Discussed this with neurology who anticipates dialysis. Patient weaned off the BiPAP after a short course of nitroglycerin. Patient now on nasal cannula. Discussed case with the intensivist. Family updated at the bedside. Patient will be admitted.   Sidney Ace, MD 05/30/16 6045  Doug Sou, MD 05/30/16 0120

## 2016-05-29 NOTE — Progress Notes (Signed)
Pharmacy Antibiotic Note  Dustin [name] is a 82 y.o. male admitted on 05/29/2016 with SOB and sepsis.  Pharmacy has been consulted for Zosyn and vancomycin dosing.  Day #1 of abx for sepsis. Afebrile, WBC elevated at 21.2. SCr elevated at 7.53 (had recent AKI) CrCl ~5-6910ml/min.  Plan: Give Zosyn 3.375g IV (30 min infusion) x 1, then start Zosyn 2.25g IV Q8 Give vancomycin 1.5g IV x 1, then start vancomycin 1g IV Q48 Monitor clinical picture, renal function, VT prn F/U C&S, abx deescalation / LOT      Temp (24hrs), Avg:97.7 F (36.5 C), Min:97.1 F (36.2 C), Max:98.3 F (36.8 C)   Recent Labs Lab 05/23/16 0345 05/29/16 1740 05/29/16 1747  WBC 9.8 21.2*  --   CREATININE 1.66* 7.53*  --   LATICACIDVEN  --   --  2.77*    Estimated Creatinine Clearance: 7.3 mL/min (by C-G formula based on Cr of 7.53).    No Known Allergies  Antimicrobials this admission: Zosyn 6/7 >>  Vancomycin 6/7 >>   Dose adjustments this admission: n/a  Microbiology results: n/a  Thank you for allowing pharmacy to be a part of this patient's care.  Armandina StammerBATCHELDER,Lamoine Fredricksen J 05/29/2016 7:10 PM

## 2016-05-29 NOTE — ED Notes (Signed)
Condom cath applied

## 2016-05-29 NOTE — Progress Notes (Addendum)
Location:  [facility] Room Number: 606  Place of Service:  SNF [id number]) Provider:  Richarda Blade, NP  Dustin Givens, MD  Patient Care Team: Dustin Nam, MD as PCP - General  Extended Emergency Contact Information Primary Emergency Contact: Dustin [name] Address: [address]          [place], [place] [address] [place] Home Phone: [phone] Mobile Phone: [phone] Relation: Spouse Secondary Emergency Contact: Dustin [name]  [place] of [place] Home Phone: [phone] Mobile Phone: [phone] Relation: Daughter  Code Status:  Full Code Goals of care: Advanced Directive information Advanced Directives 05/18/2016  Does patient have an advance directive? No  Type of Advance Directive -  Does patient want to make changes to advanced directive? No - Patient declined  Would patient like information on creating an advanced directive? No - patient declined information     Chief Complaint  Patient presents with  . Acute Visit    Acute    HPI:  Pt is a 82 y.o. male seen today at [facility]  for an acute visit for evaluation of leukocytosis. He is for short term Rehabilitation post left hip IM nailing 05/18/2016. He is seen in his room today with wife at bedside. Patient's wife states he has had a decreased appetite and not feeling well today. He denies any fever or chills. Facility Nurse in during visit to give morning medication observed patient hold medication chewing and has to attempt swallowing multiple times. He states left hip pain under control. His recent lab results WBC 20 05/28/2016. Portable CXR done 05/28/2016 negative findings. Urine specimen results pending. He reports pain and burning with urination.   Addendum: Received patient's CMP results drawn 05/29/2016 K+ 7.0 Facility Nurse supervisor reports patient's oxygen saturation down in the 70's.   Past Medical History  Diagnosis Date  .  Hyperlipidemia   . Hypertension   . Abdominal aortic aneurysm (HCC)     with endoleak per Dr. Janetta Hora, aneurysmal sac size stable as of 12/11 (7.5 x 6.1cm)  . Benign prostatic hypertrophy   . History of chickenpox   . Dysuria   . Heart disease   . Gout   . Sigmoid diverticulosis   . Melanoma in situ [facility])     Per [facility] 2014  . MI (myocardial infarction) (HCC)   . Coronary artery disease   . Pulmonary fibrosis (HCC)   . CHF (congestive heart failure) (HCC)   . Emphysema lung [facility])    Past Surgical History  Procedure Laterality Date  . Hernia repair  2011    Right  . Coronary artery bypass graft  1991  . Bilateral cataract surgery  2011  . Aaa stent  2008  . Left cea  1997  . Intramedullary (im) nail intertrochanteric Left 05/18/2016    Procedure: INTRAMEDULLARY (IM) NAIL INTERTROCHANTRIC;  Surgeon: Dustin Geralds, MD;  Location: [facility] OR;  Service: Orthopedics;  Laterality: Left;    No Known Allergies    Medication List       This list is accurate as of: 05/29/16  9:48 AM.  Always use your most recent med list.               allopurinol 300 MG tablet  Commonly known as:  ZYLOPRIM  Take 300 mg by mouth daily.     ascorbic acid 1000 MG tablet  Commonly known as:  VITAMIN C  Take 1,000 mg by  mouth daily.     aspirin 325 MG EC tablet  Take 325 mg by mouth 2 (two) times daily. Take for 4 weeks. Stop date 06/23/16     atorvastatin 20 MG tablet  Commonly known as:  LIPITOR  Take 1 tablet (20 mg total) by mouth daily at 6 PM.     clotrimazole-betamethasone cream  Commonly known as:  LOTRISONE  Apply 1 application topically 2 (two) times daily as needed. For itching from proteinuria     diltiazem 300 MG 24 hr capsule  Commonly known as:  TIAZAC  Take 300 mg by mouth daily.     docusate sodium 100 MG capsule  Commonly known as:  COLACE  Take 200 mg by mouth daily.     dutasteride 0.5 MG capsule  Commonly known as:  AVODART  Take 0.5 mg by mouth  at bedtime.     furosemide 40 MG tablet  Commonly known as:  LASIX  Take 1 tablet (40 mg total) by mouth 2 (two) times daily.     HYDROcodone-acetaminophen 5-325 MG tablet  Commonly known as:  NORCO  Take 1 tablet by mouth every 6 (six) hours as needed for moderate pain.     potassium chloride SA 20 MEQ tablet  Commonly known as:  K-DUR,KLOR-CON  Take 20 mEq by mouth 2 (two) times daily. Take with Lasix     VITAMIN D3 PO  Take 1,000 Units by mouth daily.     vitamin E 1000 UNIT capsule  Take 1,000 Units by mouth daily.        Review of Systems  Constitutional: Positive for appetite change. Negative for fever, chills, activity change and fatigue.  HENT: Negative for congestion, rhinorrhea, sinus pressure, sneezing and sore throat.   Eyes: Negative.   Respiratory: Negative for cough, chest tightness and wheezing.        Oxygen 3 liters nasal cannula   Cardiovascular: Positive for leg swelling. Negative for chest pain and palpitations.  Gastrointestinal: Positive for abdominal distention. Negative for nausea, vomiting, abdominal pain, diarrhea and constipation.  Endocrine: Negative.   Genitourinary: Negative for urgency, frequency and flank pain.       Pain and burning with urination   Musculoskeletal: Positive for gait problem.  Skin: Negative for color change, pallor and rash.       Left hip surgical drsg  Neurological: Negative for dizziness, tremors, seizures, light-headedness and headaches.  Hematological: Does not bruise/bleed easily.  Psychiatric/Behavioral: Negative for hallucinations, confusion, sleep disturbance and agitation. The patient is not nervous/anxious.     Immunization History  Administered Date(s) Administered  . Influenza Split 10/05/2012  . Influenza,inj,Quad PF,36+ Mos 10/13/2013, 09/27/2014, 09/20/2015  . PPD Test 05/23/2016  . Pneumococcal Polysaccharide-23 12/24/2007  . Zoster 12/23/2008   Pertinent  Health Maintenance Due  Topic Date Due  .  PNA vac Low Risk Adult (2 of 2 - PCV13) 12/23/2008  . INFLUENZA VACCINE  07/23/2016   Fall Risk  12/20/2013  Risk for fall due to : History of fall(s)   Functional Status Survey:    Filed Vitals:   05/29/16 0927  BP: 130/80  Pulse: 79  Temp: 97.1 F (36.2 C)  Resp: 20  Height: 5\' 7"  (1.702 m)  Weight: 191 lb 8 oz (86.864 kg)  SpO2: 97%   Body mass index is 29.99 kg/(m^2). Physical Exam  Constitutional: He appears well-developed and well-nourished. No distress.  Elderly   HENT:  Head: Normocephalic.  Mouth/Throat: Oropharynx is clear and moist.  No oropharyngeal exudate.  HOH   Eyes: Conjunctivae are normal. Pupils are equal, round, and reactive to light. Right eye exhibits no discharge. Left eye exhibits no discharge. No scleral icterus.  Neck: Normal range of motion. No JVD present. No thyromegaly present.  Cardiovascular: Normal rate, regular rhythm, normal heart sounds and intact distal pulses.  Exam reveals no gallop and no friction rub.   No murmur heard. Pulmonary/Chest: No respiratory distress. He has no wheezes. He has no rales.  Respiration laboured. Oxygen 3 liters Nasal cannula.   Abdominal: Soft. Bowel sounds are normal. He exhibits distension. He exhibits no mass. There is no tenderness. There is no rebound and no guarding.  LBM 05/29/2016  Musculoskeletal: He exhibits no tenderness.  Bilateral lower extremities +2-3+ edema   Lymphadenopathy:    He has no cervical adenopathy.  Neurological: He is alert.  Skin: Skin is warm and dry. No rash noted. No erythema. No pallor.  Left surgical DRSG dry clean and intact. Surrounding tissue without any redness or tenderness.   Psychiatric: He has a normal mood and affect.    Labs reviewed:  Recent Labs  05/20/16 1254 05/21/16 0226 05/22/16 0609 05/23/16 0345  NA 139 140 142 140  K 4.5 4.4 4.6 4.1  CL 107 108 111 105  CO2 26 28 23 28   GLUCOSE 136* 108* 109* 100*  BUN 41* 43* 41* 44*  CREATININE 2.53* 2.42*  1.70* 1.66*  CALCIUM 7.7* 7.4* 8.0* 8.1*  MG  --  1.9  --   --   PHOS 4.7*  --  3.0 4.2    Recent Labs  05/18/16 1111 05/20/16 0436  05/21/16 0226 05/22/16 0609 05/23/16 0345  AST 13* 17  --  19  --   --   ALT 11* 6*  --  5*  --   --   ALKPHOS 61 48  --  47  --   --   BILITOT 0.7 0.4  --  0.3  --   --   PROT 5.1* 4.4*  --  4.2*  --   --   ALBUMIN 2.9* 2.4*  < > 2.0* 2.0* 1.8*  < > = values in this interval not displayed.  Recent Labs  05/18/16 1111  05/20/16 0436  05/21/16 0226 05/22/16 0609 05/23/16 0345  WBC 8.8  < > 12.7*  < > 10.3 11.2* 9.8  NEUTROABS 7.4  --  10.0*  --  8.2*  --   --   HGB 11.0*  < > 7.8*  < > 8.1* 8.7* 8.1*  HCT 35.8*  < > 25.1*  < > 25.3* 27.8* 25.5*  MCV 86.1  < > 86.9  < > 88.5 88.5 87.6  PLT 137*  < > 110*  < > 117* 154 174  < > = values in this interval not displayed. Lab Results  Component Value Date   TSH 1.27 08/29/2015   Lab Results  Component Value Date   HGBA1C 6.2* 08/29/2015   Lab Results  Component Value Date   CHOL 174 08/29/2015   HDL 73* 08/29/2015   LDLCALC 55 08/05/2014   TRIG 47 08/29/2015    Significant Diagnostic Results in last 30 days:  Dg Chest 1 View  05/18/2016  CLINICAL DATA:  Dizziness after getting out of bed and falling. EXAM: CHEST 1 VIEW COMPARISON:  09/08/2015. FINDINGS: Normal sized heart. Small amount of linear density at the left lung base. Stable prominence of the interstitial markings, hyper expansion of the  lungs and diffuse peribronchial thickening. Diffuse osteopenia. Old, healed right humeral neck fracture. Post CABG changes. IMPRESSION: 1. Minimal linear atelectasis or scarring at the left lung base. 2. Stable changes of COPD and chronic bronchitis. Electronically Signed   By: Beckie Salts M.D.   On: 05/18/2016 11:53   Ct Head Wo Contrast  05/18/2016  CLINICAL DATA:  Status post fall, left hip pain EXAM: CT HEAD WITHOUT CONTRAST TECHNIQUE: Contiguous axial images were obtained from the base of  the skull through the vertex without intravenous contrast. COMPARISON:  None. FINDINGS: There is no evidence of mass effect, midline shift, or extra-axial fluid collections. There is no evidence of a space-occupying lesion or intracranial hemorrhage. There is no evidence of a cortical-based area of acute infarction. 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 There is generalized cerebral atrophy. There is periventricular white matter low attenuation likely secondary to microangiopathy. The ventricles and sulci are appropriate for the patient's age. The basal cisterns are patent. Visualized portions of the orbits are unremarkable. There is right maxillary sinus mucosal thickening. Cerebrovascular atherosclerotic calcifications are noted. The osseous structures are unremarkable. IMPRESSION: No acute intracranial pathology. Electronically Signed   By: Elige Ko   On: 05/18/2016 12:16   US Renal  05/20/2016  CLINICAL DATA:  82 year old hypertensive male with acute renal failure. Initial encounter. EXAM: RENAL / URINARY TRACT ULTRASOUND COMPLETE COMPARISON:  None. FINDINGS: Right Kidney: Length: 10.2 cm. Mild renal parenchymal thinning. Echogenic renal parenchyma. Findings may indicate changes of medical renal disease. No hydronephrosis or mass noted. Left Kidney: Length: 10.7 cm. 7.1 x 3 x 5.6 cm bilobed cyst mid to upper aspect of the left kidney. Mild renal parenchymal thinning. Echogenic renal parenchyma. Findings may indicate changes of medical renal disease. Bladder: Foley catheter in place and bladder decompressed with evaluation therefore limited. IMPRESSION: No hydronephrosis. Mild bilateral renal parenchymal thinning and increased parenchymal echogenicity. Findings may indicate changes of medical renal disease. Large bilobed left renal cyst measuring up to 7.1 cm. Electronically Signed   By: Lacy Duverney M.D.   On: 05/20/2016 15:46   Dg C-arm 61-120 Min  05/18/2016  CLINICAL  DATA:  Patient status post ORIF left femur fracture. EXAM: DG C-ARM 61-120 MIN; LEFT FEMUR 2 VIEWS COMPARISON:  Hip radiograph 05/18/2016. FINDINGS: Patient status post intra medullary rod and screw fixation of comminuted intratrochanteric left femur fracture. Improved anatomic alignment at the fracture site. Four intraoperative fluoroscopic images were submitted. IMPRESSION: Patient status post intra medullary rod fixation comminuted proximal left femur fracture. Electronically Signed   By: Annia Belt M.D.   On: 05/18/2016 18:47   Dg Hip Unilat With Pelvis 2-3 Views Left  05/18/2016  CLINICAL DATA:  Left hip pain after or getting out of bed and falling this morning. EXAM: DG HIP (WITH OR WITHOUT PELVIS) 2-3V LEFT COMPARISON:  None. FINDINGS: Comminuted left intertrochanteric fracture with varus angulation. Diffuse osteopenia. Bilateral iliac artery stents. Atheromatous arterial calcifications. IMPRESSION: Comminuted left intertrochanteric fracture with varus angulation. Electronically Signed   By: Beckie Salts M.D.   On: 05/18/2016 11:55   Dg Femur Min 2 Views Left  05/18/2016  CLINICAL DATA:  Patient status post ORIF left femur fracture. EXAM: DG C-ARM 61-120 MIN; LEFT FEMUR 2 VIEWS COMPARISON:  Hip radiograph 05/18/2016. FINDINGS: Patient status post intra medullary rod and screw fixation of comminuted intratrochanteric left femur fracture. Improved anatomic alignment at the fracture site. Four intraoperative fluoroscopic images were submitted. IMPRESSION: Patient status post intra  medullary rod fixation comminuted proximal left femur fracture. Electronically Signed   By: Annia Beltrew  Davis M.D.   On: 05/18/2016 18:47    Assessment/Plan Leukocytosis Afebrile. WBC 20 (05/28/2016). CXR negative findings.U/A and C/S results pending. Reports Pain/ Burning with voiding. Decreased appetite. Will initiate Cipro 500 mg Tablet one by mouth twice daily X 7 days for UTI. Florastor 250 mg Capsule twice daily X 10 days.  Revaluate antibiotics once culture and sensitivity results returns.  Vital signs Q shift x 1 week then resume previous orders.   Distended Abdomen  Negative for Nausea or vomiting. LBM 05/29/2016. Slight Labored respiration noted.suspect possible CHF related. Will obtain Abdominal Ultrasound to rule out other etiology.   Leg edema  Bilateral +2-3+ edema. Slight shortness of breath noted. Increase Furosemide to 80 mg tablet twice daily.check BMP 05/31/2016.    Family/ staff Communication: Reviewed plan of care with patient, patient's wife and facility Nurse.   Labs/tests ordered: Abdominal Ultrasound 05/29/2016.  BMP 05/31/2016.   Spend more than 40 minutes planning and coodrinating care for the patient.   Addendum  Hyperkalemia  K+ 7.0 patient's has had change in condition with oxygen level in the 70's. Will Send to ER for evaluation.

## 2016-05-29 NOTE — H&P (Signed)
PULMONARY / CRITICAL CARE MEDICINE   Name: Dustin [name] MRN: [id number] DOB: 1926-08-03    ADMISSION DATE:  05/29/2016 CONSULTATION DATE:  05/29/2016  REFERRING MD:  Ethelda Chick  CHIEF COMPLAINT:  Acute renal failure  HISTORY OF PRESENT ILLNESS:   This is a 82 y/o male with a past medical history for hypertension, CAD and CHF who presented to [facility] emergency department on 05/29/2016 from his nursing home. The patient was encephalopathic on my exam to the point where he was unable to provide a consistent history so this history is obtained primarily from chart review and discussion with other providers. He was just discharged from our from [facility] on 05/23/2016 after he had a hip fracture requiring left intramedullary nailing. During his hospitalization he had acute on chronic kidney failure and was evaluated by Dr. Fayrene Fearing Deterding. During his initial workup for acute kidney failure was noted to have a low fractional excretion of sodium and apparently he was treated initially with volume. Later during his hospital visit he was treated with diuretics. His kidney function improved during his hospitalization and he was discharged with Lasix to a nursing facility.  He was unable to provide reliable history from me but as I understand it from reviewing notes he developed dyspnea today and was treated with Solu-Medrol and BiPAP in the emergency department.  He notes some abdominal pain but denies nausea vomiting or diarrhea. Apart from that he's not able to provide much history.  PAST MEDICAL HISTORY :  He  has a past medical history of Hyperlipidemia; Hypertension; Abdominal aortic aneurysm (HCC); Benign prostatic hypertrophy; History of chickenpox; Dysuria; Heart disease; Gout; Sigmoid diverticulosis; Melanoma in situ [facility]); MI (myocardial infarction) (HCC); Coronary artery disease; Pulmonary fibrosis (HCC); CHF (congestive heart failure) (HCC); and Emphysema lung (HCC).  PAST SURGICAL  HISTORY: He  has past surgical history that includes Hernia repair (2011); Coronary artery bypass graft (1991); Bilateral cataract surgery (2011); AAA stent (2008); Left CEA (1997); and Intramedullary (im) nail intertrochanteric (Left, 05/18/2016).  No Known Allergies  No current facility-administered medications on file prior to encounter.   Current Outpatient Prescriptions on File Prior to Encounter  Medication Sig  . allopurinol (ZYLOPRIM) 300 MG tablet Take 300 mg by mouth daily.   Marland Kitchen ascorbic acid (VITAMIN C) 1000 MG tablet Take 1,000 mg by mouth daily.  Marland Kitchen aspirin 325 MG EC tablet Take 325 mg by mouth 2 (two) times daily. Take for 4 weeks. Stop date 06/23/16  . atorvastatin (LIPITOR) 20 MG tablet Take 1 tablet (20 mg total) by mouth daily at 6 PM.  . Cholecalciferol (VITAMIN D3 PO) Take 1,000 Units by mouth daily.  . ciprofloxacin (CIPRO) 500 MG tablet Take 1 tablet (500 mg total) by mouth 2 (two) times daily.  . clotrimazole-betamethasone (LOTRISONE) cream Apply 1 application topically 2 (two) times daily as needed. For itching from proteinuria  . diltiazem (TIAZAC) 300 MG 24 hr capsule Take 300 mg by mouth daily.   Marland Kitchen docusate sodium (COLACE) 100 MG capsule Take 200 mg by mouth daily.  Marland Kitchen dutasteride (AVODART) 0.5 MG capsule Take 0.5 mg by mouth at bedtime.   . furosemide (LASIX) 40 MG tablet Take 2 tablets (80 mg total) by mouth 2 (two) times daily. Give first dose now.  Marland Kitchen HYDROcodone-acetaminophen (NORCO) 5-325 MG tablet Take 1 tablet by mouth every 6 (six) hours as needed for moderate pain.  . potassium chloride SA (K-DUR,KLOR-CON) 20 MEQ tablet Take 20 mEq by mouth 2 (two)  times daily. Take with Lasix  . vitamin E 1000 UNIT capsule Take 1,000 Units by mouth daily.    FAMILY HISTORY:  His indicated that his mother is deceased. He indicated that his father is deceased. He indicated that his sister is alive. He indicated that his brother is alive.   SOCIAL HISTORY: He  reports that he  quit smoking about 27 years ago. His smoking use included Cigarettes. He has a 40 pack-year smoking history. He has never used smokeless tobacco. He reports that he drinks alcohol. He reports that he does not use illicit drugs.  REVIEW OF SYSTEMS:   Cannot obtain due to confusion  SUBJECTIVE:  As above  VITAL SIGNS: BP 178/80 mmHg  Pulse 68  Temp(Src) 98.3 F (36.8 C) (Rectal)  Resp 27  SpO2 100%  HEMODYNAMICS:    VENTILATOR SETTINGS: Vent Mode:  [-] BIPAP FiO2 (%):  [40 %] 40 % PEEP:  [6 cmH20] 6 cmH20  INTAKE / OUTPUT:    PHYSICAL EXAMINATION: General:  Chronically ill-appearing male, confused Neuro:  Sonorous but arouses to touch, speech is clear but he is unable to focus on her conversation, moves all 4 extremities well, not oriented to situation but he is oriented to year HEENT:  Normocephalic/atraumatic mucous membranes are dry Cardiovascular:  Regular rate and rhythm, no murmurs gallops rubs Lungs:  Wheezes bilaterally, normal respiratory effort Abdomen:  Belly distended mildly tender left lower quadrant primarily no guarding or rebound bowel sounds are positive Musculoskeletal:  Normal bulk and tone Skin:  Significant ankle edema bilaterally  LABS:  BMET  Recent Labs Lab 05/23/16 0345 05/29/16 1740 05/29/16 2011 05/29/16 2027  NA 140 131*  --  128*  K 4.1 >7.5* 7.3* 7.0*  CL 105 92*  --  94*  CO2 28 22  --   --   BUN 44* 139*  --  131*  CREATININE 1.66* 7.53*  --  7.50*  GLUCOSE 100* 163*  --  164*    Electrolytes  Recent Labs Lab 05/23/16 0345 05/29/16 1740  CALCIUM 8.1* 8.6*  PHOS 4.2  --     CBC  Recent Labs Lab 05/23/16 0345 05/29/16 1740 05/29/16 2027  WBC 9.8 21.2*  --   HGB 8.1* 10.1* 10.5*  HCT 25.5* 30.7* 31.0*  PLT 174 294  --     Coag's  Recent Labs Lab 05/29/16 1740  INR 1.30    Sepsis Markers  Recent Labs Lab 05/29/16 1747 05/29/16 2025  LATICACIDVEN 2.77* 3.31*    ABG  Recent Labs Lab  05/29/16 1747 05/29/16 1930  PHART 7.595* 7.390  PCO2ART 23.4* 40.2  PO2ART 155.0* 97.0    Liver Enzymes  Recent Labs Lab 05/23/16 0345 05/29/16 1740  AST  --  20  ALT  --  19  ALKPHOS  --  81  BILITOT  --  0.4  ALBUMIN 1.8* 2.4*    Cardiac Enzymes No results for input(s): TROPONINI, PROBNP in the last 168 hours.  Glucose  Recent Labs Lab 05/29/16 1740  GLUCAP 165*    Imaging Dg Chest Portable 1 View  05/29/2016  CLINICAL DATA:  82 year old male with shortness of breath. EXAM: PORTABLE CHEST 1 VIEW COMPARISON:  Chest radiograph dated 05/27 FINDINGS: Single-view of the chest demonstrates emphysematous changes of the lungs with bibasilar linear atelectasis/scarring. There is no focal consolidation, pleural effusion, or pneumothorax. The cardiac silhouette is within normal limits. Median sternotomy wires and bypass ring is noted. There is atherosclerotic calcification of the aortic  arch. Degenerative changes of the spine and shoulders. Old healed right posterior rib fractures noted. No acute fracture. IMPRESSION: No active disease. Electronically Signed   By: Elgie CollardArash  Radparvar M.D.   On: 05/29/2016 18:05   05/29/2016 12-lead EKG: Questionable underlying a flutter versus artifact, regular rhythm however, peaked T waves noted  STUDIES:  05/29/2016 renal ultrasound pending  CULTURES: 05/29/2016 urine culture  ANTIBIOTICS: 05/29/2016 vancomycin 05/29/2016 Zosyn  SIGNIFICANT EVENTS:   LINES/TUBES: 05/29/2016 foley >   DISCUSSION: 82 year old male with multiple comorbid illnesses is being admitted from a nursing facility with acute on chronic kidney failure and likely an acute exacerbation of COPD. He is currently an uric but just a few days ago his kidney function was relatively normal after an episode of acute on chronic kidney failure. Etiology of his kidney failure at this time is likely multifactorial considering the fact that he appears to be intravascularly dry and I  have concerned that he may have urinary outlet obstruction.  ASSESSMENT / PLAN:  RENAL A:   AKI > see discussion above Hyperkalemia P:   Foley now  IVF per renal Repeat BMET 0100 on 6/8, place HD cath if K not improving Only indication for HD right now would be failure to improve hyperkalemia Kayexelate per rectum  PULMONARY A: AE COPD, baseline PFT uncertain Pulmonary fibrosis? > uncertain 2013 CT chest images with primarily moderate centrilobular emphysema and non-specific interstitial thickening the bases likely representative of atelectasis, pulmonary venous hypertension or chronic aspiration P:   Solumedrol 60mg  IV q12h Brovana bid Pulmicort bid duoneb q6h Albuterol prn Monitor O2 saturation, keep O2 saturation 90-94%  CARDIOVASCULAR A:  CAD Hypertension Lactic acid elevated but not septic by vital signs, not in shock; suspect related to hypoxemia from respiratory failure now improved P:  Continue Diltiazem Hold diuretics for now Repeat Lactic acid  GASTROINTESTINAL A:   No acute issues P:   Renal diet  HEMATOLOGIC A:   Anemia without bleeding At risk for DVT given recent hip fracture P:  lovenox for DVT prevention  INFECTIOUS A:   Leukocytosis but no evidence of infection based on my exam P:   Monitor off of antibiotics Monitor for fever  ENDOCRINE A:   Hyperglycemia but no documentation of diabetes   P:   Hgb A1c SSI plus accuchecks  NEUROLOGIC A:   Acute encephalopathy in the setting of HD P:   No sedating medications    FAMILY  - Updates: none bedside  - Inter-disciplinary family meet or Palliative Care meeting due by:  6/14  My cc time 50 minutes  Heber CarolinaBrent McQuaid, MD [place] [facility] Pager: [phone] Cell: [phone] After 3pm or if no response, call [phone]   05/29/2016, 9:24 PM

## 2016-05-30 DIAGNOSIS — J9601 Acute respiratory failure with hypoxia: Secondary | ICD-10-CM | POA: Diagnosis present

## 2016-05-30 DIAGNOSIS — A419 Sepsis, unspecified organism: Secondary | ICD-10-CM | POA: Diagnosis present

## 2016-05-30 DIAGNOSIS — E875 Hyperkalemia: Secondary | ICD-10-CM | POA: Diagnosis present

## 2016-05-30 LAB — BASIC METABOLIC PANEL
ANION GAP: 16 — AB (ref 5–15)
ANION GAP: 16 — AB (ref 5–15)
Anion gap: 15 (ref 5–15)
BUN: 126 mg/dL — AB (ref 6–20)
BUN: 133 mg/dL — ABNORMAL HIGH (ref 6–20)
BUN: 135 mg/dL — ABNORMAL HIGH (ref 6–20)
CALCIUM: 8.7 mg/dL — AB (ref 8.9–10.3)
CALCIUM: 8.2 mg/dL — AB (ref 8.9–10.3)
CHLORIDE: 97 mmol/L — AB (ref 101–111)
CHLORIDE: 101 mmol/L (ref 101–111)
CO2: 23 mmol/L (ref 22–32)
CO2: 23 mmol/L (ref 22–32)
CO2: 23 mmol/L (ref 22–32)
CREATININE: 5.96 mg/dL — AB (ref 0.61–1.24)
CREATININE: 5.86 mg/dL — AB (ref 0.61–1.24)
Calcium: 8.1 mg/dL — ABNORMAL LOW (ref 8.9–10.3)
Chloride: 95 mmol/L — ABNORMAL LOW (ref 101–111)
Creatinine, Ser: 7.18 mg/dL — ABNORMAL HIGH (ref 0.61–1.24)
GFR calc Af Amer: 7 mL/min — ABNORMAL LOW (ref >60)
GFR calc non Af Amer: 8 mL/min — ABNORMAL LOW (ref >60)
GFR calc non Af Amer: 8 mL/min — ABNORMAL LOW (ref >60)
GFR, EST AFRICAN AMERICAN: 9 mL/min — AB (ref >60)
GFR, EST AFRICAN AMERICAN: 9 mL/min — AB (ref >60)
GFR, EST NON AFRICAN AMERICAN: 6 mL/min — AB (ref >60)
GLUCOSE: 175 mg/dL — AB (ref 65–99)
Glucose, Bld: 167 mg/dL — ABNORMAL HIGH (ref 65–99)
Glucose, Bld: 207 mg/dL — ABNORMAL HIGH (ref 65–99)
POTASSIUM: 4.3 mmol/L (ref 3.5–5.1)
Potassium: 5.4 mmol/L — ABNORMAL HIGH (ref 3.5–5.1)
Potassium: 6.4 mmol/L (ref 3.5–5.1)
SODIUM: 135 mmol/L (ref 135–145)
SODIUM: 140 mmol/L (ref 135–145)
Sodium: 134 mmol/L — ABNORMAL LOW (ref 135–145)

## 2016-05-30 LAB — RENAL FUNCTION PANEL
Albumin: 2 g/dL — ABNORMAL LOW (ref 3.5–5.0)
Anion gap: 13 (ref 5–15)
BUN: 131 mg/dL — AB (ref 6–20)
CHLORIDE: 100 mmol/L — AB (ref 101–111)
CO2: 22 mmol/L (ref 22–32)
CREATININE: 6.25 mg/dL — AB (ref 0.61–1.24)
Calcium: 8.2 mg/dL — ABNORMAL LOW (ref 8.9–10.3)
GFR calc Af Amer: 8 mL/min — ABNORMAL LOW (ref >60)
GFR, EST NON AFRICAN AMERICAN: 7 mL/min — AB (ref >60)
Glucose, Bld: 164 mg/dL — ABNORMAL HIGH (ref 65–99)
Phosphorus: 6.3 mg/dL — ABNORMAL HIGH (ref 2.5–4.6)
Potassium: 5.4 mmol/L — ABNORMAL HIGH (ref 3.5–5.1)
Sodium: 135 mmol/L (ref 135–145)

## 2016-05-30 LAB — CBC
HEMATOCRIT: 24.1 % — AB (ref 39.0–52.0)
HEMOGLOBIN: 7.9 g/dL — AB (ref 13.0–17.0)
MCH: 27.3 pg (ref 26.0–34.0)
MCHC: 32.8 g/dL (ref 30.0–36.0)
MCV: 83.4 fL (ref 78.0–100.0)
Platelets: 276 10*3/uL (ref 150–400)
RBC: 2.89 MIL/uL — ABNORMAL LOW (ref 4.22–5.81)
RDW: 16.3 % — AB (ref 11.5–15.5)
WBC: 16.5 10*3/uL — ABNORMAL HIGH (ref 4.0–10.5)

## 2016-05-30 LAB — GLUCOSE, CAPILLARY
GLUCOSE-CAPILLARY: 167 mg/dL — AB (ref 65–99)
GLUCOSE-CAPILLARY: 182 mg/dL — AB (ref 65–99)
GLUCOSE-CAPILLARY: 205 mg/dL — AB (ref 65–99)
Glucose-Capillary: 185 mg/dL — ABNORMAL HIGH (ref 65–99)
Glucose-Capillary: 169 mg/dL — ABNORMAL HIGH (ref 65–99)
Glucose-Capillary: 219 mg/dL — ABNORMAL HIGH (ref 65–99)

## 2016-05-30 LAB — PHOSPHORUS: Phosphorus: 5.7 mg/dL — ABNORMAL HIGH (ref 2.5–4.6)

## 2016-05-30 LAB — MRSA PCR SCREENING: MRSA by PCR: NEGATIVE

## 2016-05-30 LAB — MAGNESIUM: Magnesium: 2.6 mg/dL — ABNORMAL HIGH (ref 1.7–2.4)

## 2016-05-30 LAB — LACTIC ACID, PLASMA: Lactic Acid, Venous: 2.8 mmol/L (ref 0.5–2.0)

## 2016-05-30 MED ORDER — PREDNISONE 20 MG PO TABS
40.0000 mg | ORAL_TABLET | Freq: Every day | ORAL | Status: DC
  Administered 2016-05-31 – 2016-06-01 (×2): 40 mg via ORAL
  Filled 2016-05-30 (×2): qty 2

## 2016-05-30 MED ORDER — HEPARIN SODIUM (PORCINE) 5000 UNIT/ML IJ SOLN: 5000.0000 [IU] | Freq: Three times a day (TID) | INTRAMUSCULAR | Status: DC

## 2016-05-30 MED ORDER — SODIUM POLYSTYRENE SULFONATE 15 GM/60ML PO SUSP
30.0000 g | Freq: Once | ORAL | Status: AC
  Administered 2016-05-30: 30 g via ORAL
  Filled 2016-05-30: qty 120

## 2016-05-30 MED ORDER — ENOXAPARIN SODIUM 30 MG/0.3ML ~~LOC~~ SOLN
30.0000 mg | SUBCUTANEOUS | Status: DC
  Administered 2016-05-30 – 2016-06-02 (×4): 30 mg via SUBCUTANEOUS
  Filled 2016-05-30 (×4): qty 0.3

## 2016-05-30 NOTE — H&P (Addendum)
PULMONARY / CRITICAL CARE MEDICINE   Name: Dustin [name] MRN: [id number] DOB: 05/20/1926    ADMISSION DATE:  05/29/2016 CONSULTATION DATE:  05/29/2016  REFERRING MD:  Ethelda ChickJacubowitz  CHIEF COMPLAINT:  Acute renal failure  HISTORY OF PRESENT ILLNESS:   This is a 82 y/o male with a past medical history for hypertension, CAD and CHF who [facility] on 05/29/2016 from his nursing home. The patient was encephalopathic on my exam to the point where he was unable to provide a consistent history so this history is obtained primarily from chart review and discussion with other providers. He was just discharged from our from [facility] on 05/23/2016 after he had a hip fracture requiring left intramedullary nailing.presents rebnal failure.   SUBJECTIVE:  No pressors,  \no vent No distress  VITAL SIGNS: BP 162/60 mmHg  Pulse 80  Temp(Src) 98.3 F (36.8 C) (Oral)  Resp 21  Wt 89.1 kg (196 lb 6.9 oz)  SpO2 98%  HEMODYNAMICS:    VENTILATOR SETTINGS: Vent Mode:  [-] BIPAP FiO2 (%):  [0 %-40 %] 0 % PEEP:  [6 cmH20] 6 cmH20  INTAKE / OUTPUT: I/O last 3 completed shifts: In: 1440 [I.V.:1200; Other:240] Out: 2050 [Urine:1700; Stool:350]  PHYSICAL EXAMINATION: General:  Chronically ill-appearing male, calmer Neuro:  More awake"hi!!" HEENT:  Normocephalic/atraumatic mucous membranes are dry Cardiovascular:  s1 s2 RRR Lungs:  Wheezes bilaterally improved Abdomen:  bs improved, no r/g, soft Musculoskeletal:  Normal bulk and tone Skin:  Significant ankle edema bilaterally  LABS:  BMET  Recent Labs Lab 05/29/16 1740  05/29/16 2027 05/29/16 2340 05/30/16 0531  NA 131*  --  128* 134* 135  135  K >7.5*  < > 7.0* 6.4* 5.4*  5.4*  CL 92*  --  94* 95* 97*  100*  CO2 22  --   --  23 23  22   BUN 139*  --  131* 135* 126*  131*  CREATININE 7.53*  --  7.50* 7.18* 5.96*  6.25*  GLUCOSE 163*  --  164* 175* 167*  164*  < > = values in this  interval not displayed.  Electrolytes  Recent Labs Lab 05/29/16 1740 05/29/16 2340 05/30/16 0531  CALCIUM 8.6* 8.7* 8.2*  8.2*  MG  --   --  2.6*  PHOS  --   --  5.7*  6.3*    CBC  Recent Labs Lab 05/29/16 1740 05/29/16 2027 05/29/16 2330 05/30/16 0531  WBC 21.2*  --  17.5* 16.5*  HGB 10.1* 10.5* 9.0* 7.9*  HCT 30.7* 31.0* 27.5* 24.1*  PLT 294  --  281 276    Coag's  Recent Labs Lab 05/29/16 1740  INR 1.30    Sepsis Markers  Recent Labs Lab 05/29/16 1747 05/29/16 2025 05/29/16 2325  LATICACIDVEN 2.77* 3.31* 2.8*    ABG  Recent Labs Lab 05/29/16 1747 05/29/16 1930  PHART 7.595* 7.390  PCO2ART 23.4* 40.2  PO2ART 155.0* 97.0    Liver Enzymes  Recent Labs Lab 05/29/16 1740 05/30/16 0531  AST 20  --   ALT 19  --   ALKPHOS 81  --   BILITOT 0.4  --   ALBUMIN 2.4* 2.0*    Cardiac Enzymes No results for input(s): TROPONINI, PROBNP in the last 168 hours.  Glucose  Recent Labs Lab 05/29/16 1740 05/29/16 2251 05/29/16 2342 05/30/16 0816  GLUCAP 165* 185* 182* 169*    Imaging Koreas Renal  05/29/2016  CLINICAL DATA:  Acute kidney injury. EXAM: RENAL / URINARY TRACT ULTRASOUND COMPLETE COMPARISON:  05/20/2016 FINDINGS: Right Kidney: Length: 10.8 cm (stable). Stable echogenic appearance of the renal cortex and mild atrophy. No hydronephrosis. Left Kidney: Length: 11.4 cm (stable). Stable echogenic renal cortex with mild atrophy. No hydronephrosis. Stable septated renal cyst measuring approximately 6 cm in greatest dimensions. Bladder: Distended bladder contains a mild amount of dependent debris. IMPRESSION: Stable bilateral echogenic kidneys with mild atrophy. Findings are suggestive of chronic kidney disease. No evidence of hydronephrosis. A distended bladder contains a mild amount of dependent debris. Electronically Signed   By: Irish Lack M.D.   On: 05/29/2016 21:58   Dg Chest Portable 1 View  05/29/2016  CLINICAL DATA:  81 year old male  with shortness of breath. EXAM: PORTABLE CHEST 1 VIEW COMPARISON:  Chest radiograph dated 05/27 FINDINGS: Single-view of the chest demonstrates emphysematous changes of the lungs with bibasilar linear atelectasis/scarring. There is no focal consolidation, pleural effusion, or pneumothorax. The cardiac silhouette is within normal limits. Median sternotomy wires and bypass ring is noted. There is atherosclerotic calcification of the aortic arch. Degenerative changes of the spine and shoulders. Old healed right posterior rib fractures noted. No acute fracture. IMPRESSION: No active disease. Electronically Signed   By: Elgie Collard M.D.   On: 05/29/2016 18:05   05/29/2016 12-lead EKG: Questionable underlying a flutter versus artifact, regular rhythm however, peaked T waves noted  STUDIES:  05/29/2016 renal ultrasound >>>neg hydro, med dz  CULTURES: 05/29/2016 urine culture  ANTIBIOTICS: 05/29/2016 vancomycin off 05/29/2016 Zosyn off  SIGNIFICANT EVENTS: 6/8- increased urine, improved  LINES/TUBES: 05/29/2016 foley >   DISCUSSION: 82 year old male with multiple comorbid illnesses is being admitted from a nursing facility with acute on chronic kidney failure and likely an acute exacerbation of COPD. He is currently an uric but just a few days ago his kidney function was relatively normal after an episode of acute on chronic kidney failure. Etiology of his kidney failure at this time is likely multifactorial considering the fact that he appears to be intravascularly dry and I have concerned that he may have urinary outlet obstruction.  ASSESSMENT / PLAN:  RENAL A:   AKI > see discussion above Hyperkalemia improved resolving acute on chronic P:   Foley now  Kayexelate repeat again once Responding to volume , pos balance Renal following I reviewed [place] Needs foley  PULMONARY A: AE COPD, baseline PFT uncertain Pulmonary fibrosis? > uncertain 2013 CT chest images with primarily moderate  centrilobular emphysema and non-specific interstitial thickening the bases likely representative of atelectasis, pulmonary venous hypertension or chronic aspiration P:   Solumedrol  IV q12h, reduce to pred Brovana bid Pulmicort bid duoneb q6h Albuterol prn Monitor O2 saturation, keep O2 saturation 90-94%  CARDIOVASCULAR A:  CAD Hypertension P:  Continue Diltiazem Hold diuretics further  GASTROINTESTINAL A:   No acute issues P:   Renal diet Dc fluid restirction  HEMATOLOGIC A:   Anemia without bleeding At risk for DVT given recent hip fracture P:  lovenox as had nail hip  INFECTIOUS A:   Leukocytosis resolcing P:   Monitor for fever  ENDOCRINE A:   Hyperglycemia but no documentation of diabetes   P:   Hgb A1c - pending SSI  NEUROLOGIC A:   Acute encephalopathy in the setting of HD - resolving P:   No sedating medications Get pt consult   FAMILY  - Updates: none bedside  - Inter-disciplinary family meet or Palliative Care meeting due by:  6/14  To triad, med floor Mcarthur Rossetti. Tyson Alias, MD, FACP Pgr: [phone] [facility]

## 2016-05-30 NOTE — Care Management Note (Addendum)
Case Management Note  Patient Details  Name: Wynonia LawmanGeorge R Maione MRN: [id number] Date of Birth: 09/29/1926  Subjective/Objective:        Pt admitted with AKI            Action/Plan:  Pt is from [facility] - went on 6/1 post hip surgery.  CSW consulted. Pt informed CM that he has VA benefits - [facility] and gets medications [facility] at [facility].  CM contacted [facility] and informed of admit - transfer form not requested at this time,  CM also contacted [facility] PCP Henrene PastorLilliana Pantea to inform of admit.   CM will continue to follow for discharge needs   Expected Discharge Date:                  Expected Discharge Plan:  Skilled Nursing Facility [facility])  In-House Referral:  Clinical Social Work  Discharge planning Services  CM Consult  Post Acute Care Choice:    Choice offered to:     DME Arranged:    DME Agency:     HH Arranged:    HH Agency:     Status of Service:  In process, will continue to follow  Medicare Important Message Given:    Date Medicare IM Given:    Medicare IM give by:    Date Additional Medicare IM Given:    Additional Medicare Important Message give by:     If discussed at Long Length of Stay Meetings, dates discussed:    Additional Comments:  Cherylann ParrClaxton, Micael Barb S, RN 05/30/2016, 2:08 PM

## 2016-05-30 NOTE — Progress Notes (Signed)
eLink Physician-Brief Progress Note Patient Name: Dustin LawmanGeorge R Gottsch DOB: 04/22/1926 MRN: [id number]   Date of Service  05/30/2016  HPI/Events of Note  K=6.4 last night, decreased UO  eICU Interventions  Check bmp this am     Intervention Category Major Interventions: Acute renal failure - evaluation and management  Sable Knoles 05/30/2016, 4:29 AM

## 2016-05-30 NOTE — Progress Notes (Signed)
S:No new CO O:BP 162/60 mmHg  Pulse 80  Temp(Src) 98.3 F (36.8 C) (Oral)  Resp 21  Wt 89.1 kg (196 lb 6.9 oz)  SpO2 98%  Intake/Output Summary (Last 24 hours) at 05/30/16 1133 Last data filed at 05/30/16 0600  Gross per 24 hour  Intake   1440 ml  Output   2050 ml  Net   -610 ml   Weight change:  ZOX:WRUEA and alert VWU:JWJXB,JYNWG Resp:clear Abd:+ BS Mild tenderness upper abd, no rebound or guarding Ext: tr-1+ edema NEURO:CNI Ox2  Though Antonieta Loveless was president and he was in NH   . arformoterol  15 mcg Nebulization BID  . budesonide (PULMICORT) nebulizer solution  0.5 mg Nebulization BID  . diltiazem  300 mg Oral Daily  . enoxaparin (LOVENOX) injection  30 mg Subcutaneous Q24H  . insulin aspart  0-9 Units Subcutaneous TID WC  . ipratropium-albuterol  3 mL Nebulization Q6H  . [START ON 05/31/2016] predniSONE  40 mg Oral Q breakfast   US Renal  05/29/2016  CLINICAL DATA:  Acute kidney injury. EXAM: RENAL / URINARY TRACT ULTRASOUND COMPLETE COMPARISON:  05/20/2016 FINDINGS: Right Kidney: Length: 10.8 cm (stable). Stable echogenic appearance of the renal cortex and mild atrophy. No hydronephrosis. Left Kidney: Length: 11.4 cm (stable). Stable echogenic renal cortex with mild atrophy. No hydronephrosis. Stable septated renal cyst measuring approximately 6 cm in greatest dimensions. Bladder: Distended bladder contains a mild amount of dependent debris. IMPRESSION: Stable bilateral echogenic kidneys with mild atrophy. Findings are suggestive of chronic kidney disease. No evidence of hydronephrosis. A distended bladder contains a mild amount of dependent debris. Electronically Signed   By: Irish Lack M.D.   On: 05/29/2016 21:58   Dg Chest Portable 1 View  05/29/2016  CLINICAL DATA:  82 year old male with shortness of breath. EXAM: PORTABLE CHEST 1 VIEW COMPARISON:  Chest radiograph dated 05/27 FINDINGS: Single-view of the chest demonstrates emphysematous changes of the lungs with bibasilar  linear atelectasis/scarring. There is no focal consolidation, pleural effusion, or pneumothorax. The cardiac silhouette is within normal limits. Median sternotomy wires and bypass ring is noted. There is atherosclerotic calcification of the aortic arch. Degenerative changes of the spine and shoulders. Old healed right posterior rib fractures noted. No acute fracture. IMPRESSION: No active disease. Electronically Signed   By: Elgie Collard M.D.   On: 05/29/2016 18:05   BMET    Component Value Date/Time   NA 135 05/30/2016 0531   NA 135 05/30/2016 0531   K 5.4* 05/30/2016 0531   K 5.4* 05/30/2016 0531   K 3.4 06/28/2014   CL 100* 05/30/2016 0531   CL 97* 05/30/2016 0531   CO2 22 05/30/2016 0531   CO2 23 05/30/2016 0531   GLUCOSE 164* 05/30/2016 0531   GLUCOSE 167* 05/30/2016 0531   BUN 131* 05/30/2016 0531   BUN 126* 05/30/2016 0531   CREATININE 6.25* 05/30/2016 0531   CREATININE 5.96* 05/30/2016 0531   CREATININE 1.2 08/29/2015   CALCIUM 8.2* 05/30/2016 0531   CALCIUM 8.2* 05/30/2016 0531   GFRNONAA 7* 05/30/2016 0531   GFRNONAA 8* 05/30/2016 0531   GFRAA 8* 05/30/2016 0531   GFRAA 9* 05/30/2016 0531   CBC    Component Value Date/Time   WBC 16.5* 05/30/2016 0531   RBC 2.89* 05/30/2016 0531   HGB 7.9* 05/30/2016 0531   HCT 24.1* 05/30/2016 0531   PLT 276 05/30/2016 0531   MCV 83.4 05/30/2016 0531   MCH 27.3 05/30/2016 0531   MCHC 32.8 05/30/2016  0531   RDW 16.3* 05/30/2016 0531   LYMPHSABS 1.9 05/29/2016 1740   MONOABS 0.6 05/29/2016 1740   EOSABS 0.0 05/29/2016 1740   BASOSABS 0.0 05/29/2016 1740     Assessment: 1. ARF on CKD 3 ? Sec volume depletion and bladder outlet obstruction. Scr trending down 2. Hyperkalemia, improving  Plan: 1. Cont IV fluids but will decrease rate 2. Daily labs.  Renal fx should return to baseline and it may just be a matter of time    Luwana Butrick T

## 2016-05-30 NOTE — Progress Notes (Signed)
Dr. Briant CedarMattingly into patient room, patient has decreased urine output, foley catheter flushed and 350 immediately returned.

## 2016-05-31 ENCOUNTER — Inpatient Hospital Stay (HOSPITAL_COMMUNITY): Payer: Medicare Other

## 2016-05-31 DIAGNOSIS — D62 Acute posthemorrhagic anemia: Secondary | ICD-10-CM

## 2016-05-31 LAB — GLUCOSE, CAPILLARY
GLUCOSE-CAPILLARY: 137 mg/dL — AB (ref 65–99)
Glucose-Capillary: 172 mg/dL — ABNORMAL HIGH (ref 65–99)
Glucose-Capillary: 252 mg/dL — ABNORMAL HIGH (ref 65–99)
Glucose-Capillary: 192 mg/dL — ABNORMAL HIGH (ref 65–99)

## 2016-05-31 LAB — CBC
HEMATOCRIT: 22.1 % — AB (ref 39.0–52.0)
Hemoglobin: 7 g/dL — ABNORMAL LOW (ref 13.0–17.0)
MCH: 27.2 pg (ref 26.0–34.0)
MCHC: 31.7 g/dL (ref 30.0–36.0)
MCV: 86 fL (ref 78.0–100.0)
Platelets: 281 10*3/uL (ref 150–400)
RBC: 2.57 MIL/uL — ABNORMAL LOW (ref 4.22–5.81)
RDW: 16.7 % — AB (ref 11.5–15.5)
WBC: 16.2 10*3/uL — ABNORMAL HIGH (ref 4.0–10.5)

## 2016-05-31 LAB — RENAL FUNCTION PANEL
ALBUMIN: 1.9 g/dL — AB (ref 3.5–5.0)
Anion gap: 14 (ref 5–15)
BUN: 122 mg/dL — AB (ref 6–20)
CO2: 27 mmol/L (ref 22–32)
Calcium: 8.1 mg/dL — ABNORMAL LOW (ref 8.9–10.3)
Chloride: 104 mmol/L (ref 101–111)
Creatinine, Ser: 4.32 mg/dL — ABNORMAL HIGH (ref 0.61–1.24)
GFR calc Af Amer: 13 mL/min — ABNORMAL LOW (ref >60)
GFR calc non Af Amer: 11 mL/min — ABNORMAL LOW (ref >60)
GLUCOSE: 170 mg/dL — AB (ref 65–99)
PHOSPHORUS: 6.4 mg/dL — AB (ref 2.5–4.6)
POTASSIUM: 3.3 mmol/L — AB (ref 3.5–5.1)
Sodium: 145 mmol/L (ref 135–145)

## 2016-05-31 LAB — MPO/PR-3 (ANCA) ANTIBODIES

## 2016-05-31 LAB — BASIC METABOLIC PANEL
Anion gap: 11 (ref 5–15)
BUN: 103 mg/dL — AB (ref 6–20)
CALCIUM: 8.1 mg/dL — AB (ref 8.9–10.3)
CO2: 27 mmol/L (ref 22–32)
CREATININE: 3.07 mg/dL — AB (ref 0.61–1.24)
Chloride: 103 mmol/L (ref 101–111)
GFR calc Af Amer: 20 mL/min — ABNORMAL LOW (ref >60)
GFR calc non Af Amer: 17 mL/min — ABNORMAL LOW (ref >60)
GLUCOSE: 190 mg/dL — AB (ref 65–99)
Potassium: 3.2 mmol/L — ABNORMAL LOW (ref 3.5–5.1)
Sodium: 141 mmol/L (ref 135–145)

## 2016-05-31 LAB — C3 COMPLEMENT: C3 Complement: 132 mg/dL (ref 82–167)

## 2016-05-31 LAB — HEMOGLOBIN A1C
HEMOGLOBIN A1C: 6 % — AB (ref 4.8–5.6)
Mean Plasma Glucose: 126 mg/dL

## 2016-05-31 LAB — PREPARE RBC (CROSSMATCH)

## 2016-05-31 LAB — C4 COMPLEMENT: Complement C4, Body Fluid: 25 mg/dL (ref 14–44)

## 2016-05-31 LAB — UREA NITROGEN, URINE: Urea Nitrogen, Ur: 275 mg/dL

## 2016-05-31 MED ORDER — SODIUM CHLORIDE 0.9 % IV SOLN
Freq: Once | INTRAVENOUS | Status: AC
  Administered 2016-05-31: 17:00:00 via INTRAVENOUS

## 2016-05-31 MED ORDER — IPRATROPIUM-ALBUTEROL 0.5-2.5 (3) MG/3ML IN SOLN
3.0000 mL | Freq: Three times a day (TID) | RESPIRATORY_TRACT | Status: DC
  Administered 2016-05-31 – 2016-06-01 (×3): 3 mL via RESPIRATORY_TRACT
  Filled 2016-05-31 (×3): qty 3

## 2016-05-31 MED ORDER — FUROSEMIDE 10 MG/ML IJ SOLN
10.0000 mg | Freq: Once | INTRAMUSCULAR | Status: AC
  Administered 2016-05-31: 10 mg via INTRAVENOUS
  Filled 2016-05-31: qty 2

## 2016-05-31 MED ORDER — ACETAMINOPHEN 325 MG PO TABS
650.0000 mg | ORAL_TABLET | Freq: Once | ORAL | Status: AC
  Administered 2016-05-31: 650 mg via ORAL
  Filled 2016-05-31: qty 2

## 2016-05-31 MED ORDER — POTASSIUM CHLORIDE 20 MEQ PO PACK
20.0000 meq | PACK | Freq: Two times a day (BID) | ORAL | Status: AC
  Administered 2016-05-31 (×2): 20 meq via ORAL
  Filled 2016-05-31 (×2): qty 1

## 2016-05-31 MED ORDER — ALUM & MAG HYDROXIDE-SIMETH 200-200-20 MG/5ML PO SUSP
30.0000 mL | Freq: Once | ORAL | Status: AC
  Administered 2016-05-31: 30 mL via ORAL
  Filled 2016-05-31: qty 30

## 2016-05-31 MED ORDER — IPRATROPIUM-ALBUTEROL 0.5-2.5 (3) MG/3ML IN SOLN: 3.0000 mL | RESPIRATORY_TRACT | Status: DC | PRN

## 2016-05-31 NOTE — Progress Notes (Signed)
I have reviewed xrays of the left femur and they show good position of left intertroch fx and hardware. Will increase wgt bearing status to 50% PWB on left. New order placed for PT to reflect this. Encourage OOB daily to chair.

## 2016-05-31 NOTE — Progress Notes (Signed)
Dustin Viles, PA-C Physician Assistant Signed Orthopedics Progress Notes 05/23/2016 2:11 PM    Expand All Collapse All   Subjective: 13 Days Post-Op Procedure(s) (LRB): INTRAMEDULLARY (IM) NAIL INTERTROCHANTRIC (Left) Patient reports pain as minimal. Patient is sitting up in bed attempting to drink fluids. He reports he is very thirsty. Dr. Mattingly has suggested hydrating him orally as much as possible.  Objective: Vital signs in last 24 hours: Temp: [98.3 F (36.8 C)-98.6 F (37 C)] 98.3 F (36.8 C) (06/09 0330) Pulse Rate: [45-89] 82 (06/09 0330) Resp: [15-27] 18 (06/09 0330) BP: (105-179)/(48-85) 156/58 mmHg (06/09 0330) SpO2: [88 %-99 %] 91 % (06/09 0330) Weight: [90.402 kg (199 lb 4.8 oz)] 90.402 kg (199 lb 4.8 oz) (06/09 0500)  Intake/Output from previous day:   Intake/Output this shift:     Recent Labs (last 2 labs)      Recent Labs  05/29/16 1740 05/29/16 2027 05/29/16 2330 05/30/16 0531 05/31/16 0327  HGB 10.1* 10.5* 9.0* 7.9* 7.0*      Recent Labs (last 2 labs)      Recent Labs  05/30/16 0531 05/31/16 0327  WBC 16.5* 16.2*  RBC 2.89* 2.57*  HCT 24.1* 22.1*  PLT 276 281      Recent Labs (last 2 labs)      Recent Labs  05/30/16 1635 05/31/16 0327  NA 140 145  K 4.3 3.3*  CL 101 104  CO2 23 27  BUN 133* 122*  CREATININE 5.86* 4.32*  GLUCOSE 207* 170*  CALCIUM 8.1* 8.1*      Recent Labs (last 2 labs)      Recent Labs  05/29/16 1740  INR 1.30    Patient is moderately alert. He is aware of time and place. Left hip exam: Bilateral hip dressing benign. Moderate left thigh swelling as would be expected. He has diffuse swelling of both lower extremities. His calves are soft. His calves are nontender. He has minimal pain with attempted range of motion of the left hip.   Assessment/Plan: 13 Days Post-Op Procedure(s) (LRB): INTRAMEDULLARY (IM) NAIL INTERTROCHANTRIC (Left) Blood  loss anemia. Hemoglobin of 7. Renal failure. Plan: I will transfuse 2 units packed RBCs today. I will order bilateral knee-high TED stockings with SCDs. I will x-ray the left femur. If looks good we can allow him to progress to weightbearing as tolerated. Encourage physical therapy. He needs to be out of bed daily if possible medically. Dr. David Thompson is on call for the practice over the weekend.   [name] 05/31/2016, 1:05 PM       

## 2016-05-31 NOTE — Clinical Documentation Improvement (Signed)
Critical Care  Can the diagnosis of CHF be further specified?    Acuity - Acute, Chronic, Acute on Chronic   Type - Systolic, Diastolic, Systolic and Diastolic  Other  Clinically Undetermined    Supporting Information: Patient with a history of CHF per 6/08 ED Provider notes. 6/07: BNP: 132.8.   Please exercise your independent, professional judgment when responding. A specific answer is not anticipated or expected.   Thank Sabino DonovanYou,  Meyer Arora Mathews-[facility]ethe[facility] [employer] Information Management [place] [phone]

## 2016-05-31 NOTE — Care Management Important Message (Signed)
Important Message  Patient Details  Name: Dustin [name] MRN: [id number] Date of Birth: 02/15/1926   Medicare Important Message Given:  Yes    Eilis Chestnutt Abena 05/31/2016, 11:11 AM

## 2016-05-31 NOTE — Progress Notes (Signed)
PROGRESS NOTE    Dustin [name]  ZOX:[id number]  DOB: 1926-06-07  DOA: 05/29/2016 PCP: Crawford Givens, MD Outpatient Specialists:   Hospital course: This is a 82 y/o male with a past medical history for hypertension, CAD and CHF who presented [facility] department on 05/29/2016 from his nursing home. The patient was encephalopathic on my exam to the point where he was unable to provide a consistent history so this history is obtained primarily from chart review and discussion with other providers. He was just discharged from our from [facility] on 05/23/2016 after he had a hip fracture requiring left intramedullary nailing. He presented with COPD exacerbation and acute on chronic renal failure.  Initially admitted to ICU and subsequently transferred to floor when he started improving.   Assessment & Plan: Acute COPD Exacerbation - clinically improving, continue current management Acute on Chronic stage 3 renal failure - creatinine trending down, appreciate nephrology consult, repeat BMP in AM Hypokalemia - replete and check magnesium Acute Encephalopathy (metabolic)  - resolving and returning to baseline Chronic CHF - compensated and stable at this time CAD - no chest pain at this time.  Hyperglycemia - likely secondary to stress reaction and prediabetes as a1c is 6.0.  Continue accu-cheks.  Anemia without bleeding - monitoring hemoglobin closely.  Post op s/p recent IM nail hip - continue lovenox for DVT prophylaxis.   DVT prophylaxis: lovenox Code Status: Full  Family Communication:  Disposition Plan: to SNF  Consultants:  nephrology  Subjective: Pt says that he is thirsty today  Objective: Filed Vitals:   05/30/16 2031 05/31/16 0314 05/31/16 0330 05/31/16 0500  BP:   156/58   Pulse:   82   Temp:   98.3 F (36.8 C)   TempSrc:   Oral   Resp:   18   Weight:    199 lb 4.8 oz (90.402 kg)  SpO2: 94% 92% 91%     Intake/Output Summary (Last 24 hours)  at 05/31/16 1505 Last data filed at 05/31/16 0941  Gross per 24 hour  Intake 1245.5 ml  Output   2000 ml  Net -754.5 ml   Filed Weights   05/29/16 2300 05/30/16 0400 05/31/16 0500  Weight: 193 lb 9 oz (87.8 kg) 196 lb 6.9 oz (89.1 kg) 199 lb 4.8 oz (90.402 kg)    Exam:  General: Chronically ill-appearing male, calmer Neuro: awake, alert, nonfocal HEENT: Normocephalic/atraumatic mucous membranes are dry Cardiovascular: s1 s2 RRR Lungs: Wheezes bilaterally improved Abdomen: bs improved, no r/g, soft Musculoskeletal: Normal bulk and tone Skin: Significant ankle edema bilaterally  Data Reviewed: Basic Metabolic Panel:  Recent Labs Lab 05/29/16 1740  05/29/16 2027 05/29/16 2340 05/30/16 0531 05/30/16 1635 05/31/16 0327  NA 131*  --  128* 134* 135  135 140 145  K >7.5*  < > 7.0* 6.4* 5.4*  5.4* 4.3 3.3*  CL 92*  --  94* 95* 97*  100* 101 104  CO2 22  --   --  GLUCOSE 163*  --  164* 175* 167*  164* 207* 170*  BUN 139*  --  131* 135* 126*  131* 133* 122*  CREATININE 7.53*  --  7.50* 7.18* 5.96*  6.25* 5.86* 4.32*  CALCIUM 8.6*  --   --  8.7* 8.2*  8.2* 8.1* 8.1*  MG  --   --   --   --  2.6*  --   --   PHOS  --   --   --   --  5.7*  6.3*  --  6.4*  < > = values in this interval not displayed. Liver Function Tests:  Recent Labs Lab 05/29/16 1740 05/30/16 0531 05/31/16 0327  AST 20  --   --   ALT 19  --   --   ALKPHOS 81  --   --   BILITOT 0.4  --   --   PROT 5.8*  --   --   ALBUMIN 2.4* 2.0* 1.9*   No results for input(s): LIPASE, AMYLASE in the last 168 hours. No results for input(s): AMMONIA in the last 168 hours. CBC:  Recent Labs Lab 05/29/16 1740 05/29/16 2027 05/29/16 2330 05/30/16 0531 05/31/16 0327  WBC 21.2*  --  17.5* 16.5* 16.2*  NEUTROABS 18.7*  --   --   --   --   HGB 10.1* 10.5* 9.0* 7.9* 7.0*  HCT 30.7* 31.0* 27.5* 24.1* 22.1*  MCV 86.2  --  84.1 83.4 86.0  PLT 294  --  281 276 281   Cardiac  Enzymes: No results for input(s): CKTOTAL, CKMB, CKMBINDEX, TROPONINI in the last 168 hours. BNP (last 3 results)  Recent Labs  09/11/15 1652  PROBNP 117.0*   CBG:  Recent Labs Lab 05/30/16 1154 05/30/16 1554 05/30/16 2212 05/31/16 0651 05/31/16 1151  GLUCAP 219* 205* 167* 172* 137*    Recent Results (from the past 240 hour(s))  Urine culture     Status: Abnormal (Preliminary result)   Collection Time: 05/29/16 10:00 PM  Result Value Ref Range Status   Specimen Description URINE, RANDOM  Final   Special Requests NONE  Final   Culture >=100,000 COLONIES/mL PSEUDOMONAS AERUGINOSA (A)  Final   Report Status PENDING  Incomplete  MRSA PCR Screening     Status: None   Collection Time: 05/29/16 10:44 PM  Result Value Ref Range Status   MRSA by PCR NEGATIVE NEGATIVE Final    Comment:        The GeneXpert MRSA Assay (FDA approved for NASAL specimens only), is one component of a comprehensive MRSA colonization surveillance program. It is not intended to diagnose MRSA infection nor to guide or monitor treatment for MRSA infections.      Studies: [place] Renal  05/29/2016  CLINICAL DATA:  Acute kidney injury. EXAM: RENAL / URINARY TRACT ULTRASOUND COMPLETE COMPARISON:  05/20/2016 FINDINGS: Right Kidney: Length: 10.8 cm (stable). Stable echogenic appearance of the renal cortex and mild atrophy. No hydronephrosis. Left Kidney: Length: 11.4 cm (stable). Stable echogenic renal cortex with mild atrophy. No hydronephrosis. Stable septated renal cyst measuring approximately 6 cm in greatest dimensions. Bladder: Distended bladder contains a mild amount of dependent debris. IMPRESSION: Stable bilateral echogenic kidneys with mild atrophy. Findings are suggestive of chronic kidney disease. No evidence of hydronephrosis. A distended bladder contains a mild amount of dependent debris. Electronically Signed   By: Irish LackGlenn  Yamagata M.D.   On: 05/29/2016 21:58   Dg Chest Portable 1 View  05/29/2016   CLINICAL DATA:  82 year old male with shortness of breath. EXAM: PORTABLE CHEST 1 VIEW COMPARISON:  Chest radiograph dated 05/27 FINDINGS: Single-view of the chest demonstrates emphysematous changes of the lungs with bibasilar linear atelectasis/scarring. There is no focal consolidation, pleural effusion, or pneumothorax. The cardiac silhouette is within normal limits. Median sternotomy wires and bypass ring is noted. There is atherosclerotic calcification of the aortic arch. Degenerative changes of the spine and shoulders. Old healed right posterior rib fractures noted. No acute fracture. IMPRESSION: No active disease. Electronically Signed  By: Elgie Collard M.D.   On: 05/29/2016 18:05     Scheduled Meds: . sodium chloride   Intravenous Once  . arformoterol  15 mcg Nebulization BID  . budesonide (PULMICORT) nebulizer solution  0.5 mg Nebulization BID  . diltiazem  300 mg Oral Daily  . enoxaparin (LOVENOX) injection  30 mg Subcutaneous Q24H  . furosemide  10 mg Intravenous Once  . insulin aspart  0-9 Units Subcutaneous TID WC  . ipratropium-albuterol  3 mL Nebulization TID  . potassium chloride  20 mEq Oral BID  . predniSONE  40 mg Oral Q breakfast   Continuous Infusions:   Active Problems:   AKI (acute kidney injury) (HCC)   Acute respiratory failure with hypoxia (HCC)   Hyperkalemia   Sepsis (HCC)   Time spent:    Standley Dakins, MD, FAAFP [facility] Pager [phone]  If 7PM-7AM, please contact night-coverage [email or web address] Password [email or web address] 05/31/2016, 3:05 PM    LOS: 2 days

## 2016-05-31 NOTE — Progress Notes (Signed)
S: More alert.  Appetite poor O:BP 156/58 mmHg  Pulse 82  Temp(Src) 98.3 F (36.8 C) (Oral)  Resp 18  Wt 90.402 kg (199 lb 4.8 oz)  SpO2 91%  Intake/Output Summary (Last 24 hours) at 05/31/16 1117 Last data filed at 05/31/16 0941  Gross per 24 hour  Intake   2473 ml  Output   3500 ml  Net  -1027 ml   Weight change: 2.602 kg (5 lb 11.8 oz) ZHY:QMVHQ and alert ION:GEXBM,WUXLK Resp:clear Abd:+ BS Mild tenderness upper abd, no rebound or guarding Ext: 1+ edema NEURO:CNI Ox3 but  Still says Antonieta Loveless is president   . arformoterol  15 mcg Nebulization BID  . budesonide (PULMICORT) nebulizer solution  0.5 mg Nebulization BID  . diltiazem  300 mg Oral Daily  . enoxaparin (LOVENOX) injection  30 mg Subcutaneous Q24H  . insulin aspart  0-9 Units Subcutaneous TID WC  . ipratropium-albuterol  3 mL Nebulization TID  . predniSONE  40 mg Oral Q breakfast   US Renal  05/29/2016  CLINICAL DATA:  Acute kidney injury. EXAM: RENAL / URINARY TRACT ULTRASOUND COMPLETE COMPARISON:  05/20/2016 FINDINGS: Right Kidney: Length: 10.8 cm (stable). Stable echogenic appearance of the renal cortex and mild atrophy. No hydronephrosis. Left Kidney: Length: 11.4 cm (stable). Stable echogenic renal cortex with mild atrophy. No hydronephrosis. Stable septated renal cyst measuring approximately 6 cm in greatest dimensions. Bladder: Distended bladder contains a mild amount of dependent debris. IMPRESSION: Stable bilateral echogenic kidneys with mild atrophy. Findings are suggestive of chronic kidney disease. No evidence of hydronephrosis. A distended bladder contains a mild amount of dependent debris. Electronically Signed   By: Irish Lack M.D.   On: 05/29/2016 21:58   Dg Chest Portable 1 View  05/29/2016  CLINICAL DATA:  82 year old male with shortness of breath. EXAM: PORTABLE CHEST 1 VIEW COMPARISON:  Chest radiograph dated 05/27 FINDINGS: Single-view of the chest demonstrates emphysematous changes of the lungs with  bibasilar linear atelectasis/scarring. There is no focal consolidation, pleural effusion, or pneumothorax. The cardiac silhouette is within normal limits. Median sternotomy wires and bypass ring is noted. There is atherosclerotic calcification of the aortic arch. Degenerative changes of the spine and shoulders. Old healed right posterior rib fractures noted. No acute fracture. IMPRESSION: No active disease. Electronically Signed   By: Elgie Collard M.D.   On: 05/29/2016 18:05   BMET    Component Value Date/Time   NA 145 05/31/2016 0327   K 3.3* 05/31/2016 0327   K 3.4 06/28/2014   CL 104 05/31/2016 0327   CO2 27 05/31/2016 0327   GLUCOSE 170* 05/31/2016 0327   BUN 122* 05/31/2016 0327   CREATININE 4.32* 05/31/2016 0327   CREATININE 1.2 08/29/2015   CALCIUM 8.1* 05/31/2016 0327   GFRNONAA 11* 05/31/2016 0327   GFRAA 13* 05/31/2016 0327   CBC    Component Value Date/Time   WBC 16.2* 05/31/2016 0327   RBC 2.57* 05/31/2016 0327   HGB 7.0* 05/31/2016 0327   HCT 22.1* 05/31/2016 0327   PLT 281 05/31/2016 0327   MCV 86.0 05/31/2016 0327   MCH 27.2 05/31/2016 0327   MCHC 31.7 05/31/2016 0327   RDW 16.7* 05/31/2016 0327   LYMPHSABS 1.9 05/29/2016 1740   MONOABS 0.6 05/29/2016 1740   EOSABS 0.0 05/29/2016 1740   BASOSABS 0.0 05/29/2016 1740     Assessment: 1. ARF on CKD 3 ? Sec volume depletion and bladder outlet obstruction. UO excellent and Scr trending down 2. Hypokalemia 3. HTN  Plan: 1.  Replace K.  Make sure he stays hydrated.  He says he is thristy and no water jug in room.  Will talk with nurse 2. Daily labs.    Kiven Vangilder T

## 2016-05-31 NOTE — Progress Notes (Signed)
Subjective: 13 Days Post-Op Procedure(s) (LRB): INTRAMEDULLARY (IM) NAIL INTERTROCHANTRIC (Left) Patient reports pain as minimal. Patient is sitting up in bed attempting to drink fluids. He reports he is very thirsty. Dr. Briant CedarMattingly has suggested hydrating him orally as much as possible.  Objective: Vital signs in last 24 hours: Temp:  [98.3 F (36.8 C)-98.6 F (37 C)] 98.3 F (36.8 C) (06/09 0330) Pulse Rate:  [45-89] 82 (06/09 0330) Resp:  [15-27] 18 (06/09 0330) BP: (105-179)/(48-85) 156/58 mmHg (06/09 0330) SpO2:  [88 %-99 %] 91 % (06/09 0330) Weight:  [90.402 kg (199 lb 4.8 oz)] 90.402 kg (199 lb 4.8 oz) (06/09 0500)  Intake/Output from previous day:   Intake/Output this shift:     Recent Labs  05/29/16 1740 05/29/16 2027 05/29/16 2330 05/30/16 0531 05/31/16 0327  HGB 10.1* 10.5* 9.0* 7.9* 7.0*    Recent Labs  05/30/16 0531 05/31/16 0327  WBC 16.5* 16.2*  RBC 2.89* 2.57*  HCT 24.1* 22.1*  PLT 276 281    Recent Labs  05/30/16 1635 05/31/16 0327  NA 140 145  K 4.3 3.3*  CL 101 104  CO2 23 27  BUN 133* 122*  CREATININE 5.86* 4.32*  GLUCOSE 207* 170*  CALCIUM 8.1* 8.1*    Recent Labs  05/29/16 1740  INR 1.30  Patient is moderately alert. He is aware of time and place. Left hip exam: Bilateral hip dressing benign. Moderate left thigh swelling as would be expected. He has diffuse swelling of both lower extremities. His calves are soft. His calves are nontender. He has minimal pain with attempted range of motion of the left hip.   Assessment/Plan: 13 Days Post-Op Procedure(s) (LRB): INTRAMEDULLARY (IM) NAIL INTERTROCHANTRIC (Left) Blood loss anemia. Hemoglobin of 7. Renal failure. Plan: I will transfuse 2 units packed RBCs today. I will order bilateral knee-high TED stockings with SCDs. I will x-ray the left femur. If looks good we can allow him to progress to weightbearing as tolerated. Encourage physical therapy. He needs to be out of bed daily  if possible medically. Dr. Mack Hookavid Thompson is on call for the practice over the weekend.   Blain Hunsucker G 05/31/2016, 1:05 PM

## 2016-05-31 NOTE — Evaluation (Signed)
Physical Therapy Evaluation Patient Details Name: Dustin [name] MRN: [id number] DOB: 1926-11-08 Today's Date: 05/31/2016   History of Present Illness  Pt. admitted 05/29/16 with PMH of HTN, CAD,h/o CABG, AAA  s/p stent, CHF, CIPD, pulmonary fibrosis on home O2.  Was DC'd from [facility] on 05/23/16 following IM nail L hip with TDWB.  Pt. went to [facility] for rehabilitation.  [facility] with encephalopathy, acute kidney failure on CKD3, AECOPD.  Pt. now has rectal tube  Clinical Impression  Pt. Presents to PT with above history and below deficits and will benefit from a trial of PT to determine if he can tolerate/benefit from PT.  When pt. was DC'd to SNF on 05/23/16, he required +2 total assist for bed mobility attempts.  Pt. Currently has a rectal tube and appears with significant generalized weakness superimposed on his L LE weakness from recent surgery. Will follow to see if he can progress.    Follow Up Recommendations SNF    Equipment Recommendations  None recommended by PT    Recommendations for Other Services       Precautions / Restrictions Precautions Precautions: Fall Restrictions Weight Bearing Restrictions: Yes LLE Weight Bearing: Touchdown weight bearing Other Position/Activity Restrictions: I spoke with Gus Puma, ortho PA this am.  He states he plans to order repeat films of hip in hopes to advance pt's WB status.      Mobility  Bed Mobility Overal bed mobility: Needs Assistance             General bed mobility comments: Attempted to assist pt. toward EOB.  Pt. toleared only limited movement of L LE toward EOB.  Pt. unable to move past L LE movement in an attempt to sit at EOB.  Will need +2 for futrue attempts .  Pt. required +2 total assist and use of bed pad to move up toward head of bed.  Transfers                    Ambulation/Gait                Stairs            Wheelchair Mobility    Modified Rankin (Stroke  Patients Only)       Balance                                             Pertinent Vitals/Pain Pain Assessment: Faces Faces Pain Scale: Hurts even more Pain Location: L LE with movement Pain Descriptors / Indicators: Grimacing;Guarding;Sore Pain Intervention(s): Limited activity within patient's tolerance;Monitored during session;Repositioned    Home Living Family/patient expects to be discharged to:: Skilled nursing facility                      Prior Function Level of Independence: Needs assistance (last PT note on 05/21/16, pt. total assist for bed mobility)               Hand Dominance        Extremity/Trunk Assessment   Upper Extremity Assessment: Defer to OT evaluation           Lower Extremity Assessment: Generalized weakness (generalized weakness of (B) LEs, especially left)         Communication      Cognition Arousal/Alertness: Awake/alert Behavior During  Therapy: Flat affect Overall Cognitive Status: Impaired/Different from baseline Area of Impairment: Attention;Following commands;Problem solving   Current Attention Level: Sustained Memory: Decreased recall of precautions;Decreased short-term memory Following Commands: Follows one step commands inconsistently;Follows one step commands with increased time     Problem Solving: Slow processing;Decreased initiation;Difficulty sequencing;Requires verbal cues;Requires tactile cues General Comments: Pt. focused on being thirsty but unable to consistently hold his cup to sip from straw    General Comments      Exercises        Assessment/Plan    PT Assessment Patient needs continued PT services  PT Diagnosis Acute pain;Generalized weakness;Difficulty walking   PT Problem List Decreased strength;Decreased range of motion;Decreased activity tolerance;Decreased mobility;Decreased knowledge of precautions;Pain  PT Treatment Interventions DME instruction;Gait  training;Functional mobility training;Therapeutic activities;Therapeutic exercise;Balance training;Patient/family education   PT Goals (Current goals can be found in the Care Plan section) Acute Rehab PT Goals Patient Stated Goal: none stated PT Goal Formulation: Patient unable to participate in goal setting Time For Goal Achievement: 06/14/16 Potential to Achieve Goals: Fair    Frequency Min 3X/week (on a trial basis to determine potential benefit of PT)   Barriers to discharge        Co-evaluation               End of Session   Activity Tolerance: Patient limited by pain Patient left: in bed;with call bell/phone within reach;with bed alarm set Nurse Communication: Mobility status;Need for lift equipment         Time: 1210-1222 PT Time Calculation (min) (ACUTE ONLY): 12 min   Charges:   PT Evaluation $PT Eval Moderate Complexity: 1 Procedure     PT G Codes:        Ferman HammingBlankenship, Malichi Palardy B 05/31/2016, 1:32 PM Weldon PickingSusan Sanjuana Mruk PT Acute Rehab Services [phone]

## 2016-05-31 NOTE — Progress Notes (Signed)
Marshia LyJames Lineth Thielke, PA-C Physician Assistant Signed Orthopedics Progress Notes 05/23/2016 2:11 PM    Expand All Collapse All   Subjective: 13 Days Post-Op Procedure(s) (LRB): INTRAMEDULLARY (IM) NAIL INTERTROCHANTRIC (Left) Patient reports pain as minimal. Patient is sitting up in bed attempting to drink fluids. He reports he is very thirsty. Dr. Briant CedarMattingly has suggested hydrating him orally as much as possible.  Objective: Vital signs in last 24 hours: Temp: [98.3 F (36.8 C)-98.6 F (37 C)] 98.3 F (36.8 C) (06/09 0330) Pulse Rate: [45-89] 82 (06/09 0330) Resp: [15-27] 18 (06/09 0330) BP: (105-179)/(48-85) 156/58 mmHg (06/09 0330) SpO2: [88 %-99 %] 91 % (06/09 0330) Weight: [90.402 kg (199 lb 4.8 oz)] 90.402 kg (199 lb 4.8 oz) (06/09 0500)  Intake/Output from previous day:   Intake/Output this shift:     Recent Labs (last 2 labs)      Recent Labs  05/29/16 1740 05/29/16 2027 05/29/16 2330 05/30/16 0531 05/31/16 0327  HGB 10.1* 10.5* 9.0* 7.9* 7.0*      Recent Labs (last 2 labs)      Recent Labs  05/30/16 0531 05/31/16 0327  WBC 16.5* 16.2*  RBC 2.89* 2.57*  HCT 24.1* 22.1*  PLT 276 281      Recent Labs (last 2 labs)      Recent Labs  05/30/16 1635 05/31/16 0327  NA 140 145  K 4.3 3.3*  CL 101 104  CO2 23 27  BUN 133* 122*  CREATININE 5.86* 4.32*  GLUCOSE 207* 170*  CALCIUM 8.1* 8.1*      Recent Labs (last 2 labs)      Recent Labs  05/29/16 1740  INR 1.30    Patient is moderately alert. He is aware of time and place. Left hip exam: Bilateral hip dressing benign. Moderate left thigh swelling as would be expected. He has diffuse swelling of both lower extremities. His calves are soft. His calves are nontender. He has minimal pain with attempted range of motion of the left hip.   Assessment/Plan: 13 Days Post-Op Procedure(s) (LRB): INTRAMEDULLARY (IM) NAIL INTERTROCHANTRIC (Left) Blood  loss anemia. Hemoglobin of 7. Renal failure. Plan: I will transfuse 2 units packed RBCs today. I will order bilateral knee-high TED stockings with SCDs. I will x-ray the left femur. If looks good we can allow him to progress to weightbearing as tolerated. Encourage physical therapy. He needs to be out of bed daily if possible medically. Dr. Mack Hookavid Thompson is on call for the practice over the weekend.   Cierah Crader G 05/31/2016, 1:05 PM

## 2016-06-01 DIAGNOSIS — I509 Heart failure, unspecified: Secondary | ICD-10-CM

## 2016-06-01 LAB — TYPE AND SCREEN
ABO/RH(D): O POS
Antibody Screen: NEGATIVE
Unit division: 0
Unit division: 0

## 2016-06-01 LAB — CBC
HCT: 30.6 % — ABNORMAL LOW (ref 39.0–52.0)
Hemoglobin: 10 g/dL — ABNORMAL LOW (ref 13.0–17.0)
MCH: 27.6 pg (ref 26.0–34.0)
MCHC: 32.7 g/dL (ref 30.0–36.0)
MCV: 84.5 fL (ref 78.0–100.0)
PLATELETS: 257 10*3/uL (ref 150–400)
RBC: 3.62 MIL/uL — ABNORMAL LOW (ref 4.22–5.81)
RDW: 17 % — AB (ref 11.5–15.5)
WBC: 23.3 10*3/uL — AB (ref 4.0–10.5)

## 2016-06-01 LAB — GLUCOSE, CAPILLARY
GLUCOSE-CAPILLARY: 181 mg/dL — AB (ref 65–99)
GLUCOSE-CAPILLARY: 180 mg/dL — AB (ref 65–99)
Glucose-Capillary: 139 mg/dL — ABNORMAL HIGH (ref 65–99)
Glucose-Capillary: 168 mg/dL — ABNORMAL HIGH (ref 65–99)

## 2016-06-01 LAB — URINE CULTURE

## 2016-06-01 LAB — BASIC METABOLIC PANEL
Anion gap: 10 (ref 5–15)
BUN: 96 mg/dL — AB (ref 6–20)
CHLORIDE: 104 mmol/L (ref 101–111)
CO2: 28 mmol/L (ref 22–32)
CREATININE: 2.72 mg/dL — AB (ref 0.61–1.24)
Calcium: 7.9 mg/dL — ABNORMAL LOW (ref 8.9–10.3)
GFR calc Af Amer: 23 mL/min — ABNORMAL LOW (ref >60)
GFR calc non Af Amer: 20 mL/min — ABNORMAL LOW (ref >60)
Glucose, Bld: 155 mg/dL — ABNORMAL HIGH (ref 65–99)
Potassium: 3.3 mmol/L — ABNORMAL LOW (ref 3.5–5.1)
SODIUM: 142 mmol/L (ref 135–145)

## 2016-06-01 LAB — MAGNESIUM: MAGNESIUM: 2.5 mg/dL — AB (ref 1.7–2.4)

## 2016-06-01 MED ORDER — HYDRALAZINE HCL 20 MG/ML IJ SOLN
15.0000 mg | Freq: Once | INTRAMUSCULAR | Status: AC
  Administered 2016-06-01: 15 mg via INTRAVENOUS
  Filled 2016-06-01: qty 1

## 2016-06-01 MED ORDER — POTASSIUM CHLORIDE CRYS ER 20 MEQ PO TBCR
40.0000 meq | EXTENDED_RELEASE_TABLET | Freq: Once | ORAL | Status: DC
  Filled 2016-06-01: qty 2

## 2016-06-01 MED ORDER — HYDRALAZINE HCL 20 MG/ML IJ SOLN
10.0000 mg | INTRAMUSCULAR | Status: DC | PRN
  Administered 2016-06-01 – 2016-06-02 (×2): 10 mg via INTRAVENOUS
  Filled 2016-06-01 (×2): qty 1

## 2016-06-01 MED ORDER — PREDNISONE 20 MG PO TABS
20.0000 mg | ORAL_TABLET | Freq: Every day | ORAL | Status: DC
  Administered 2016-06-02 – 2016-06-03 (×2): 20 mg via ORAL
  Filled 2016-06-01 (×2): qty 1

## 2016-06-01 MED ORDER — ACETAMINOPHEN 500 MG PO TABS
500.0000 mg | ORAL_TABLET | Freq: Four times a day (QID) | ORAL | Status: DC | PRN
  Administered 2016-06-01 – 2016-06-03 (×3): 500 mg via ORAL
  Filled 2016-06-01 (×3): qty 1

## 2016-06-01 MED ORDER — POTASSIUM CHLORIDE CRYS ER 20 MEQ PO TBCR
40.0000 meq | EXTENDED_RELEASE_TABLET | Freq: Once | ORAL | Status: AC
  Administered 2016-06-01: 40 meq via ORAL
  Filled 2016-06-01: qty 2

## 2016-06-01 MED ORDER — HYDRALAZINE HCL 20 MG/ML IJ SOLN
10.0000 mg | Freq: Four times a day (QID) | INTRAMUSCULAR | Status: DC | PRN
  Administered 2016-06-01 (×2): 10 mg via INTRAVENOUS
  Filled 2016-06-01 (×2): qty 1

## 2016-06-01 MED ORDER — CIPROFLOXACIN HCL 500 MG PO TABS
250.0000 mg | ORAL_TABLET | Freq: Two times a day (BID) | ORAL | Status: DC
  Administered 2016-06-01 – 2016-06-03 (×5): 250 mg via ORAL
  Filled 2016-06-01 (×5): qty 1

## 2016-06-01 NOTE — NC FL2 (Signed)
[place] MEDICAID FL2 LEVEL OF CARE SCREENING TOOL     IDENTIFICATION  Patient Name: Dustin [name] Birthdate: Dec 18, 1926 Sex: male Admission Date (Current Location): 05/29/2016  [facility] and [place] Number:  Producer, television/film/video and Address:  [facility], [address] [place], [place] [address]      Provider Number: [id number]  Attending Physician Name and Address:  Cleora Fleet, MD  Relative Name and Phone Number:       Current Level of Care: Hospital Recommended Level of Care: Skilled Nursing Facility Prior Approval Number:    Date Approved/Denied:   PASRR Number: [id number]  Discharge Plan: SNF    Current Diagnoses: Patient Active Problem List   Diagnosis Date Noted  . Acute blood loss anemia 05/31/2016  . Acute respiratory failure with hypoxia (HCC)   . Hyperkalemia   . Sepsis (HCC)   . Postoperative anemia due to acute blood loss 05/19/2016  . AKI (acute kidney injury) (HCC) 05/19/2016  . Hip fracture, left (HCC) 05/18/2016  . Hip fracture (HCC) 05/18/2016  . Diastolic congestive heart failure (HCC) 09/13/2015  . Renal cyst 09/13/2015  . Thyroid nodule 09/13/2015  . Emphysema of lung (HCC) 09/13/2015  . Pulmonary nodule 09/13/2015  . S/P CABG (coronary artery bypass graft) 08/10/2014  . Proteinuria 08/10/2014  . Melanoma in situ (HCC) 01/07/2013  . Edema 08/10/2012  . Dysuria 01/25/2011  . HEART DISEASE 07/18/2010  . BENIGN PROSTATIC HYPERTROPHY, HX OF 07/18/2010  . CHICKENPOX, HX OF 07/18/2010  . HYPERLIPIDEMIA 07/13/2010  . Essential hypertension 07/13/2010  . ABDOMINAL AORTIC ANEURYSM 07/13/2010    Orientation RESPIRATION BLADDER Height & Weight     Self, Situation, Place  O2 (2L) Continent, Indwelling catheter Weight: 199 lb 9.6 oz (90.538 kg) Height:     BEHAVIORAL SYMPTOMS/MOOD NEUROLOGICAL BOWEL NUTRITION STATUS      Incontinent Diet (Renal Diet)  AMBULATORY STATUS COMMUNICATION OF NEEDS Skin   Extensive  Assist Verbally Surgical wounds, PU Stage and Appropriate Care (Sacrum pressure ulcer (dressing change PRN))                       Personal Care Assistance Level of Assistance  Bathing, Feeding, Dressing Bathing Assistance: Maximum assistance Feeding assistance: Limited assistance Dressing Assistance: Maximum assistance     Functional Limitations Info  Sight, Hearing, Speech Sight Info: Adequate Hearing Info: Adequate Speech Info: Adequate    SPECIAL CARE FACTORS FREQUENCY  PT (By licensed PT), OT (By licensed OT)     PT Frequency: 5x OT Frequency: 5x            Contractures Contractures Info: Not present    Additional Factors Info  Code Status, Allergies, Insulin Sliding Scale Code Status Info: Full Code Allergies Info: NKA   Insulin Sliding Scale Info: Dose: 0-9 Units Freq: 3 times daily with meals Route: [place]       Current Medications (06/01/2016):  This is the current hospital active medication list Current Facility-Administered Medications  Medication Dose Route Frequency Provider Last Rate Last Dose  . 0.9 %  sodium chloride infusion  250 mL Intravenous PRN Lupita Leash, MD 10 mL/hr at 05/30/16 1755 250 mL at 05/30/16 1755  . arformoterol (BROVANA) nebulizer solution 15 mcg  15 mcg Nebulization BID Lupita Leash, MD   15 mcg at 06/01/16 0752  . budesonide (PULMICORT) nebulizer solution 0.5 mg  0.5 mg Nebulization BID Lupita Leash, MD   0.5 mg at  06/01/16 0754  . diltiazem (CARDIZEM CD) 24 hr capsule 300 mg  300 mg Oral Daily Lupita Leashouglas B McQuaid, MD   300 mg at 06/01/16 0933  . enoxaparin (LOVENOX) injection 30 mg  30 mg Subcutaneous Q24H Nelda Bucksaniel J Feinstein, MD   30 mg at 05/31/16 2225  . hydrALAZINE (APRESOLINE) injection 10 mg  10 mg Intravenous Q6H PRN Rolan Lipahomas Michael Callahan, NP   10 mg at 06/01/16 0817  . insulin aspart (novoLOG) injection 0-9 Units  0-9 Units Subcutaneous TID WC Lupita Leashouglas B McQuaid, MD   1 Units at 06/01/16 [phone]  .  ipratropium-albuterol (DUONEB) 0.5-2.5 (3) MG/3ML nebulizer solution 3 mL  3 mL Nebulization TID Clanford Cyndie MullL Johnson, MD   3 mL at 06/01/16 0748  . ipratropium-albuterol (DUONEB) 0.5-2.5 (3) MG/3ML nebulizer solution 3 mL  3 mL Nebulization Q4H PRN Clanford L Johnson, MD      . potassium chloride SA (K-DUR,KLOR-CON) CR tablet 40 mEq  40 mEq Oral Once Clanford Cyndie MullL Johnson, MD   40 mEq at 06/01/16 0845  . [START ON 06/02/2016] predniSONE (DELTASONE) tablet 20 mg  20 mg Oral Q breakfast Clanford Cyndie MullL Johnson, MD         Discharge Medications: Please see discharge summary for a list of discharge medications.  Relevant Imaging Results:  Relevant Lab Results:   Additional Information SS:  [id number]  Raye SorrowCoble, Tomma Ehinger N, LCSW

## 2016-06-01 NOTE — Clinical Social Work Note (Signed)
Clinical Social Work Assessment  Patient Details  Name: Dustin [name] MRN: [id number] Date of Birth: [birth year]  Date of referral:  06/01/16               Reason for consult:  Discharge Planning, Other (Comment Required) (readmission, recently dc to [facility], from SNF)                Permission sought to share information with:  Case Manager, Customer service manager, Family Supports Permission granted to share information::  Yes, Verbal Permission Granted  Name::        Agency::  SNF:  [facility]  Relationship::  Diane (daughter)  Sport and exercise psychologist Information:     Housing/Transportation Living arrangements for the past 2 months:  [place], [place] of Information:  Patient, Medical Team Patient Interpreter Needed:  None Criminal Activity/Legal Involvement Pertinent to Current Situation/Hospitalization:  No - Comment as needed Significant Relationships:  Adult Children, Other Family Members, Spouse Lives with:  Spouse Do you feel safe going back to the place where you live?  Yes Need for family participation in patient care:  Yes (Comment)  Care giving concerns:  Patient is a readmission and recently placed in SNF: [facility] on 6/1. He reports no issues with care and plans to return for ST rehab.  He was placed from [facility].  No family at the bedside, patient has given permission to speak with daughter Dustin [name].   Social Worker assessment / plan:  LCSW met with patient and completed updated assessment. No changes in care of disposition as he will return to [place] Per his report. LCSW will update FL2 and assist with disposition back to facility. Patient does have Blue Medicare, new authorization will be needed prior to return.    Plan: SNF at DC  Employment status:  Retired Nurse, adult PT Recommendations:  [place] / Referral to community resources:  [place]  Patient/Family's Response to care:  Agreeable to return to SNF  LCSW spoke with patient daughter Dustin [name] who reports plan will be to return to [facility] when medically stable. She has no concerns at this time. Patient wife and other daughter on the way to see patient at the hospital.  Patient/Family's Understanding of and Emotional Response to Diagnosis, Current Treatment, and Prognosis:  Patient concerned with pain in his leg and why he did not have orange juice with breakfast this morning. He is limited in giving information about his care and treatment plan.  LCSW requested to speak with family to verify treatment and dc plan.   Emotional Assessment Appearance:  Appears stated age Attitude/Demeanor/Rapport:  Other (Cooperative and open to assessment) Affect (typically observed):  Accepting, Adaptable, Pleasant Orientation:  Oriented to Self, Oriented to Place Alcohol / Substance use:  Not Applicable Psych involvement (Current and /or in the community):  No (Comment)  Discharge Needs  Concerns to be addressed:  No discharge needs identified Readmission within the last 30 days:  Yes Current discharge risk:  None Barriers to Discharge:  Continued Medical Work up, [place], [place], LCSW 06/01/2016, 10:02 AM

## 2016-06-01 NOTE — Progress Notes (Signed)
Physical Therapy Treatment Patient Details Name: BELAL SCALLON MRN: [id number] DOB: October 15, 1926 Today's Date: 06/01/2016    History of Present Illness Pt. admitted 05/29/16 with PMH of HTN, CAD,h/o CABG, AAA  s/p stent, CHF, CIPD, pulmonary fibrosis on home O2.  Was DC'd from [facility] on 05/23/16 following IM nail L hip with TDWB.  Pt. went to [facility] for rehabilitation.  [facility]. [facility] with encephalopathy, acute kidney failure on CKD3, AECOPD.  Pt. now has rectal tube    PT Comments    Maximove utilized for bed to recliner. Pt tolerated well. Pt encouraged to perform ankle pumps throughout the day due to significant edema BLE distally.  Follow Up Recommendations  SNF     Equipment Recommendations  None recommended by PT    Recommendations for Other Services       Precautions / Restrictions Precautions Precautions: Fall Restrictions LLE Weight Bearing: Partial weight bearing LLE Partial Weight Bearing Percentage or Pounds: 50%    Mobility  Bed Mobility     Rolling: Total assist;+2 for physical assistance         General bed mobility comments: verbal cues for sequencing, +rail  Transfers                 General transfer comment: Maximove utilized to assist pt OOB to recliner. Pt tolerated transfer well. Pt with c/o feeling lightheaded after being positioned in recliner. BP taken and found to be 207/117. RN in room and notified.  Ambulation/Gait             General Gait Details: unable   Stairs            Wheelchair Mobility    Modified Rankin (Stroke Patients Only)       Balance                                    Cognition Arousal/Alertness: Awake/alert Behavior During Therapy: Flat affect Overall Cognitive Status: Impaired/Different from baseline Area of Impairment: Attention;Following commands;Problem solving   Current Attention Level: Selective Memory: Decreased recall of precautions;Decreased  short-term memory Following Commands: Follows one step commands with increased time     Problem Solving: Slow processing;Decreased initiation;Difficulty sequencing;Requires verbal cues      Exercises General Exercises - Lower Extremity Ankle Circles/Pumps: AROM;Both;10 reps    General Comments        Pertinent Vitals/Pain Pain Assessment: 0-10 Pain Score: 5  Pain Location: LLE Pain Descriptors / Indicators: Sore;Guarding;Grimacing Pain Intervention(s): Limited activity within patient's tolerance;Monitored during session;Repositioned    Home Living                      Prior Function            PT Goals (current goals can now be found in the care plan section) Acute Rehab PT Goals Patient Stated Goal: none stated PT Goal Formulation: Patient unable to participate in goal setting Time For Goal Achievement: 06/14/16 Potential to Achieve Goals: Fair Progress towards PT goals: Progressing toward goals    Frequency  Min 3X/week    PT Plan Current plan remains appropriate    Co-evaluation             End of Session   Activity Tolerance: Treatment limited secondary to medical complications (Comment) (increased BP) Patient left: in chair;with call bell/phone within reach;with chair alarm set  Time: 1136-1200 PT Time Calculation (min) (ACUTE ONLY): 24 min  Charges:  $Therapeutic Activity: 23-37 mins                    G Codes:      Ilda FoilGarrow, Dashanae Longfield Rene 06/01/2016, 1:18 PM

## 2016-06-01 NOTE — Progress Notes (Signed)
S: Ate a little bit of food today O:BP 183/61 mmHg  Pulse 71  Temp(Src) 98.3 F (36.8 C) (Oral)  Resp 16  Wt 90.538 kg (199 lb 9.6 oz)  SpO2 95%  Intake/Output Summary (Last 24 hours) at 06/01/16 1054 Last data filed at 06/01/16 0800  Gross per 24 hour  Intake   1523 ml  Output   2150 ml  Net   -627 ml   Weight change: 0.136 kg (4.8 oz) EAV:WUJWJGen:Awake and alert XBJ:YNWGN,FAOZHCVS:irreg,irreg Resp:clear Abd:+ BS Mild tenderness upper abd, no rebound or guarding Ext: 1+ edema NEURO:CNI Ox3    . arformoterol  15 mcg Nebulization BID  . budesonide (PULMICORT) nebulizer solution  0.5 mg Nebulization BID  . diltiazem  300 mg Oral Daily  . enoxaparin (LOVENOX) injection  30 mg Subcutaneous Q24H  . insulin aspart  0-9 Units Subcutaneous TID WC  . ipratropium-albuterol  3 mL Nebulization TID  . potassium chloride  40 mEq Oral Once  . [START ON 06/02/2016] predniSONE  20 mg Oral Q breakfast   Dg Femur Port Min 2 Views Left  05/31/2016  CLINICAL DATA:  ORIF left femur fracture EXAM: LEFT FEMUR PORTABLE 2 VIEWS COMPARISON:  05/18/2016 FINDINGS: Internal fixation across the left femoral intertrochanteric fracture. Lesser trochanter remains displaced. Otherwise near anatomic alignment. No hardware complicating feature. IMPRESSION: Internal fixation across the left intertrochanteric fracture. Electronically Signed   By: Charlett NoseKevin  Dover M.D.   On: 05/31/2016 15:37   BMET    Component Value Date/Time   NA 142 06/01/2016 0509   K 3.3* 06/01/2016 0509   K 3.4 06/28/2014   CL 104 06/01/2016 0509   CO2 28 06/01/2016 0509   GLUCOSE 155* 06/01/2016 0509   BUN 96* 06/01/2016 0509   CREATININE 2.72* 06/01/2016 0509   CREATININE 1.2 08/29/2015   CALCIUM 7.9* 06/01/2016 0509   GFRNONAA 20* 06/01/2016 0509   GFRAA 23* 06/01/2016 0509   CBC    Component Value Date/Time   WBC 23.3* 06/01/2016 0509   RBC 3.62* 06/01/2016 0509   HGB 10.0* 06/01/2016 0509   HCT 30.6* 06/01/2016 0509   PLT 257 06/01/2016 0509   MCV 84.5 06/01/2016 0509   MCH 27.6 06/01/2016 0509   MCHC 32.7 06/01/2016 0509   RDW 17.0* 06/01/2016 0509   LYMPHSABS 1.9 05/29/2016 1740   MONOABS 0.6 05/29/2016 1740   EOSABS 0.0 05/29/2016 1740   BASOSABS 0.0 05/29/2016 1740     Assessment: 1. ARF on CKD 3 ? Sec volume depletion and bladder outlet obstruction. UO excellent and Scr trending down 2. Hypokalemia 3. HTN Plan: 1.  Replace K as you have done   2. Will sign off as expect renal fx to cont to improve.  He will need have foley DC'd and given a voiding trial when he is a little stronger, if fails then urology should see.  If further renal issues call us back. 3. Avoid ACE/ARB's   [place] Chagnon T

## 2016-06-01 NOTE — Progress Notes (Addendum)
PROGRESS NOTE    Dustin [name]  ZOX:[id number]  DOB: 1926/08/23  DOA: 05/29/2016 PCP: Crawford Givens, MD Outpatient Specialists:   Hospital course: This is a 82 y/o male with a past medical history for hypertension, CAD and CHF who [facility] on 05/29/2016 from his nursing home. The patient was encephalopathic on my exam to the point where he was unable to provide a consistent history so this history is obtained primarily from chart review and discussion with other providers. He was just discharged from our from [facility] on 05/23/2016 after he had a hip fracture requiring left intramedullary nailing. He presented with COPD exacerbation and acute on chronic renal failure.  Initially admitted to ICU and subsequently transferred to floor when he started improving.   Assessment & Plan: Acute COPD Exacerbation - clinically improving, wean steroids.   Acute on Chronic stage 3 renal failure - creatinine trending down, appreciate nephrology consult, repeat BMP in AM Hypokalemia - replete with kdur 40 meq now.  Pseudomonas UTI - treat with cipro 250 mg po BID.  Hypertension - added IV hydralazine prn.  Home diltiazem.  Acute Encephalopathy (metabolic)  - resolved and back to baseline Chronic CHF - compensated and stable at this time.  Monitor I/O.  Restart lasix soon. CAD - no chest pain at this time.  Hyperglycemia - likely secondary to stress reaction and prediabetes as a1c is 6.0.  Continue accu-cheks.  Anemia without bleeding - monitoring hemoglobin closely.  Post op s/p recent IM nail hip - continue lovenox for DVT prophylaxis.   DVT prophylaxis: lovenox Code Status: Full  Family Communication:  Disposition Plan: to SNF  Consultants:  nephrology  Subjective: Pt says that he is breathing much better  Objective: Filed Vitals:   05/31/16 2245 06/01/16 0108 06/01/16 0629 06/01/16 0814  BP: 185/56 199/70 181/64 192/60  Pulse: 85 43 78 73   Temp: 98.2 F (36.8 C) 98.2 F (36.8 C) 98.3 F (36.8 C)   TempSrc: Oral Oral Oral   Resp: Weight:   199 lb 9.6 oz (90.538 kg)   SpO2: 95% 92% 95%     Intake/Output Summary (Last 24 hours) at 06/01/16 0844 Last data filed at 06/01/16 0700  Gross per 24 hour  Intake   1523 ml  Output   2300 ml  Net   -777 ml   Filed Weights   05/30/16 0400 05/31/16 0500 06/01/16 0629  Weight: 196 lb 6.9 oz (89.1 kg) 199 lb 4.8 oz (90.402 kg) 199 lb 9.6 oz (90.538 kg)    Exam:  General: Chronically ill-appearing male,  Neuro: awake, alert, nonfocal HEENT: Normocephalic/atraumatic mucous membranes are dry Cardiovascular: s1 s2 RRR Lungs: much improved, mostly clear Abdomen: bs improved, no r/g, soft Musculoskeletal: Normal bulk and tone Skin: Significant ankle and pedal edema bilaterally  Data Reviewed: Basic Metabolic Panel:  Recent Labs Lab 05/30/16 0531 05/30/16 1635 05/31/16 0327 05/31/16 1622 06/01/16 0509  NA 135  135 140 145 141 142  K 5.4*  5.4* 4.3 3.3* 3.2* 3.3*  CL 97*  100* 101 104 103 104  CO2 GLUCOSE 167*  164* 207* 170* 190* 155*  BUN 126*  131* 133* 122* 103* 96*  CREATININE 5.96*  6.25* 5.86* 4.32* 3.07* 2.72*  CALCIUM 8.2*  8.2* 8.1* 8.1* 8.1* 7.9*  MG 2.6*  --   --   --   --   PHOS  5.7*  6.3*  --  6.4*  --   --    Liver Function Tests:  Recent Labs Lab 05/29/16 1740 05/30/16 0531 05/31/16 0327  AST 20  --   --   ALT 19  --   --   ALKPHOS 81  --   --   BILITOT 0.4  --   --   PROT 5.8*  --   --   ALBUMIN 2.4* 2.0* 1.9*   No results for input(s): LIPASE, AMYLASE in the last 168 hours. No results for input(s): AMMONIA in the last 168 hours. CBC:  Recent Labs Lab 05/29/16 1740 05/29/16 2027 05/29/16 2330 05/30/16 0531 05/31/16 0327 06/01/16 0509  WBC 21.2*  --  17.5* 16.5* 16.2* 23.3*  NEUTROABS 18.7*  --   --   --   --   --   HGB 10.1* 10.5* 9.0* 7.9* 7.0* 10.0*  HCT 30.7* 31.0* 27.5* 24.1*  22.1* 30.6*  MCV 86.2  --  84.1 83.4 86.0 84.5  PLT 294  --  281 276 281 257   Cardiac Enzymes: No results for input(s): CKTOTAL, CKMB, CKMBINDEX, TROPONINI in the last 168 hours. BNP (last 3 results)  Recent Labs  09/11/15 1652  PROBNP 117.0*   CBG:  Recent Labs Lab 05/31/16 0651 05/31/16 1151 05/31/16 1655 05/31/16 2028 06/01/16 0626  GLUCAP 172* 137* 252* 192* 139*    Recent Results (from the past 240 hour(s))  Urine culture     Status: Abnormal   Collection Time: 05/29/16 10:00 PM  Result Value Ref Range Status   Specimen Description URINE, RANDOM  Final   Special Requests NONE  Final   Culture >=100,000 COLONIES/mL PSEUDOMONAS AERUGINOSA (A)  Final   Report Status 06/01/2016 FINAL  Final   Organism ID, Bacteria PSEUDOMONAS AERUGINOSA (A)  Final      Susceptibility   Pseudomonas aeruginosa - MIC*    CEFTAZIDIME <=1 SENSITIVE Sensitive     CIPROFLOXACIN <=0.25 SENSITIVE Sensitive     GENTAMICIN <=1 SENSITIVE Sensitive     IMIPENEM 1 SENSITIVE Sensitive     PIP/TAZO <=4 SENSITIVE Sensitive     CEFEPIME <=1 SENSITIVE Sensitive     * >=100,000 COLONIES/mL PSEUDOMONAS AERUGINOSA  MRSA PCR Screening     Status: None   Collection Time: 05/29/16 10:44 PM  Result Value Ref Range Status   MRSA by PCR NEGATIVE NEGATIVE Final    Comment:        The GeneXpert MRSA Assay (FDA approved for NASAL specimens only), is one component of a comprehensive MRSA colonization surveillance program. It is not intended to diagnose MRSA infection nor to guide or monitor treatment for MRSA infections.      Studies: Dg Femur Port Min 2 Views Left  05/31/2016  CLINICAL DATA:  ORIF left femur fracture EXAM: LEFT FEMUR PORTABLE 2 VIEWS COMPARISON:  05/18/2016 FINDINGS: Internal fixation across the left femoral intertrochanteric fracture. Lesser trochanter remains displaced. Otherwise near anatomic alignment. No hardware complicating feature. IMPRESSION: Internal fixation across the left  intertrochanteric fracture. Electronically Signed   By: Charlett Nose M.D.   On: 05/31/2016 15:37     Scheduled Meds: . arformoterol  15 mcg Nebulization BID  . budesonide (PULMICORT) nebulizer solution  0.5 mg Nebulization BID  . diltiazem  300 mg Oral Daily  . enoxaparin (LOVENOX) injection  30 mg Subcutaneous Q24H  . insulin aspart  0-9 Units Subcutaneous TID WC  . ipratropium-albuterol  3 mL Nebulization TID  . potassium chloride  40 mEq Oral Once  . [START ON 06/02/2016] predniSONE  20 mg Oral Q breakfast   Continuous Infusions:   Active Problems:   AKI (acute kidney injury) (HCC)   Acute respiratory failure with hypoxia (HCC)   Hyperkalemia   Sepsis (HCC)   Time spent:    Standley Dakinslanford Johnson, MD, FAAFP [facility] Pager [phone]  If 7PM-7AM, please contact night-coverage [email or web address] Password [email or web address] 06/01/2016, 8:44 AM    LOS: 3 days

## 2016-06-02 DIAGNOSIS — B965 Pseudomonas (aeruginosa) (mallei) (pseudomallei) as the cause of diseases classified elsewhere: Secondary | ICD-10-CM

## 2016-06-02 DIAGNOSIS — N390 Urinary tract infection, site not specified: Secondary | ICD-10-CM

## 2016-06-02 LAB — BASIC METABOLIC PANEL
ANION GAP: 6 (ref 5–15)
BUN: 81 mg/dL — ABNORMAL HIGH (ref 6–20)
CALCIUM: 7.8 mg/dL — AB (ref 8.9–10.3)
CHLORIDE: 107 mmol/L (ref 101–111)
CO2: 29 mmol/L (ref 22–32)
CREATININE: 1.83 mg/dL — AB (ref 0.61–1.24)
GFR calc non Af Amer: 32 mL/min — ABNORMAL LOW (ref >60)
GFR, EST AFRICAN AMERICAN: 37 mL/min — AB (ref >60)
Glucose, Bld: 171 mg/dL — ABNORMAL HIGH (ref 65–99)
Potassium: 3.7 mmol/L (ref 3.5–5.1)
SODIUM: 142 mmol/L (ref 135–145)

## 2016-06-02 LAB — HEMOGLOBIN AND HEMATOCRIT, BLOOD
HEMATOCRIT: 33.2 % — AB (ref 39.0–52.0)
Hemoglobin: 10.6 g/dL — ABNORMAL LOW (ref 13.0–17.0)

## 2016-06-02 LAB — GLUCOSE, CAPILLARY
GLUCOSE-CAPILLARY: 232 mg/dL — AB (ref 65–99)
Glucose-Capillary: 161 mg/dL — ABNORMAL HIGH (ref 65–99)
Glucose-Capillary: 212 mg/dL — ABNORMAL HIGH (ref 65–99)
Glucose-Capillary: 210 mg/dL — ABNORMAL HIGH (ref 65–99)

## 2016-06-02 MED ORDER — ASPIRIN EC 81 MG PO TBEC
81.0000 mg | DELAYED_RELEASE_TABLET | Freq: Every day | ORAL | Status: DC
  Administered 2016-06-02 – 2016-06-03 (×2): 81 mg via ORAL
  Filled 2016-06-02: qty 1

## 2016-06-02 MED ORDER — FUROSEMIDE 20 MG PO TABS: 20.0000 mg | ORAL_TABLET | Freq: Two times a day (BID) | ORAL | Status: DC

## 2016-06-02 MED ORDER — ALLOPURINOL 300 MG PO TABS
300.0000 mg | ORAL_TABLET | Freq: Every day | ORAL | Status: DC
  Administered 2016-06-03: 300 mg via ORAL
  Filled 2016-06-02: qty 1

## 2016-06-02 MED ORDER — ATORVASTATIN CALCIUM 20 MG PO TABS
20.0000 mg | ORAL_TABLET | Freq: Every day | ORAL | Status: DC
  Administered 2016-06-02 – 2016-06-03 (×2): 20 mg via ORAL
  Filled 2016-06-02 (×2): qty 1

## 2016-06-02 MED ORDER — FUROSEMIDE 20 MG PO TABS
20.0000 mg | ORAL_TABLET | Freq: Two times a day (BID) | ORAL | Status: DC
  Administered 2016-06-02 – 2016-06-03 (×3): 20 mg via ORAL
  Filled 2016-06-02 (×3): qty 1

## 2016-06-02 MED ORDER — SACCHAROMYCES BOULARDII 250 MG PO CAPS
250.0000 mg | ORAL_CAPSULE | Freq: Two times a day (BID) | ORAL | Status: DC
  Administered 2016-06-02 – 2016-06-03 (×3): 250 mg via ORAL
  Filled 2016-06-02 (×2): qty 1

## 2016-06-02 MED ORDER — POTASSIUM CHLORIDE CRYS ER 20 MEQ PO TBCR
20.0000 meq | EXTENDED_RELEASE_TABLET | Freq: Two times a day (BID) | ORAL | Status: DC
  Administered 2016-06-02 – 2016-06-03 (×2): 20 meq via ORAL
  Filled 2016-06-02 (×2): qty 1

## 2016-06-02 NOTE — Progress Notes (Addendum)
PROGRESS NOTE    Dustin [name]  ZOX:[id number]  DOB: 02/11/1926  DOA: 05/29/2016 PCP: Crawford GivensGraham Duncan, MD Outpatient Specialists:   Hospital course: This is a 82 y/o male with a past medical history for hypertension, CAD and CHF who [facility]29/2016 from his nursing home. The patient was encephalopathic on my exam to the point where he was unable to provide a consistent history so this history is obtained primarily from chart review and discussion with other providers. He was just [facility] on 05/[facility]/2016 after he had a hip fracture requiring left intramedullary nailing. He presented with COPD exacerbation and acute on chronic renal failure.  Initially admitted to ICU and subsequently transferred to floor when he started improving.   Assessment & Plan: Acute COPD Exacerbation - clinically improving, wean steroids.   Acute on Chronic stage 3 renal failure - creatinine trending down, appreciate nephrology consult.   Hypokalemia - repleted.  Pseudomonas UTI - treat with cipro 250 mg po BID. DC foley.  Do post-void residual testing.  Hypertension - slightly improved. added IV hydralazine prn.  Home diltiazem.  Acute Encephalopathy (metabolic)  - resolved and back to baseline Chronic CHF - compensated and stable at this time.  Monitor I/O.  Restart low dose lasix.  CAD - no chest pain at this time.  Hyperglycemia - likely secondary to stress reaction and prediabetes as a1c is 6.0.  Continue accu-cheks.  Anemia without bleeding - monitoring hemoglobin closely.  Post op s/p recent IM nail hip - continue lovenox for DVT prophylaxis.   DVT prophylaxis: lovenox Code Status: Full  Family Communication:  Disposition Plan: to SNF when bed available  Consultants:  nephrology  Subjective: Pt says that he is breathing much better  Objective: Filed Vitals:   06/01/16 1943 06/02/16 0553 06/02/16 0836 06/02/16 1010  BP:  160/46 161/61  161/61  Pulse: 73 78    Temp: 97.8 F (36.6 C) 97.8 F (36.6 C)    TempSrc: Oral Oral    Resp: 16 16    Weight:  189 lb (85.73 kg)    SpO2: 94% 97% 95%     Intake/Output Summary (Last 24 hours) at 06/02/16 1134 Last data filed at 06/02/16 0853  Gross per 24 hour  Intake    600 ml  Output   1700 ml  Net  -1100 ml   Filed Weights   05/31/16 0500 06/01/16 0629 06/02/16 0553  Weight: 199 lb 4.8 oz (90.402 kg) 199 lb 9.6 oz (90.538 kg) 189 lb (85.73 kg)    Exam:  General: Chronically ill-appearing male,  Neuro: awake, alert, nonfocal HEENT: Normocephalic/atraumatic mucous membranes are dry Cardiovascular: s1 s2 RRR Lungs: much improved, mostly clear Abdomen: bs improved, no r/g, soft Musculoskeletal: Normal bulk and tone Skin: Significant ankle and pedal edema bilaterally  Data Reviewed: Basic Metabolic Panel:  Recent Labs Lab 05/30/16 0531 05/30/16 1635 05/31/16 0327 05/31/16 1622 06/01/16 0509 06/01/16 0930 06/02/16 0316  NA 135  135 140 145 141 142  --  142  K 5.4*  5.4* 4.3 3.3* 3.2* 3.3*  --  3.7  CL 97*  100* 101 104 103 104  --  107  CO2 23  22 23 27 27 28   --  29  GLUCOSE 167*  164* 207* 170* 190* 155*  --  171*  BUN 126*  131* 133* 122* 103* 96*  --  81*  CREATININE 5.96*  6.25* 5.86* 4.32*  3.07* 2.72*  --  1.83*  CALCIUM 8.2*  8.2* 8.1* 8.1* 8.1* 7.9*  --  7.8*  MG 2.6*  --   --   --   --  2.5*  --   PHOS 5.7*  6.3*  --  6.4*  --   --   --   --    Liver Function Tests:  Recent Labs Lab 05/29/16 1740 05/30/16 0531 05/31/16 0327  AST 20  --   --   ALT 19  --   --   ALKPHOS 81  --   --   BILITOT 0.4  --   --   PROT 5.8*  --   --   ALBUMIN 2.4* 2.0* 1.9*   No results for input(s): LIPASE, AMYLASE in the last 168 hours. No results for input(s): AMMONIA in the last 168 hours. CBC:  Recent Labs Lab 05/29/16 1740  05/29/16 2330 05/30/16 0531 05/31/16 0327 06/01/16 0509 06/02/16 0316  WBC 21.2*  --  17.5*  16.5* 16.2* 23.3*  --   NEUTROABS 18.7*  --   --   --   --   --   --   HGB 10.1*  < > 9.0* 7.9* 7.0* 10.0* 10.6*  HCT 30.7*  < > 27.5* 24.1* 22.1* 30.6* 33.2*  MCV 86.2  --  84.1 83.4 86.0 84.5  --   PLT 294  --  281 276 281 257  --   < > = values in this interval not displayed. Cardiac Enzymes: No results for input(s): CKTOTAL, CKMB, CKMBINDEX, TROPONINI in the last 168 hours. BNP (last 3 results)  Recent Labs  09/11/15 1652  PROBNP 117.0*   CBG:  Recent Labs Lab 06/01/16 0626 06/01/16 1121 06/01/16 1703 06/01/16 2108 06/02/16 0622  GLUCAP 139* 168* 181* 180* 232*    Recent Results (from the past 240 hour(s))  Urine culture     Status: Abnormal   Collection Time: 05/29/16 10:00 PM  Result Value Ref Range Status   Specimen Description URINE, RANDOM  Final   Special Requests NONE  Final   Culture >=100,000 COLONIES/mL PSEUDOMONAS AERUGINOSA (A)  Final   Report Status 06/01/2016 FINAL  Final   Organism ID, Bacteria PSEUDOMONAS AERUGINOSA (A)  Final      Susceptibility   Pseudomonas aeruginosa - MIC*    CEFTAZIDIME <=1 SENSITIVE Sensitive     CIPROFLOXACIN <=0.25 SENSITIVE Sensitive     GENTAMICIN <=1 SENSITIVE Sensitive     IMIPENEM 1 SENSITIVE Sensitive     PIP/TAZO <=4 SENSITIVE Sensitive     CEFEPIME <=1 SENSITIVE Sensitive     * >=100,000 COLONIES/mL PSEUDOMONAS AERUGINOSA  MRSA PCR Screening     Status: None   Collection Time: 05/29/16 10:44 PM  Result Value Ref Range Status   MRSA by PCR NEGATIVE NEGATIVE Final    Comment:        The GeneXpert MRSA Assay (FDA approved for NASAL specimens only), is one component of a comprehensive MRSA colonization surveillance program. It is not intended to diagnose MRSA infection nor to guide or monitor treatment for MRSA infections.      Studies: Dg Femur Port Min 2 Views Left  05/31/2016  CLINICAL DATA:  ORIF left femur fracture EXAM: LEFT FEMUR PORTABLE 2 VIEWS COMPARISON:  05/18/2016 FINDINGS: Internal fixation  across the left femoral intertrochanteric fracture. Lesser trochanter remains displaced. Otherwise near anatomic alignment. No hardware complicating feature. IMPRESSION: Internal fixation across the left intertrochanteric fracture. Electronically Signed   By: Caryn Bee  Dover M.D.   On: 05/31/2016 15:37    Scheduled Meds: . arformoterol  15 mcg Nebulization BID  . budesonide (PULMICORT) nebulizer solution  0.5 mg Nebulization BID  . ciprofloxacin  250 mg Oral BID  . diltiazem  300 mg Oral Daily  . enoxaparin (LOVENOX) injection  30 mg Subcutaneous Q24H  . insulin aspart  0-9 Units Subcutaneous TID WC  . potassium chloride  40 mEq Oral Once  . predniSONE  20 mg Oral Q breakfast   Continuous Infusions:   Active Problems:   Pseudomonas urinary tract infection   AKI (acute kidney injury) (HCC)   Acute respiratory failure with hypoxia (HCC)   Hyperkalemia   Sepsis (HCC)  Time spent:   Standley Dakins, MD, FAAFP [facility] Pager [phone]  If 7PM-7AM, please contact night-coverage [email or web address] Password [email or web address] 06/02/2016, 11:34 AM    LOS: 4 days

## 2016-06-03 DIAGNOSIS — J441 Chronic obstructive pulmonary disease with (acute) exacerbation: Secondary | ICD-10-CM | POA: Diagnosis present

## 2016-06-03 DIAGNOSIS — R339 Retention of urine, unspecified: Secondary | ICD-10-CM | POA: Diagnosis present

## 2016-06-03 LAB — GLUCOSE, CAPILLARY
GLUCOSE-CAPILLARY: 129 mg/dL — AB (ref 65–99)
Glucose-Capillary: 140 mg/dL — ABNORMAL HIGH (ref 65–99)
Glucose-Capillary: 183 mg/dL — ABNORMAL HIGH (ref 65–99)

## 2016-06-03 MED ORDER — CIPROFLOXACIN HCL 250 MG PO TABS: 250.0000 mg | ORAL_TABLET | Freq: Two times a day (BID) | ORAL | Status: DC

## 2016-06-03 MED ORDER — ASPIRIN 81 MG PO TBEC: 81.0000 mg | DELAYED_RELEASE_TABLET | Freq: Every day | ORAL | Status: AC

## 2016-06-03 MED ORDER — POTASSIUM CHLORIDE CRYS ER 20 MEQ PO TBCR: 20.0000 meq | EXTENDED_RELEASE_TABLET | Freq: Every day | ORAL | Status: DC

## 2016-06-03 MED ORDER — HYDRALAZINE HCL 20 MG/ML IJ SOLN
20.0000 mg | INTRAMUSCULAR | Status: DC | PRN
  Administered 2016-06-03: 20 mg via INTRAVENOUS
  Filled 2016-06-03: qty 1

## 2016-06-03 MED ORDER — ACETAMINOPHEN 500 MG PO TABS: 500.0000 mg | ORAL_TABLET | Freq: Four times a day (QID) | ORAL | Status: AC | PRN

## 2016-06-03 MED ORDER — FUROSEMIDE 20 MG PO TABS: 20.0000 mg | ORAL_TABLET | Freq: Two times a day (BID) | ORAL | Status: DC

## 2016-06-03 NOTE — Progress Notes (Signed)
Report called to Misti Cox, [facility] receiving nurse . All questions answered. Spouse Melanee SpryJane Oravec called and updated. Awaiting transportation.

## 2016-06-03 NOTE — Progress Notes (Signed)
Transferred out via stretcher by [facility]AR in no acute distress; awake, alert, and conversant.

## 2016-06-03 NOTE — Progress Notes (Signed)
Pt has not voided since catheter was removed. He has been bladder scanned twice and urine volume was less than 100cc. PO fluids have been given. Pt states he does not have any sensation of a full bladder. His penis is swollen and tender.

## 2016-06-03 NOTE — Progress Notes (Signed)
Coude 16 fr urinary catheter inserted. 1300 ml yellow, cloudy urine returned. Pt verbalized relief upon catheter insertion.

## 2016-06-03 NOTE — Progress Notes (Signed)
Removed rectal tube and staples to Left leg. Tolerated.

## 2016-06-03 NOTE — Progress Notes (Signed)
Physical Therapy Treatment Patient Details Name: Dustin [name] MRN: [id number] DOB: 12/13/1926 Today's Date: 06/03/2016    History of Present Illness Pt. admitted 05/29/16 with PMH of HTN, CAD,h/o CABG, AAA  s/p stent, CHF, CIPD, pulmonary fibrosis on home O2.  Was DC'd from [facility] on 05/23/16 following IM nail L hip with TDWB.  [facility]  Pt. [facility] with encephalopathy, acute kidney failure on CKD3, AECOPD.  Pt. now has rectal tube    PT Comments    Session limited by bladder discomfort and nurse awaiting call back from MD.  Pt was able to roll with less A today and worked well with therex.  Con't to recommend SNF for rehab.  Follow Up Recommendations  SNF     Equipment Recommendations  None recommended by PT    Recommendations for Other Services       Precautions / Restrictions Precautions Precautions: Fall Restrictions Weight Bearing Restrictions: Yes LLE Weight Bearing: Partial weight bearing LLE Partial Weight Bearing Percentage or Pounds: 50%    Mobility  Bed Mobility Overal bed mobility: Needs Assistance Bed Mobility: Rolling Rolling: Max assist         General bed mobility comments: Pt able to roll L with MAX of 1 with cues for hand placement and use of rail.    Transfers                 General transfer comment: deferred due to full bladder and awaiting further instructions from MD per nursing. Nurse requests he stays in bed.  Ambulation/Gait                 Stairs            Wheelchair Mobility    Modified Rankin (Stroke Patients Only)       Balance                                    Cognition Arousal/Alertness: Awake/alert Behavior During Therapy: Flat affect Overall Cognitive Status: No family/caregiver present to determine baseline cognitive functioning         Following Commands: Follows one step commands consistently;Follows one step commands with  increased time       General Comments: Nursing reports they just tried to place 2 catheters, but were unable. Pt very uncomfortable with bladder.    Exercises General Exercises - Lower Extremity Ankle Circles/Pumps: AROM;Both;10 reps Quad Sets: Both;5 reps;Supine Gluteal Sets: 5 reps;Supine Heel Slides: AAROM;Left;10 reps;Supine Hip ABduction/ADduction: AAROM;Left;10 reps;Supine    General Comments        Pertinent Vitals/Pain Pain Assessment: 0-10 Pain Score: 8  Pain Location: bladder Pain Descriptors / Indicators: Sore;Pressure Pain Intervention(s): Limited activity within patient's tolerance;Monitored during session;Repositioned    Home Living                      Prior Function            PT Goals (current goals can now be found in the care plan section) Acute Rehab PT Goals Patient Stated Goal: to empty bladder PT Goal Formulation: Patient unable to participate in goal setting Time For Goal Achievement: 06/14/16 Potential to Achieve Goals: Fair Progress towards PT goals: Progressing toward goals    Frequency  Min 3X/week    PT Plan Current plan remains appropriate    Co-evaluation  End of Session   Activity Tolerance: Treatment limited secondary to medical complications (Comment) Patient left: in bed;with call bell/phone within reach;with bed alarm set     Time: 1012-1026 PT Time Calculation (min) (ACUTE ONLY): 14 min  Charges:  $Therapeutic Exercise: 8-22 mins                    G Codes:      Dustin [name] 06/03/2016, 11:21 AM

## 2016-06-03 NOTE — Evaluation (Signed)
Clinical/Bedside Swallow Evaluation Patient Details  Name: Dustin [name] MRN: [id number] Date of Birth: Mar 27, 1926  Today's Date: 06/03/2016 Time: SLP Start Time (ACUTE ONLY): 1336 SLP Stop Time (ACUTE ONLY): 1356 SLP Time Calculation (min) (ACUTE ONLY): 20 min  Past Medical History:  Past Medical History  Diagnosis Date  . Hyperlipidemia   . Hypertension   . Abdominal aortic aneurysm (HCC)     with endoleak per Dr. Janetta Hora, aneurysmal sac size stable as of 12/11 (7.5 x 6.1cm)  . Benign prostatic hypertrophy   . History of chickenpox   . Dysuria   . Heart disease   . Gout   . Sigmoid diverticulosis   . Melanoma in situ [facility])     Per [facility] 2014  . MI (myocardial infarction) (HCC)   . Coronary artery disease   . Pulmonary fibrosis (HCC)   . CHF (congestive heart failure) (HCC)   . Emphysema lung [facility])    Past Surgical History:  Past Surgical History  Procedure Laterality Date  . Hernia repair  2011    Right  . Coronary artery bypass graft  1991  . Bilateral cataract surgery  2011  . Aaa stent  2008  . Left cea  1997  . Intramedullary (im) nail intertrochanteric Left 05/18/2016    Procedure: INTRAMEDULLARY (IM) NAIL INTERTROCHANTRIC;  Surgeon: Jodi Geralds, MD;  Location: [facility] OR;  Service: Orthopedics;  Laterality: Left;   HPI:  This is a 82 y/o male with a past medical history for hypertension, CAD and CHF who [facility] on 05/29/2016 from his nursing home. The patient was encephalopathic on my exam to the point where he was unable to provide a consistent history so this history is obtained primarily from chart review and discussion with other providers. He was just discharged from our from [facility] on 05/23/2016 after he had a hip fracture requiring left intramedullary nailing. He presented with COPD exacerbation and acute on chronic renal failure. Initially admitted to ICU and subsequently transferred to floor  when he started improving. Pt c/o difficulty swallowing since intubation from previous hospitalization.   Assessment / Plan / Recommendation Clinical Impression  Pt referred for a clinical assessment of swallow function due to complaints of difficulty swallowing since intubation for hip repair at the beginning  of the month. Pt was partially reclined due to inability to tolerate fully upright position in bed. Pt observed with thin and puree (pt declined solids, but reported that he needs soft foods). Pt demonstrated delayed wet cough with large straw sip of thin. When instructed to take very small sips, cough was eliminated and pt felt like swallow was much improved. Discussed importance of safe swallow performance including upright positioning as much as possible to decrease risk of aspiration. Pt verbalized understanding. Recommend: Continue with Dys 2 diet with thins. SMALL sips/bites. Upright positioning with all PO. Meds whole with puree. Reviewed precautions with pt and RN. Pt to be discharged to SNF this afternoon- anticipate pt would benefit from ongoing SLP services to implement safe swallow precautions and address improved respiratory support.     Aspiration Risk  Moderate aspiration risk    Diet Recommendation Dysphagia 2 (Fine chop);Thin liquid   Liquid Administration via: Cup;Straw Medication Administration: Whole meds with puree Supervision: Patient able to self feed;Intermittent supervision to cue for compensatory strategies Compensations: Small sips/bites Postural Changes: Seated upright at 90 degrees    Other  Recommendations Oral Care Recommendations: Oral care BID  Follow up Recommendations  Skilled Nursing facility    Frequency and Duration            Prognosis Prognosis for Safe Diet Advancement: Fair      Swallow Study   General Date of Onset: 06/02/16 HPI: This is a 82 y/o male with a past medical history for hypertension, CAD and CHF who [facility] on 05/29/2016 from his nursing home. The patient was encephalopathic on my exam to the point where he was unable to provide a consistent history so this history is obtained primarily from chart review and discussion with other providers. He was just [facility]2016 after he had a hip fracture requiring left intramedullary nailing. He presented with COPD exacerbation and acute on chronic renal failure. Initially admitted to ICU and subsequently transferred to floor when he started improving. Pt c/o difficulty swallowing since intubation from previous hospitalization. Type of Study: Bedside Swallow Evaluation Previous Swallow Assessment: none Diet Prior to this Study: Dysphagia 2 (chopped);Thin liquids Temperature Spikes Noted: No Respiratory Status: Room air History of Recent Intubation: Yes (for surgery at the beginning of the month) Behavior/Cognition: Alert;Cooperative;Pleasant mood Oral Cavity Assessment: Within Functional Limits Oral Care Completed by SLP: No Oral Cavity - Dentition: Adequate natural dentition Vision: Functional for self-feeding Self-Feeding Abilities: Able to feed self Patient Positioning: Partially reclined (Pt unable to tolerate fully upright positioning) Baseline Vocal Quality: Normal Volitional Cough: Weak Volitional Swallow: Able to elicit    Oral/Motor/Sensory Function Overall Oral Motor/Sensory Function: Within functional limits   Ice Chips     Thin Liquid Thin Liquid: Impaired Presentation: Cup;Straw Pharyngeal  Phase Impairments: Cough - Delayed (with large bolus of thin)    Nectar Thick Nectar Thick Liquid: Not tested   Honey Thick Honey Thick Liquid: Not tested   Puree Puree: Within functional limits   Solid   GO   Solid:  (pt declined solids)        Rocky CraftsKara E Joliyah Lippens MA, CCC-SLP Pager [phone] 06/03/2016,2:15 PM

## 2016-06-03 NOTE — Care Management Important Message (Signed)
Important Message  Patient Details  Name: Dustin [name] MRN: [id number] Date of Birth: 12/10/1926   Medicare Important Message Given:  Yes    Bernadette HoitShoffner, Shardea Cwynar Coleman 06/03/2016, 1:55 PM

## 2016-06-03 NOTE — Discharge Summary (Addendum)
Physician Discharge Summary  Dustin [name]:[id number] DOB: December 23, 1926 DOA: 05/29/2016  PCP: Crawford Givens, MD  Admit date: 05/29/2016 Discharge date: 06/03/2016  Disposition:  SNF  Recommendations for Outpatient Follow-up:  1. Follow up with PCP in 1-2 weeks  Followup with Dr. Luiz Blare in 1 week (orthopedic surgery) 2. Please obtain BMP/CBC in one week 3. TED Hoses and SCDs for DVT prophylaxis per orthopedic surgery 4. Please make a follow up appointment for him with a urologist in 1-2 weeks.   Discharge Condition: Stable CODE STATUS: Full  Diet Recommendation Dysphagia 2 (Fine chop);Thin liquid   Liquid Administration via: Cup;Straw Medication Administration: Whole meds with puree Supervision: Patient able to self feed;Intermittent supervision to cue for compensatory strategies Compensations: Small sips/bites Postural Changes: Seated upright at 90 degrees    Other Recommendations Oral Care Recommendations: Oral care BID           Brief/Interim Summary: Hospital course: This is a 82 y/o male with a past medical history for hypertension, CAD and CHF who presented to [facility] emergency department on 05/29/2016 from his nursing home. The patient was encephalopathic on my exam to the point where he was unable to provide a consistent history so this history is obtained primarily from chart review and discussion with other providers. He was just discharged from our from [facility] on 05/23/2016 after he had a hip fracture requiring left intramedullary nailing. He presented with COPD exacerbation and acute on chronic renal failure. Initially admitted to ICU and subsequently transferred to floor when he started improving.   Assessment & Plan: Acute COPD Exacerbation - clinically improving, wean steroids.  Acute on Chronic stage 3 renal failure - creatinine trending down, appreciate nephrology consult. Avoid ARB/ACE per nephrology.  Hypokalemia - repleted.   Pseudomonas UTI - treat with cipro 250 mg po BID x 7d.  Urinary Retention - Pt failed trial of foley out, replace foley on 6/12. Pt needs outpatient appt with urologist in 1-2 weeks.   Hypertension - slightly improved. Home diltiazem. Avoid ACE/ARB per nephrology. Follow up outpatient for further management.  Acute Encephalopathy (metabolic) - resolved and back to baseline Chronic CHF - compensated and stable at this time. Monitor I/O. Restart low dose lasix.  CAD - no chest pain at this time.  Hyperglycemia - likely secondary to stress reaction and prediabetes as a1c is 6.0. Continue accu-cheks 1x per day.  Anemia without bleeding - monitoring hemoglobin closely.  Post op s/p recent IM nail hip - SCDs and TED Hoses DVT prophylaxis per orthopedics.  Asa stopped due to renal failure.  Followup with Dr. Luiz Blare in 1 week.    Discharge Diagnoses:  Active Problems:   Pseudomonas urinary tract infection   AKI (acute kidney injury) (HCC)   Acute respiratory failure with hypoxia (HCC)   Hyperkalemia   Sepsis (HCC)   Urinary retention   Acute exacerbation of chronic obstructive pulmonary disease (COPD) (HCC)  Discharge Instructions    Medication List    STOP taking these medications        HYDROcodone-acetaminophen 5-325 MG tablet  Commonly known as:  NORCO      TAKE these medications        acetaminophen 500 MG tablet  Commonly known as:  TYLENOL  Take 1 tablet (500 mg total) by mouth every 6 (six) hours as needed for mild pain, fever or headache.     allopurinol 300 MG tablet  Commonly known as:  ZYLOPRIM  Take 300 mg by  mouth daily. Reported on 05/29/2016     ascorbic acid 1000 MG tablet  Commonly known as:  VITAMIN C  Take 1,000 mg by mouth daily.     aspirin 81 MG EC tablet  Take 1 tablet (81 mg total) by mouth daily.     atorvastatin 20 MG tablet  Commonly known as:  LIPITOR  Take 1 tablet (20 mg total) by mouth daily at 6 PM.     ciprofloxacin 250 MG tablet   Commonly known as:  CIPRO  Take 1 tablet (250 mg total) by mouth 2 (two) times daily.  Start taking on:  06/08/2016     clotrimazole-betamethasone cream  Commonly known as:  LOTRISONE  Apply 1 application topically 2 (two) times daily as needed. For itching from proteinuria     diltiazem 300 MG 24 hr capsule  Commonly known as:  TIAZAC  Take 300 mg by mouth daily. Reported on 05/29/2016     docusate sodium 100 MG capsule  Commonly known as:  COLACE  Take 200 mg by mouth daily.     doxazosin 8 MG tablet  Commonly known as:  CARDURA  Take 8 mg by mouth daily. Reported on 05/29/2016     dutasteride 0.5 MG capsule  Commonly known as:  AVODART  Take 0.5 mg by mouth at bedtime. Reported on 05/29/2016     furosemide 20 MG tablet  Commonly known as:  LASIX  Take 1 tablet (20 mg total) by mouth 2 (two) times daily.     potassium chloride SA 20 MEQ tablet  Commonly known as:  K-DUR,KLOR-CON  Take 1 tablet (20 mEq total) by mouth daily. Take with Lasix     saccharomyces boulardii 250 MG capsule  Commonly known as:  FLORASTOR  Take 250 mg by mouth 2 (two) times daily. Start date 05-29-16 per MAR, For 10 days     VITAMIN D3 PO  Take 1,000 Units by mouth daily. Reported on 05/29/2016     vitamin E 1000 UNIT capsule  Take 1,000 Units by mouth daily.       Follow-up Information    Follow up with Crawford Givens, MD. Schedule an appointment as soon as possible for a visit in 1 week.   Specialty:  Family Medicine   Why:  hospital followup   Contact information:   [address] [place] [address] [phone]       Follow up with GRAVES,JOHN L, MD. Schedule an appointment as soon as possible for a visit in 1 week.   Specialty:  Orthopedic Surgery   Why:  hospital follow up    Contact information:   [address] [place] [address] [phone]       Follow up with [facility]. Schedule an appointment as soon as possible for a visit in 1 week.   Why:   hospital follow up urinary retention   Contact information:   [address]  [place] [address] [place] [address] [phone]      No Known Allergies  Consultations:  Nephrology  orthopedics  Procedures/Studies: Dg Chest 1 View  05/18/2016  CLINICAL DATA:  Dizziness after getting out of bed and falling. EXAM: CHEST 1 VIEW COMPARISON:  09/08/2015. FINDINGS: Normal sized heart. Small amount of linear density at the left lung base. Stable prominence of the interstitial markings, hyper expansion of the lungs and diffuse peribronchial thickening. Diffuse osteopenia. Old, healed right humeral neck fracture. Post CABG changes. IMPRESSION: 1. Minimal  linear atelectasis or scarring at the left lung base. 2. Stable changes of COPD and chronic bronchitis. Electronically Signed   By: Beckie Salts M.D.   On: 05/18/2016 11:53   Ct Head Wo Contrast  05/18/2016  CLINICAL DATA:  Status post fall, left hip pain EXAM: CT HEAD WITHOUT CONTRAST TECHNIQUE: Contiguous axial images were obtained from the base of the skull through the vertex without intravenous contrast. COMPARISON:  None. FINDINGS: There is no evidence of mass effect, midline shift, or extra-axial fluid collections. There is no evidence of a space-occupying lesion or intracranial hemorrhage. There is no evidence of a cortical-based area of acute infarction. 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 There is generalized cerebral atrophy. There is periventricular white matter low attenuation likely secondary to microangiopathy. The ventricles and sulci are appropriate for the patient's age. The basal cisterns are patent. Visualized portions of the orbits are unremarkable. There is right maxillary sinus mucosal thickening. Cerebrovascular atherosclerotic calcifications are noted. The osseous structures are unremarkable. IMPRESSION: No acute intracranial pathology. Electronically Signed   By: Elige Ko   On: 05/18/2016 12:16   US  Renal  05/29/2016  CLINICAL DATA:  Acute kidney injury. EXAM: RENAL / URINARY TRACT ULTRASOUND COMPLETE COMPARISON:  05/20/2016 FINDINGS: Right Kidney: Length: 10.8 cm (stable). Stable echogenic appearance of the renal cortex and mild atrophy. No hydronephrosis. Left Kidney: Length: 11.4 cm (stable). Stable echogenic renal cortex with mild atrophy. No hydronephrosis. Stable septated renal cyst measuring approximately 6 cm in greatest dimensions. Bladder: Distended bladder contains a mild amount of dependent debris. IMPRESSION: Stable bilateral echogenic kidneys with mild atrophy. Findings are suggestive of chronic kidney disease. No evidence of hydronephrosis. A distended bladder contains a mild amount of dependent debris. Electronically Signed   By: Irish Lack M.D.   On: 05/29/2016 21:58   US Renal  05/20/2016  CLINICAL DATA:  82 year old hypertensive male with acute renal failure. Initial encounter. EXAM: RENAL / URINARY TRACT ULTRASOUND COMPLETE COMPARISON:  None. FINDINGS: Right Kidney: Length: 10.2 cm. Mild renal parenchymal thinning. Echogenic renal parenchyma. Findings may indicate changes of medical renal disease. No hydronephrosis or mass noted. Left Kidney: Length: 10.7 cm. 7.1 x 3 x 5.6 cm bilobed cyst mid to upper aspect of the left kidney. Mild renal parenchymal thinning. Echogenic renal parenchyma. Findings may indicate changes of medical renal disease. Bladder: Foley catheter in place and bladder decompressed with evaluation therefore limited. IMPRESSION: No hydronephrosis. Mild bilateral renal parenchymal thinning and increased parenchymal echogenicity. Findings may indicate changes of medical renal disease. Large bilobed left renal cyst measuring up to 7.1 cm. Electronically Signed   By: Lacy Duverney M.D.   On: 05/20/2016 15:46   Dg Chest Portable 1 View  05/29/2016  CLINICAL DATA:  82 year old male with shortness of breath. EXAM: PORTABLE CHEST 1 VIEW COMPARISON:  Chest radiograph dated  05/27 FINDINGS: Single-view of the chest demonstrates emphysematous changes of the lungs with bibasilar linear atelectasis/scarring. There is no focal consolidation, pleural effusion, or pneumothorax. The cardiac silhouette is within normal limits. Median sternotomy wires and bypass ring is noted. There is atherosclerotic calcification of the aortic arch. Degenerative changes of the spine and shoulders. Old healed right posterior rib fractures noted. No acute fracture. IMPRESSION: No active disease. Electronically Signed   By: Elgie Collard M.D.   On: 05/29/2016 18:05   Dg C-arm 61-120 Min  05/18/2016  CLINICAL DATA:  Patient status post ORIF left femur fracture. EXAM: DG C-ARM 61-120 MIN;  LEFT FEMUR 2 VIEWS COMPARISON:  Hip radiograph 05/18/2016. FINDINGS: Patient status post intra medullary rod and screw fixation of comminuted intratrochanteric left femur fracture. Improved anatomic alignment at the fracture site. Four intraoperative fluoroscopic images were submitted. IMPRESSION: Patient status post intra medullary rod fixation comminuted proximal left femur fracture. Electronically Signed   By: Annia Belt M.D.   On: 05/18/2016 18:47   Dg Hip Unilat With Pelvis 2-3 Views Left  05/18/2016  CLINICAL DATA:  Left hip pain after or getting out of bed and falling this morning. EXAM: DG HIP (WITH OR WITHOUT PELVIS) 2-3V LEFT COMPARISON:  None. FINDINGS: Comminuted left intertrochanteric fracture with varus angulation. Diffuse osteopenia. Bilateral iliac artery stents. Atheromatous arterial calcifications. IMPRESSION: Comminuted left intertrochanteric fracture with varus angulation. Electronically Signed   By: Beckie Salts M.D.   On: 05/18/2016 11:55   Dg Femur Min 2 Views Left  05/18/2016  CLINICAL DATA:  Patient status post ORIF left femur fracture. EXAM: DG C-ARM 61-120 MIN; LEFT FEMUR 2 VIEWS COMPARISON:  Hip radiograph 05/18/2016. FINDINGS: Patient status post intra medullary rod and screw fixation of  comminuted intratrochanteric left femur fracture. Improved anatomic alignment at the fracture site. Four intraoperative fluoroscopic images were submitted. IMPRESSION: Patient status post intra medullary rod fixation comminuted proximal left femur fracture. Electronically Signed   By: Annia Belt M.D.   On: 05/18/2016 18:47   Dg Femur Port Min 2 Views Left  05/31/2016  CLINICAL DATA:  ORIF left femur fracture EXAM: LEFT FEMUR PORTABLE 2 VIEWS COMPARISON:  05/18/2016 FINDINGS: Internal fixation across the left femoral intertrochanteric fracture. Lesser trochanter remains displaced. Otherwise near anatomic alignment. No hardware complicating feature. IMPRESSION: Internal fixation across the left intertrochanteric fracture. Electronically Signed   By: Charlett Nose M.D.   On: 05/31/2016 15:37    Subjective: Pt says he is breathing better.    Discharge Exam: Filed Vitals:   06/02/16 2300 06/03/16 0501  BP:  180/71  Pulse:  74  Temp:  97.6 F (36.4 C)  Resp: 20 20   Filed Vitals:   06/02/16 2046 06/02/16 2300 06/03/16 0501 06/03/16 0818  BP: 158/54  180/71   Pulse: 72  74   Temp: 98.4 F (36.9 C)  97.6 F (36.4 C)   TempSrc: Axillary  Oral   Resp: 18 20 20    Weight:   196 lb (88.905 kg)   SpO2: 100%  98% 96%   General: Chronically ill-appearing male,  Neuro: awake, alert, nonfocal HEENT: Normocephalic/atraumatic mucous membranes are dry Cardiovascular: s1 s2 RRR Lungs: much improved, mostly clear Abdomen: bs improved, no r/g, soft Musculoskeletal: Normal bulk and tone Skin: Significant ankle and pedal edema bilaterally  The results of significant diagnostics from this hospitalization (including imaging, microbiology, ancillary and laboratory) are listed below for reference.     Microbiology: Recent Results (from the past 240 hour(s))  Urine culture     Status: Abnormal   Collection Time: 05/29/16 10:00 PM  Result Value Ref Range Status   Specimen Description URINE,  RANDOM  Final   Special Requests NONE  Final   Culture >=100,000 COLONIES/mL PSEUDOMONAS AERUGINOSA (A)  Final   Report Status 06/01/2016 FINAL  Final   Organism ID, Bacteria PSEUDOMONAS AERUGINOSA (A)  Final      Susceptibility   Pseudomonas aeruginosa - MIC*    CEFTAZIDIME <=1 SENSITIVE Sensitive     CIPROFLOXACIN <=0.25 SENSITIVE Sensitive     GENTAMICIN <=1 SENSITIVE Sensitive     IMIPENEM 1 SENSITIVE Sensitive  PIP/TAZO <=4 SENSITIVE Sensitive     CEFEPIME <=1 SENSITIVE Sensitive     * >=100,000 COLONIES/mL PSEUDOMONAS AERUGINOSA  MRSA PCR Screening     Status: None   Collection Time: 05/29/16 10:44 PM  Result Value Ref Range Status   MRSA by PCR NEGATIVE NEGATIVE Final    Comment:        The GeneXpert MRSA Assay (FDA approved for NASAL specimens only), is one component of a comprehensive MRSA colonization surveillance program. It is not intended to diagnose MRSA infection nor to guide or monitor treatment for MRSA infections.      Labs: BNP (last 3 results)  Recent Labs  09/08/15 1808 05/29/16 1740  BNP 109.1* 132.8*   Basic Metabolic Panel:  Recent Labs Lab 05/30/16 0531 05/30/16 1635 05/31/16 0327 05/31/16 1622 06/01/16 0509 06/01/16 0930 06/02/16 0316  NA 135  135 140 145 141 142  --  142  K 5.4*  5.4* 4.3 3.3* 3.2* 3.3*  --  3.7  CL 97*  100* 101 104 103 104  --  107  CO2 23  22 23 27 27 28   --  29  GLUCOSE 167*  164* 207* 170* 190* 155*  --  171*  BUN 126*  131* 133* 122* 103* 96*  --  81*  CREATININE 5.96*  6.25* 5.86* 4.32* 3.07* 2.72*  --  1.83*  CALCIUM 8.2*  8.2* 8.1* 8.1* 8.1* 7.9*  --  7.8*  MG 2.6*  --   --   --   --  2.5*  --   PHOS 5.7*  6.3*  --  6.4*  --   --   --   --    Liver Function Tests:  Recent Labs Lab 05/29/16 1740 05/30/16 0531 05/31/16 0327  AST 20  --   --   ALT 19  --   --   ALKPHOS 81  --   --   BILITOT 0.4  --   --   PROT 5.8*  --   --   ALBUMIN 2.4* 2.0* 1.9*   No results for input(s):  LIPASE, AMYLASE in the last 168 hours. No results for input(s): AMMONIA in the last 168 hours. CBC:  Recent Labs Lab 05/29/16 1740  05/29/16 2330 05/30/16 0531 05/31/16 0327 06/01/16 0509 06/02/16 0316  WBC 21.2*  --  17.5* 16.5* 16.2* 23.3*  --   NEUTROABS 18.7*  --   --   --   --   --   --   HGB 10.1*  < > 9.0* 7.9* 7.0* 10.0* 10.6*  HCT 30.7*  < > 27.5* 24.1* 22.1* 30.6* 33.2*  MCV 86.2  --  84.1 83.4 86.0 84.5  --   PLT 294  --  281 276 281 257  --   < > = values in this interval not displayed. Cardiac Enzymes: No results for input(s): CKTOTAL, CKMB, CKMBINDEX, TROPONINI in the last 168 hours. BNP: Invalid input(s): POCBNP CBG:  Recent Labs Lab 06/02/16 1132 06/02/16 1606 06/02/16 2044 06/03/16 0702 06/03/16 1117  GLUCAP 161* 212* 210* 140* 129*   D-Dimer No results for input(s): DDIMER in the last 72 hours. Hgb A1c No results for input(s): HGBA1C in the last 72 hours. Lipid Profile No results for input(s): CHOL, HDL, LDLCALC, TRIG, CHOLHDL, LDLDIRECT in the last 72 hours. Thyroid function studies No results for input(s): TSH, T4TOTAL, T3FREE, THYROIDAB in the last 72 hours.  Invalid input(s): FREET3 Anemia work up No results for input(s): VITAMINB12, FOLATE,  FERRITIN, TIBC, IRON, RETICCTPCT in the last 72 hours. Urinalysis    Component Value Date/Time   COLORURINE YELLOW 05/29/2016 2201   APPEARANCEUR TURBID* 05/29/2016 2201   LABSPEC 1.013 05/29/2016 2201   PHURINE 6.5 05/29/2016 2201   GLUCOSEU NEGATIVE 05/29/2016 2201   HGBUR MODERATE* 05/29/2016 2201   HGBUR large 01/25/2011 0930   BILIRUBINUR NEGATIVE 05/29/2016 2201   BILIRUBINUR neg 08/09/2014 1455   KETONESUR NEGATIVE 05/29/2016 2201   PROTEINUR 100* 05/29/2016 2201   PROTEINUR 1+ 08/09/2014 1455   UROBILINOGEN 0.2 08/09/2014 1455   UROBILINOGEN 0.2 01/25/2011 0930   NITRITE NEGATIVE 05/29/2016 2201   NITRITE Neg 08/09/2014 1455   LEUKOCYTESUR LARGE* 05/29/2016 2201   Sepsis  Labs Invalid input(s): PROCALCITONIN,  WBC,  LACTICIDVEN Microbiology Recent Results (from the past 240 hour(s))  Urine culture     Status: Abnormal   Collection Time: 05/29/16 10:00 PM  Result Value Ref Range Status   Specimen Description URINE, RANDOM  Final   Special Requests NONE  Final   Culture >=100,000 COLONIES/mL PSEUDOMONAS AERUGINOSA (A)  Final   Report Status 06/01/2016 FINAL  Final   Organism ID, Bacteria PSEUDOMONAS AERUGINOSA (A)  Final      Susceptibility   Pseudomonas aeruginosa - MIC*    CEFTAZIDIME <=1 SENSITIVE Sensitive     CIPROFLOXACIN <=0.25 SENSITIVE Sensitive     GENTAMICIN <=1 SENSITIVE Sensitive     IMIPENEM 1 SENSITIVE Sensitive     PIP/TAZO <=4 SENSITIVE Sensitive     CEFEPIME <=1 SENSITIVE Sensitive     * >=100,000 COLONIES/mL PSEUDOMONAS AERUGINOSA  MRSA PCR Screening     Status: None   Collection Time: 05/29/16 10:44 PM  Result Value Ref Range Status   MRSA by PCR NEGATIVE NEGATIVE Final    Comment:        The GeneXpert MRSA Assay (FDA approved for NASAL specimens only), is one component of a comprehensive MRSA colonization surveillance program. It is not intended to diagnose MRSA infection nor to guide or monitor treatment for MRSA infections.    Time coordinating discharge: Over 36 minutes  SIGNED:  Standley Dakins, MD  [facility] 06/03/2016, 12:54 PM Pager   If 7PM-7AM, please contact night-coverage [email or web address] Password [email or web address]

## 2016-06-03 NOTE — Progress Notes (Signed)
Patient unable to void previous shift per report. Bladder scan resulted in 1000 ml. Unsuccessful attempts x 2 to replace foley catheter using both 16 fr and 14 fr. Dr Laural BenesJohnson paged and made aware.

## 2016-06-03 NOTE — Discharge Instructions (Signed)
Acute Respiratory Distress Syndrome °Acute respiratory distress syndrome is a life-threatening condition in which fluid collects in the lungs. This condition can cause severe shortness of breath and low oxygen levels in the blood. It can also cause the lungs and other vital organs to fail. The condition usually develops within 24 to 48 hours of an infection, illness, surgery, or injury. °CAUSES °This condition may be caused by: °· An infection, such as sepsis or pneumonia. °· A serious injury to the head or chest. °· A major surgery. °· A drug overdose. °· Breathing in harmful chemicals or smoke. °· Blood transfusions. °· A blood clot in the lungs. °SYMPTOMS °Symptoms of this condition include: °· Fever. °· Difficulty breathing. °· Bluish skin (cyanosis). °· A fast or irregular heartbeat. °· Low blood pressure (hypotension). °· Agitation. °· Confusion. °· Lack of energy (lethargy). °· Sweating. °DIAGNOSIS °This condition may be diagnosed by using tests to rule out other diseases and conditions that cause similar symptoms. You may have: °· A chest X-ray or CT scan. °· Blood tests. °· A sputum culture. In this test, you will be asked to spit so that a sample of lung fluid can be taken for testing. °· Bronchoscopy. During this test a thin, flexible tool is passed into the mouth or nose, down the windpipe, and into the lungs. °TREATMENT °This condition may be treated with: °· Oxygen. A breathing machine (ventilator) is often used to provide oxygen and help with breathing. °· Medicine to help you relax (sedative). °· Fluids and nutrients given through an IV tube. °· Blood pressure medicine. °· Steroid medicine to help decrease inflammation in the lungs. °· Diuretic medicine to get rid of extra fluid in the body. °Additional treatment may be needed, depending on the cause of the condition. It can take up to 12 months to recover from this condition. Some people recover fully, but others may continue to  have: °· Weakness. °· Shortness of breath. °· Memory problems. °· Depression. °HOME CARE INSTRUCTIONS °Until you recover from this condition: °· Do not smoke. °· Limit alcohol intake to no more than 1 drink per day for nonpregnant women and 2 drinks per day for men. One drink equals 12 oz of beer, 5 oz of wine, or 1½ oz of hard liquor. °· Ask friends and family to help you if daily activities make you tired. °SEEK MEDICAL CARE IF: °· You become short of breath with activity or while resting. °· You develop a cough that does not go away. °· You have a fever. °SEEK IMMEDIATE MEDICAL CARE IF: °· You have sudden shortness of breath. °· You develop chest pain that does not go away. °· You develop swelling or pain in one of your legs. °· You cough up blood. °  °This information is not intended to replace advice given to you by your health care provider. Make sure you discuss any questions you have with your health care provider. °  °Document Released: 12/09/2005 Document Revised: 04/25/2015 Document Reviewed: 12/05/2014 °Elsevier Interactive Patient Education ©2016 [employer] ° °

## 2016-06-03 NOTE — Clinical Social Work Note (Signed)
CSW received insurance authorization. [facility].  RN and patient notified of discharge. RN given phone number for report and transport packet is on patient's chart. Ambulance transport requested. CSW signing off.   Valero EnergyShonny Aylani Spurlock, LCSW [phone]

## 2016-06-06 ENCOUNTER — Encounter: Payer: Self-pay | Admitting: Internal Medicine

## 2016-06-06 ENCOUNTER — Non-Acute Institutional Stay (SKILLED_NURSING_FACILITY): Payer: Medicare Other | Admitting: Internal Medicine

## 2016-06-06 DIAGNOSIS — I5033 Acute on chronic diastolic (congestive) heart failure: Secondary | ICD-10-CM | POA: Diagnosis not present

## 2016-06-06 DIAGNOSIS — R339 Retention of urine, unspecified: Secondary | ICD-10-CM

## 2016-06-06 DIAGNOSIS — D638 Anemia in other chronic diseases classified elsewhere: Secondary | ICD-10-CM

## 2016-06-06 DIAGNOSIS — N390 Urinary tract infection, site not specified: Secondary | ICD-10-CM

## 2016-06-06 DIAGNOSIS — Z951 Presence of aortocoronary bypass graft: Secondary | ICD-10-CM | POA: Diagnosis not present

## 2016-06-06 DIAGNOSIS — J449 Chronic obstructive pulmonary disease, unspecified: Secondary | ICD-10-CM

## 2016-06-06 DIAGNOSIS — R5381 Other malaise: Secondary | ICD-10-CM

## 2016-06-06 DIAGNOSIS — N289 Disorder of kidney and ureter, unspecified: Secondary | ICD-10-CM

## 2016-06-06 DIAGNOSIS — S72002S Fracture of unspecified part of neck of left femur, sequela: Secondary | ICD-10-CM

## 2016-06-06 DIAGNOSIS — E785 Hyperlipidemia, unspecified: Secondary | ICD-10-CM

## 2016-06-06 DIAGNOSIS — R600 Localized edema: Secondary | ICD-10-CM

## 2016-06-06 DIAGNOSIS — B965 Pseudomonas (aeruginosa) (mallei) (pseudomallei) as the cause of diseases classified elsewhere: Secondary | ICD-10-CM

## 2016-06-06 DIAGNOSIS — K5900 Constipation, unspecified: Secondary | ICD-10-CM | POA: Diagnosis not present

## 2016-06-06 DIAGNOSIS — R1310 Dysphagia, unspecified: Secondary | ICD-10-CM

## 2016-06-06 NOTE — Progress Notes (Signed)
Patient ID: Dustin [name], male   DOB: 01/24/1926, 82 y.o.   MRN: [id number]     LOCATION:   PCP: Crawford Givens, MD   Code Status: full code   Goals of care: Advanced Directive information Advanced Directives 06/06/2016  Does patient have an advance directive? No  Type of Advance Directive -  Does patient want to make changes to advanced directive? No - Patient declined  Would patient like information on creating an advanced directive? No - patient declined information       Extended Emergency Contact Information Primary Emergency Contact: Girvan,Jane Address: [address] [place], [place] [address] [place] Home Phone: [phone] Mobile Phone: [phone] Relation: Spouse Secondary Emergency Contact: Wilson,Diane Address: [address]          [place], [place] [address] [place] Home Phone: [phone] Mobile Phone: [phone] Relation: Daughter   No Known Allergies  Chief Complaint  Patient presents with  . Readmit To SNF     HPI:  Patient is a 82 y.o. male seen today for short term rehabilitation post hospital re-admission from 05/29/16-06/03/16 with copd exacerbation and acute renal failure. He received steroids and antibiotics. He was also treated for pseudomonas UTI. He had acute urinary retention, required foley catheter and failed the voiding trial. Of note, patient was here undergoing rehabilitation post IM nailing for left hip fracture on 05/18/16 prior to this hospital admission. He is seen in his room today. His pain is under control at present. He feels weak and tired.   Review of Systems:  Constitutional: Negative for fever, chills, diaphoresis. Feels weak and tired. HENT: Negative for headache, congestion, nasal discharge, sore throat. Positive for difficulty swallowing pills.   Eyes: Negative for blurred vision, double vision and discharge.  Respiratory: Negative for cough, shortness of breath and  wheezing. On 3 liters of oxygen.  Cardiovascular: Negative for chest pain, palpitations, leg swelling.  Gastrointestinal: Negative for nausea, vomiting, abdominal pain, diarrhea and constipation. Appetite is improving. Positive for heartburn.  Genitourinary: Negative for dysuria and flank pain.  Musculoskeletal: Negative for back pain, fall in the facility.  Skin: Negative for itching, rash.  Neurological: Positive for occasional dizziness. Psychiatric/Behavioral: Negative for depression.     Past Medical History  Diagnosis Date  . Hyperlipidemia   . Hypertension   . Abdominal aortic aneurysm (HCC)     with endoleak per Dr. Janetta Hora, aneurysmal sac size stable as of 12/11 (7.5 x 6.1cm)  . Benign prostatic hypertrophy   . History of chickenpox   . Dysuria   . Heart disease   . Gout   . Sigmoid diverticulosis   . Melanoma in situ [facility])     Per [facility] 2014  . MI (myocardial infarction) (HCC)   . Coronary artery disease   . Pulmonary fibrosis (HCC)   . CHF (congestive heart failure) (HCC)   . Emphysema lung [facility])    Past Surgical History  Procedure Laterality Date  . Hernia repair  2011    Right  . Coronary artery bypass graft  1991  . Bilateral cataract surgery  2011  . Aaa stent  2008  . Left cea  1997  . Intramedullary (im) nail intertrochanteric Left 05/18/2016    Procedure: INTRAMEDULLARY (IM) NAIL INTERTROCHANTRIC;  Surgeon: Jodi Geralds, MD;  Location: [facility] OR;  Service: Orthopedics;  Laterality: Left;   Social History:   reports that he quit  smoking about 27 years ago. His smoking use included Cigarettes. He has a 40 pack-year smoking history. He has never used smokeless tobacco. He reports that he drinks alcohol. He reports that he does not use illicit drugs.  Family History  Problem Relation Age of Onset  . Stroke Mother   . Heart disease Father [id number]    Sudden death  . Heart attack Father     Medications:   Medication List       This list  is accurate as of: 06/06/16 12:17 PM.  Always use your most recent med list.               acetaminophen 500 MG tablet  Commonly known as:  TYLENOL  Take 1 tablet (500 mg total) by mouth every 6 (six) hours as needed for mild pain, fever or headache.     allopurinol 300 MG tablet  Commonly known as:  ZYLOPRIM  Take 300 mg by mouth daily. Reported on 05/29/2016     ascorbic acid 1000 MG tablet  Commonly known as:  VITAMIN C  Take 1,000 mg by mouth daily.     aspirin 81 MG EC tablet  Take 1 tablet (81 mg total) by mouth daily.     atorvastatin 20 MG tablet  Commonly known as:  LIPITOR  Take 1 tablet (20 mg total) by mouth daily at 6 PM.     ciprofloxacin 250 MG tablet  Commonly known as:  CIPRO  Take 1 tablet (250 mg total) by mouth 2 (two) times daily.  Start taking on:  06/08/2016     clotrimazole-betamethasone cream  Commonly known as:  LOTRISONE  Apply 1 application topically 2 (two) times daily as needed. For itching from proteinuria     diltiazem 300 MG 24 hr capsule  Commonly known as:  TIAZAC  Take 300 mg by mouth daily. Reported on 05/29/2016     docusate sodium 100 MG capsule  Commonly known as:  COLACE  Take 200 mg by mouth daily.     doxazosin 8 MG tablet  Commonly known as:  CARDURA  Take 8 mg by mouth daily. Reported on 05/29/2016     dutasteride 0.5 MG capsule  Commonly known as:  AVODART  Take 0.5 mg by mouth at bedtime. Reported on 05/29/2016     furosemide 20 MG tablet  Commonly known as:  LASIX  Take 1 tablet (20 mg total) by mouth 2 (two) times daily.     potassium chloride SA 20 MEQ tablet  Commonly known as:  K-DUR,KLOR-CON  Take 1 tablet (20 mEq total) by mouth daily.     saccharomyces boulardii 250 MG capsule  Commonly known as:  FLORASTOR  Take 250 mg by mouth 2 (two) times daily. Start date 05-29-16 per [facility], For 10 days     VITAMIN D3 PO  Take 1,000 Units by mouth daily. Reported on 05/29/2016     vitamin E 1000 UNIT capsule  Take 1,000  Units by mouth daily.        Immunizations: Immunization History  Administered Date(s) Administered  . Influenza Split 10/05/2012  . Influenza,inj,Quad PF,36+ Mos 10/13/2013, 09/27/2014, 09/20/2015  . PPD Test 05/23/2016  . Pneumococcal Polysaccharide-23 12/24/2007  . Zoster 12/23/2008     Physical Exam: Filed Vitals:   06/06/16 1007  BP: 137/57  Pulse: 64  Temp: 96.9 F (36.1 C)  TempSrc: Oral  Resp: 18  Height: 5\' 7"  (1.702 m)  Weight: 196 lb (88.905 kg)  SpO2: 97%   Body mass index is 30.69 kg/(m^2).   General- elderly obese male, in no acute distress Head- normocephalic, atraumatic Nose- no maxillary or frontal sinus tenderness, no nasal discharge Throat- moist mucus membrane Eyes- PERRLA, EOMI, no pallor, no icterus, no discharge, normal conjunctiva, normal sclera Neck- no cervical lymphadenopathy Cardiovascular- normal s1,s2, no murmur, 1+ leg edema Respiratory- bilateral poor air movement, no wheeze, no rhonchi, no crackles, no use of accessory muscles, on o2 Abdomen- bowel sounds present, soft, non tender Musculoskeletal- able to move all 4 extremities, generalized weakness, limited left hip range of motion Neurological- alert and oriented to person and place Skin- warm and dry, left hip surgical incision with staples in place, bruise to both arms, trace edema to both arms Psychiatry- normal mood and affect     Labs reviewed: Basic Metabolic Panel:  Recent Labs  16/10/96 0226  05/23/16 0345  05/30/16 0531  05/31/16 0327 05/31/16 1622 06/01/16 0509 06/01/16 0930 06/02/16 0316  NA 140  < > 140  < > 135  135  < > 145 141 142  --  142  K 4.4  < > 4.1  < > 5.4*  5.4*  < > 3.3* 3.2* 3.3*  --  3.7  CL 108  < > 105  < > 97*  100*  < > 104 103 104  --  107  CO2 28  < > 28  < > 23  22  < > --  29  GLUCOSE 108*  < > 100*  < > 167*  164*  < > 170* 190* 155*  --  171*  BUN 43*  < > 44*  < > 126*  131*  < > 122* 103* 96*  --  81*    CREATININE 2.42*  < > 1.66*  < > 5.96*  6.25*  < > 4.32* 3.07* 2.72*  --  1.83*  CALCIUM 7.4*  < > 8.1*  < > 8.2*  8.2*  < > 8.1* 8.1* 7.9*  --  7.8*  MG 1.9  --   --   --  2.6*  --   --   --   --  2.5*  --   PHOS  --   < > 4.2  --  5.7*  6.3*  --  6.4*  --   --   --   --   < > = values in this interval not displayed. Liver Function Tests:  Recent Labs  05/20/16 0436  05/21/16 0226  05/29/16 1740 05/30/16 0531 05/31/16 0327  AST 17  --  19  --  20  --   --   ALT 6*  --  5*  --  19  --   --   ALKPHOS 48  --  47  --  81  --   --   BILITOT 0.4  --  0.3  --  0.4  --   --   PROT 4.4*  --  4.2*  --  5.8*  --   --   ALBUMIN 2.4*  < > 2.0*  < > 2.4* 2.0* 1.9*  < > = values in this interval not displayed. No results for input(s): LIPASE, AMYLASE in the last 8760 hours. No results for input(s): AMMONIA in the last 8760 hours. CBC:  Recent Labs  05/20/16 0436  05/21/16 0226  05/29/16 1740  05/30/16 0531 05/31/16 0327 06/01/16 0509 06/02/16 0316  WBC 12.7*  < >  10.3  < > 21.2*  < > 16.5* 16.2* 23.3*  --   NEUTROABS 10.0*  --  8.2*  --  18.7*  --   --   --   --   --   HGB 7.8*  < > 8.1*  < > 10.1*  < > 7.9* 7.0* 10.0* 10.6*  HCT 25.1*  < > 25.3*  < > 30.7*  < > 24.1* 22.1* 30.6* 33.2*  MCV 86.9  < > 88.5  < > 86.2  < > 83.4 86.0 84.5  --   PLT 110*  < > 117*  < > 294  < > 276 281 257  --   < > = values in this interval not displayed. Cardiac Enzymes:  Recent Labs  09/08/15 1808 05/20/16 1254  CKTOTAL  --  615*  TROPONINI <0.03  --    BNP: Invalid input(s): POCBNP CBG:  Recent Labs  06/03/16 0702 06/03/16 1117 06/03/16 1630  GLUCAP 140* 129* 183*    Radiological Exams: Dg Chest 1 View  05/18/2016  CLINICAL DATA:  Dizziness after getting out of bed and falling. EXAM: CHEST 1 VIEW COMPARISON:  09/08/2015. FINDINGS: Normal sized heart. Small amount of linear density at the left lung base. Stable prominence of the interstitial markings, hyper expansion of the lungs  and diffuse peribronchial thickening. Diffuse osteopenia. Old, healed right humeral neck fracture. Post CABG changes. IMPRESSION: 1. Minimal linear atelectasis or scarring at the left lung base. 2. Stable changes of COPD and chronic bronchitis. Electronically Signed   By: Beckie Salts M.D.   On: 05/18/2016 11:53   Ct Head Wo Contrast  05/18/2016  CLINICAL DATA:  Status post fall, left hip pain EXAM: CT HEAD WITHOUT CONTRAST TECHNIQUE: Contiguous axial images were obtained from the base of the skull through the vertex without intravenous contrast. COMPARISON:  None. FINDINGS: There is no evidence of mass effect, midline shift, or extra-axial fluid collections. There is no evidence of a space-occupying lesion or intracranial hemorrhage. There is no evidence of a cortical-based area of acute infarction. 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 There is generalized cerebral atrophy. There is periventricular white matter low attenuation likely secondary to microangiopathy. The ventricles and sulci are appropriate for the patient's age. The basal cisterns are patent. Visualized portions of the orbits are unremarkable. There is right maxillary sinus mucosal thickening. Cerebrovascular atherosclerotic calcifications are noted. The osseous structures are unremarkable. IMPRESSION: No acute intracranial pathology. Electronically Signed   By: Elige Ko   On: 05/18/2016 12:16   US Renal  05/29/2016  CLINICAL DATA:  Acute kidney injury. EXAM: RENAL / URINARY TRACT ULTRASOUND COMPLETE COMPARISON:  05/20/2016 FINDINGS: Right Kidney: Length: 10.8 cm (stable). Stable echogenic appearance of the renal cortex and mild atrophy. No hydronephrosis. Left Kidney: Length: 11.4 cm (stable). Stable echogenic renal cortex with mild atrophy. No hydronephrosis. Stable septated renal cyst measuring approximately 6 cm in greatest dimensions. Bladder: Distended bladder contains a mild amount of dependent debris.  IMPRESSION: Stable bilateral echogenic kidneys with mild atrophy. Findings are suggestive of chronic kidney disease. No evidence of hydronephrosis. A distended bladder contains a mild amount of dependent debris. Electronically Signed   By: Irish Lack M.D.   On: 05/29/2016 21:58   US Renal  05/20/2016  CLINICAL DATA:  82 year old hypertensive male with acute renal failure. Initial encounter. EXAM: RENAL / URINARY TRACT ULTRASOUND COMPLETE COMPARISON:  None. FINDINGS: Right Kidney: Length: 10.2 cm. Mild renal parenchymal thinning. Echogenic  renal parenchyma. Findings may indicate changes of medical renal disease. No hydronephrosis or mass noted. Left Kidney: Length: 10.7 cm. 7.1 x 3 x 5.6 cm bilobed cyst mid to upper aspect of the left kidney. Mild renal parenchymal thinning. Echogenic renal parenchyma. Findings may indicate changes of medical renal disease. Bladder: Foley catheter in place and bladder decompressed with evaluation therefore limited. IMPRESSION: No hydronephrosis. Mild bilateral renal parenchymal thinning and increased parenchymal echogenicity. Findings may indicate changes of medical renal disease. Large bilobed left renal cyst measuring up to 7.1 cm. Electronically Signed   By: Lacy Duverney M.D.   On: 05/20/2016 15:46   Dg Chest Portable 1 View  05/29/2016  CLINICAL DATA:  82 year old male with shortness of breath. EXAM: PORTABLE CHEST 1 VIEW COMPARISON:  Chest radiograph dated 05/27 FINDINGS: Single-view of the chest demonstrates emphysematous changes of the lungs with bibasilar linear atelectasis/scarring. There is no focal consolidation, pleural effusion, or pneumothorax. The cardiac silhouette is within normal limits. Median sternotomy wires and bypass ring is noted. There is atherosclerotic calcification of the aortic arch. Degenerative changes of the spine and shoulders. Old healed right posterior rib fractures noted. No acute fracture. IMPRESSION: No active disease. Electronically  Signed   By: Elgie Collard M.D.   On: 05/29/2016 18:05   Dg C-arm 61-120 Min  05/18/2016  CLINICAL DATA:  Patient status post ORIF left femur fracture. EXAM: DG C-ARM 61-120 MIN; LEFT FEMUR 2 VIEWS COMPARISON:  Hip radiograph 05/18/2016. FINDINGS: Patient status post intra medullary rod and screw fixation of comminuted intratrochanteric left femur fracture. Improved anatomic alignment at the fracture site. Four intraoperative fluoroscopic images were submitted. IMPRESSION: Patient status post intra medullary rod fixation comminuted proximal left femur fracture. Electronically Signed   By: Annia Belt M.D.   On: 05/18/2016 18:47   Dg Hip Unilat With Pelvis 2-3 Views Left  05/18/2016  CLINICAL DATA:  Left hip pain after or getting out of bed and falling this morning. EXAM: DG HIP (WITH OR WITHOUT PELVIS) 2-3V LEFT COMPARISON:  None. FINDINGS: Comminuted left intertrochanteric fracture with varus angulation. Diffuse osteopenia. Bilateral iliac artery stents. Atheromatous arterial calcifications. IMPRESSION: Comminuted left intertrochanteric fracture with varus angulation. Electronically Signed   By: Beckie Salts M.D.   On: 05/18/2016 11:55   Dg Femur Min 2 Views Left  05/18/2016  CLINICAL DATA:  Patient status post ORIF left femur fracture. EXAM: DG C-ARM 61-120 MIN; LEFT FEMUR 2 VIEWS COMPARISON:  Hip radiograph 05/18/2016. FINDINGS: Patient status post intra medullary rod and screw fixation of comminuted intratrochanteric left femur fracture. Improved anatomic alignment at the fracture site. Four intraoperative fluoroscopic images were submitted. IMPRESSION: Patient status post intra medullary rod fixation comminuted proximal left femur fracture. Electronically Signed   By: Annia Belt M.D.   On: 05/18/2016 18:47   Dg Femur Port Min 2 Views Left  05/31/2016  CLINICAL DATA:  ORIF left femur fracture EXAM: LEFT FEMUR PORTABLE 2 VIEWS COMPARISON:  05/18/2016 FINDINGS: Internal fixation across the left  femoral intertrochanteric fracture. Lesser trochanter remains displaced. Otherwise near anatomic alignment. No hardware complicating feature. IMPRESSION: Internal fixation across the left intertrochanteric fracture. Electronically Signed   By: Charlett Nose M.D.   On: 05/31/2016 15:37    Assessment/Plan  Physical deconditioning Will have him work with physical therapy and occupational therapy team to help with gait training and muscle strengthening exercises.fall precautions. Skin care. Encourage to be out of bed.   Copd With recent exacerbation, completed his antibiotic and steroid treatment.  Continue avodart. Add duoneb q6h prn for dyspnea/ wheezing  Left hip fracture S/p left IM nailing. Will have him work with physical therapy and occupational therapy team to help with gait training and muscle strengthening exercises.fall precautions. Skin care. Encourage to be out of bed. Continue tylenol 500 mg q6h prn pain. Has orthopedics follow up  Acute renal failure Avoid NSAIDs. Monitor bmp  Pseudomonas UTI Continue and complete 7 days course of ciprofloxacin 250 mg bid, continue foley care. Continue florastor  Acute urinary retention Has failed voiding trial, get urology consult. Continue doxazosin  Anemia of chronic disease Off aspirin 325 mg bid now. Continue to monitor cbc  Leg edema To wear ted hose to help with leg edema. Continue lasix  HLD Continue statin  CAD S/p CABG, continue diltiazem with statin and baby aspirin  Constipation Continue colace 200 mg daily  Diastolic chf Continue furosemide 20 mg bid, monitor bmp, continue kcl  Dysphagia Get SLP consult   Goals of care: short term rehabilitation   Labs/tests ordered: cbc, cmp  Family/ staff Communication: reviewed care plan with patient and nursing supervisor    Oneal Grout, MD Internal Medicine [facility] [address] [place], [place] [address] Cell Phone  (Monday-Friday 8 am - 5 pm): [phone] On Call: [phone] and follow prompts after 5 pm and on weekends Office Phone: [phone] Office Fax: [phone]

## 2016-06-11 ENCOUNTER — Other Ambulatory Visit: Payer: Self-pay | Admitting: *Deleted

## 2016-06-11 MED ORDER — TRAMADOL HCL 50 MG PO TABS: ORAL_TABLET | ORAL | Status: AC

## 2016-06-11 NOTE — Telephone Encounter (Signed)
[facility] 

## 2016-06-12 ENCOUNTER — Non-Acute Institutional Stay (SKILLED_NURSING_FACILITY): Payer: Medicare Other | Admitting: Internal Medicine

## 2016-06-12 ENCOUNTER — Encounter: Payer: Self-pay | Admitting: Internal Medicine

## 2016-06-12 DIAGNOSIS — R195 Other fecal abnormalities: Secondary | ICD-10-CM | POA: Diagnosis not present

## 2016-06-12 DIAGNOSIS — R600 Localized edema: Secondary | ICD-10-CM

## 2016-06-12 DIAGNOSIS — Z789 Other specified health status: Secondary | ICD-10-CM | POA: Diagnosis not present

## 2016-06-12 DIAGNOSIS — R42 Dizziness and giddiness: Secondary | ICD-10-CM | POA: Diagnosis not present

## 2016-06-12 NOTE — Progress Notes (Signed)
Patient ID: Dustin [name], male   DOB: 11-29-1926, 82 y.o.   MRN: [id number]     LOCATION:   PCP: Crawford Givens, MD   Code Status: full code   Goals of care: Advanced Directive information Advanced Directives 06/06/2016  Does patient have an advance directive? No  Type of Advance Directive -  Does patient want to make changes to advanced directive? No - Patient declined  Would patient like information on creating an advanced directive? No - patient declined information       Extended Emergency Contact Information Primary Emergency Contact: Kirschenbaum,Jane Address: [address] [place], [place] [address] [place] Home Phone: [phone] Mobile Phone: [phone] Relation: Spouse Secondary Emergency Contact: Wilson,Diane Address: [address]          [place], [place] [address] [place] Home Phone: [phone] Mobile Phone: [phone] Relation: Daughter   No Known Allergies  Chief Complaint  Patient presents with  . Acute Visit    Increased redness and drainage around surgical incision     HPI:  Patient is a 82 y.o. male seen today for acute visit. Nursing is concerned about drainage from his surgical incision site with redness. Per treatment nurse and orthopedic f/u note, has serosangineous drainage and peri-incision erythema and had staples removed yesterday. He complaints of dizziness this visit with position change and feeling weak and tired. He has loose stool this afternoon while working with therapy. He has urgency for bowel movement with exertion per therapy team and had 2 incidents with them today and yesterday. Per nursing staff, this am had firm bowel movement. No bleed reported.    Review of Systems:  Constitutional: Negative for fever, chills, diaphoresis. Feels weak and tired. HENT: Negative for headache, congestion Eyes: Negative for blurred vision, double vision and discharge.  Respiratory: Negative  for cough, shortness of breath and wheezing. On 3 liters of oxygen.  Cardiovascular: Negative for chest pain, palpitations. has leg swelling.  Gastrointestinal: Negative for nausea, vomiting, abdominal pain Genitourinary: Negative for dysuria and flank pain.  Musculoskeletal: Negative for back pain, fall in the facility.  Skin: Negative for itching, rash.  Neurological: Positive for dizziness. Psychiatric/Behavioral: Negative for depression.     Past Medical History  Diagnosis Date  . Hyperlipidemia   . Hypertension   . Abdominal aortic aneurysm (HCC)     with endoleak per Dr. Janetta Hora, aneurysmal sac size stable as of 12/11 (7.5 x 6.1cm)  . Benign prostatic hypertrophy   . History of chickenpox   . Dysuria   . Heart disease   . Gout   . Sigmoid diverticulosis   . Melanoma in situ [facility])     Per [facility] 2014  . MI (myocardial infarction) (HCC)   . Coronary artery disease   . Pulmonary fibrosis (HCC)   . CHF (congestive heart failure) (HCC)   . Emphysema lung [facility])    Past Surgical History  Procedure Laterality Date  . Hernia repair  2011    Right  . Coronary artery bypass graft  1991  . Bilateral cataract surgery  2011  . Aaa stent  2008  . Left cea  1997  . Intramedullary (im) nail intertrochanteric Left 05/18/2016    Procedure: INTRAMEDULLARY (IM) NAIL INTERTROCHANTRIC;  Surgeon: Jodi Geralds, MD;  Location: [facility] OR;  Service: Orthopedics;  Laterality: Left;   Social History:   reports that he quit smoking about 27  years ago. His smoking use included Cigarettes. He has a 40 pack-year smoking history. He has never used smokeless tobacco. He reports that he drinks alcohol. He reports that he does not use illicit drugs.  Family History  Problem Relation Age of Onset  . Stroke Mother   . Heart disease Father 59    Sudden death  . Heart attack Father     Medications:   Medication List       This list is accurate as of: 06/12/16 12:07 PM.  Always  use your most recent med list.               acetaminophen 500 MG tablet  Commonly known as:  TYLENOL  Take 1 tablet (500 mg total) by mouth every 6 (six) hours as needed for mild pain, fever or headache.     allopurinol 300 MG tablet  Commonly known as:  ZYLOPRIM  Take 300 mg by mouth daily. Reported on 05/29/2016     ascorbic acid 1000 MG tablet  Commonly known as:  VITAMIN C  Take 1,000 mg by mouth daily.     aspirin 81 MG EC tablet  Take 1 tablet (81 mg total) by mouth daily.     atorvastatin 20 MG tablet  Commonly known as:  LIPITOR  Take 1 tablet (20 mg total) by mouth daily at 6 PM.     clotrimazole-betamethasone cream  Commonly known as:  LOTRISONE  Apply 1 application topically 2 (two) times daily as needed. For itching from proteinuria     diltiazem 300 MG 24 hr capsule  Commonly known as:  TIAZAC  Take 300 mg by mouth daily. Reported on 05/29/2016     docusate sodium 100 MG capsule  Commonly known as:  COLACE  Take 200 mg by mouth daily.     doxazosin 8 MG tablet  Commonly known as:  CARDURA  Take 8 mg by mouth daily. Reported on 05/29/2016     dutasteride 0.5 MG capsule  Commonly known as:  AVODART  Take 0.5 mg by mouth at bedtime. Reported on 05/29/2016     furosemide 20 MG tablet  Commonly known as:  LASIX  Take 20 mg by mouth 2 (two) times daily.     ipratropium-albuterol 0.5-2.5 (3) MG/3ML Soln  Commonly known as:  DUONEB  Take 3 mLs by nebulization every 6 (six) hours as needed.     potassium chloride SA 20 MEQ tablet  Commonly known as:  K-DUR,KLOR-CON  Take 1 tablet (20 mEq total) by mouth daily.     traMADol 50 MG tablet  Commonly known as:  ULTRAM  Take one tablet by mouth every 6 hours as needed for pain     VITAMIN D3 PO  Take 1,000 Units by mouth daily. Reported on 05/29/2016     vitamin E 1000 UNIT capsule  Take 1,000 Units by mouth daily.        Immunizations: Immunization History  Administered Date(s) Administered  . Influenza  Split 10/05/2012  . Influenza,inj,Quad PF,36+ Mos 10/13/2013, 09/27/2014, 09/20/2015  . PPD Test 05/23/2016  . Pneumococcal Polysaccharide-23 12/24/2007  . Zoster 12/23/2008     Physical Exam: Filed Vitals:   06/12/16 1158  BP: 152/63  Pulse: 73  Temp: 97.1 F (36.2 C)  TempSrc: Oral  Resp: 20  Height:  (1.702 m)  Weight: 187 lb 6.4 oz (85.004 kg)  SpO2: 97%   Body mass index is 29.34 kg/(m^2).   General- elderly obese male,  in no acute distress Head- normocephalic, atraumatic Nose- no maxillary or frontal sinus tenderness, no nasal discharge Throat- moist mucus membrane Eyes- PERRLA, EOMI, no pallor, no icterus, no discharge, normal conjunctiva, normal sclera Neck- no cervical lymphadenopathy Cardiovascular- normal s1,s2, no murmur, 2+ leg edema Respiratory- bilateral poor air movement, no wheeze, no rhonchi, no crackles, no use of accessory muscles, on o2 Abdomen- bowel sounds present, soft, non tender Musculoskeletal- able to move all 4 extremities, generalized weakness, limited left hip range of motion Neurological- alert and oriented to person and place Skin- warm and dry, left hip surgical incision healing well with minimal drainage, has peri wound erythema, no warmth noted Psychiatry- normal mood and affect     Labs reviewed: Basic Metabolic Panel:  Recent Labs  09/81/19 0226  05/23/16 0345  05/30/16 0531  05/31/16 0327 05/31/16 1622 06/01/16 0509 06/01/16 0930 06/02/16 0316  NA 140  < > 140  < > 135  135  < > 145 141 142  --  142  K 4.4  < > 4.1  < > 5.4*  5.4*  < > 3.3* 3.2* 3.3*  --  3.7  CL 108  < > 105  < > 97*  100*  < > 104 103 104  --  107  CO2 28  < > 28  < > 23  22  < > 27 27 28   --  29  GLUCOSE 108*  < > 100*  < > 167*  164*  < > 170* 190* 155*  --  171*  BUN 43*  < > 44*  < > 126*  131*  < > 122* 103* 96*  --  81*  CREATININE 2.42*  < > 1.66*  < > 5.96*  6.25*  < > 4.32* 3.07* 2.72*  --  1.83*  CALCIUM 7.4*  < > 8.1*  < >  8.2*  8.2*  < > 8.1* 8.1* 7.9*  --  7.8*  MG 1.9  --   --   --  2.6*  --   --   --   --  2.5*  --   PHOS  --   < > 4.2  --  5.7*  6.3*  --  6.4*  --   --   --   --   < > = values in this interval not displayed. Liver Function Tests:  Recent Labs  05/20/16 0436  05/21/16 0226  05/28/16 05/29/16 1740 05/30/16 0531 05/31/16 0327  AST 17  --  19  --  15 20  --   --   ALT 6*  --  5*  --  11 19  --   --   ALKPHOS 48  --  47  --  79 81  --   --   BILITOT 0.4  --  0.3  --   --  0.4  --   --   PROT 4.4*  --  4.2*  --   --  5.8*  --   --   ALBUMIN 2.4*  < > 2.0*  < >  --  2.4* 2.0* 1.9*  < > = values in this interval not displayed. No results for input(s): LIPASE, AMYLASE in the last 8760 hours. No results for input(s): AMMONIA in the last 8760 hours. CBC:  Recent Labs  05/20/16 0436  05/21/16 0226  05/29/16 1740  05/30/16 0531 05/31/16 0327 06/01/16 0509 06/02/16 0316  WBC 12.7*  < > 10.3  < > 21.2*  < >  16.5* 16.2* 23.3*  --   NEUTROABS 10.0*  --  8.2*  --  18.7*  --   --   --   --   --   HGB 7.8*  < > 8.1*  < > 10.1*  < > 7.9* 7.0* 10.0* 10.6*  HCT 25.1*  < > 25.3*  < > 30.7*  < > 24.1* 22.1* 30.6* 33.2*  MCV 86.9  < > 88.5  < > 86.2  < > 83.4 86.0 84.5  --   PLT 110*  < > 117*  < > 294  < > 276 281 257  --   < > = values in this interval not displayed. Cardiac Enzymes:  Recent Labs  09/08/15 1808 05/20/16 1254  CKTOTAL  --  615*  TROPONINI <0.03  --    BNP: Invalid input(s): POCBNP CBG:  Recent Labs  06/03/16 0702 06/03/16 1117 06/03/16 1630  GLUCAP 140* 129* 183*    Radiological Exams: Dg Chest 1 View  05/18/2016  CLINICAL DATA:  Dizziness after getting out of bed and falling. EXAM: CHEST 1 VIEW COMPARISON:  09/08/2015. FINDINGS: Normal sized heart. Small amount of linear density at the left lung base. Stable prominence of the interstitial markings, hyper expansion of the lungs and diffuse peribronchial thickening. Diffuse osteopenia. Old, healed right  humeral neck fracture. Post CABG changes. IMPRESSION: 1. Minimal linear atelectasis or scarring at the left lung base. 2. Stable changes of COPD and chronic bronchitis. Electronically Signed   By: Beckie SaltsSteven  Reid M.D.   On: 05/18/2016 11:53   Ct Head Wo Contrast  05/18/2016  CLINICAL DATA:  Status post fall, left hip pain EXAM: CT HEAD WITHOUT CONTRAST TECHNIQUE: Contiguous axial images were obtained from the base of the skull through the vertex without intravenous contrast. COMPARISON:  None. FINDINGS: There is no evidence of mass effect, midline shift, or extra-axial fluid collections. There is no evidence of a space-occupying lesion or intracranial hemorrhage. There is no evidence of a cortical-based area of acute infarction. 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 There is generalized cerebral atrophy. There is periventricular white matter low attenuation likely secondary to microangiopathy. The ventricles and sulci are appropriate for the patient's age. The basal cisterns are patent. Visualized portions of the orbits are unremarkable. There is right maxillary sinus mucosal thickening. Cerebrovascular atherosclerotic calcifications are noted. The osseous structures are unremarkable. IMPRESSION: No acute intracranial pathology. Electronically Signed   By: Elige KoHetal  Patel   On: 05/18/2016 12:16   [place] Renal  05/29/2016  CLINICAL DATA:  Acute kidney injury. EXAM: RENAL / URINARY TRACT ULTRASOUND COMPLETE COMPARISON:  05/20/2016 FINDINGS: Right Kidney: Length: 10.8 cm (stable). Stable echogenic appearance of the renal cortex and mild atrophy. No hydronephrosis. Left Kidney: Length: 11.4 cm (stable). Stable echogenic renal cortex with mild atrophy. No hydronephrosis. Stable septated renal cyst measuring approximately 6 cm in greatest dimensions. Bladder: Distended bladder contains a mild amount of dependent debris. IMPRESSION: Stable bilateral echogenic kidneys with mild atrophy. Findings are  suggestive of chronic kidney disease. No evidence of hydronephrosis. A distended bladder contains a mild amount of dependent debris. Electronically Signed   By: Irish LackGlenn  Yamagata M.D.   On: 05/29/2016 21:58   [place] Renal  05/20/2016  CLINICAL DATA:  82 year old hypertensive male with acute renal failure. Initial encounter. EXAM: RENAL / URINARY TRACT ULTRASOUND COMPLETE COMPARISON:  None. FINDINGS: Right Kidney: Length: 10.2 cm. Mild renal parenchymal thinning. Echogenic renal parenchyma. Findings may indicate changes of medical  renal disease. No hydronephrosis or mass noted. Left Kidney: Length: 10.7 cm. 7.1 x 3 x 5.6 cm bilobed cyst mid to upper aspect of the left kidney. Mild renal parenchymal thinning. Echogenic renal parenchyma. Findings may indicate changes of medical renal disease. Bladder: Foley catheter in place and bladder decompressed with evaluation therefore limited. IMPRESSION: No hydronephrosis. Mild bilateral renal parenchymal thinning and increased parenchymal echogenicity. Findings may indicate changes of medical renal disease. Large bilobed left renal cyst measuring up to 7.1 cm. Electronically Signed   By: Lacy Duverney M.D.   On: 05/20/2016 15:46   Dg Chest Portable 1 View  05/29/2016  CLINICAL DATA:  82 year old male with shortness of breath. EXAM: PORTABLE CHEST 1 VIEW COMPARISON:  Chest radiograph dated 05/27 FINDINGS: Single-view of the chest demonstrates emphysematous changes of the lungs with bibasilar linear atelectasis/scarring. There is no focal consolidation, pleural effusion, or pneumothorax. The cardiac silhouette is within normal limits. Median sternotomy wires and bypass ring is noted. There is atherosclerotic calcification of the aortic arch. Degenerative changes of the spine and shoulders. Old healed right posterior rib fractures noted. No acute fracture. IMPRESSION: No active disease. Electronically Signed   By: Elgie Collard M.D.   On: 05/29/2016 18:05   Dg C-arm 61-120  Min  05/18/2016  CLINICAL DATA:  Patient status post ORIF left femur fracture. EXAM: DG C-ARM 61-120 MIN; LEFT FEMUR 2 VIEWS COMPARISON:  Hip radiograph 05/18/2016. FINDINGS: Patient status post intra medullary rod and screw fixation of comminuted intratrochanteric left femur fracture. Improved anatomic alignment at the fracture site. Four intraoperative fluoroscopic images were submitted. IMPRESSION: Patient status post intra medullary rod fixation comminuted proximal left femur fracture. Electronically Signed   By: Annia Belt M.D.   On: 05/18/2016 18:47   Dg Hip Unilat With Pelvis 2-3 Views Left  05/18/2016  CLINICAL DATA:  Left hip pain after or getting out of bed and falling this morning. EXAM: DG HIP (WITH OR WITHOUT PELVIS) 2-3V LEFT COMPARISON:  None. FINDINGS: Comminuted left intertrochanteric fracture with varus angulation. Diffuse osteopenia. Bilateral iliac artery stents. Atheromatous arterial calcifications. IMPRESSION: Comminuted left intertrochanteric fracture with varus angulation. Electronically Signed   By: Beckie Salts M.D.   On: 05/18/2016 11:55   Dg Femur Min 2 Views Left  05/18/2016  CLINICAL DATA:  Patient status post ORIF left femur fracture. EXAM: DG C-ARM 61-120 MIN; LEFT FEMUR 2 VIEWS COMPARISON:  Hip radiograph 05/18/2016. FINDINGS: Patient status post intra medullary rod and screw fixation of comminuted intratrochanteric left femur fracture. Improved anatomic alignment at the fracture site. Four intraoperative fluoroscopic images were submitted. IMPRESSION: Patient status post intra medullary rod fixation comminuted proximal left femur fracture. Electronically Signed   By: Annia Belt M.D.   On: 05/18/2016 18:47   Dg Femur Port Min 2 Views Left  05/31/2016  CLINICAL DATA:  ORIF left femur fracture EXAM: LEFT FEMUR PORTABLE 2 VIEWS COMPARISON:  05/18/2016 FINDINGS: Internal fixation across the left femoral intertrochanteric fracture. Lesser trochanter remains displaced. Otherwise  near anatomic alignment. No hardware complicating feature. IMPRESSION: Internal fixation across the left intertrochanteric fracture. Electronically Signed   By: Charlett Nose M.D.   On: 05/31/2016 15:37    Assessment/Plan  Surgical incision redness No signs of infection at present. Continue skin care, treatment nurse to follow  Dizziness With position change. Check orthostatic vitals daily x 1 week once a day. Check bp and HR bid x 1 week. If has orthostasis, consider further work up.   Leg edema With  his diastolic chf and recent surgery. Currently on lasix 20 mg bid with kcl 20 meq daily. Change lasix to 40 mg in am and 20 mg in evening. Add ted hose to both legs and keep legs elevated at rest and monitor  Loose stool 2 episodes, per staff no watery diarrhea. Currently on colace 200 mg daily. Likely from stool softner. D/c this and have him on colace 100 mg daily as needed and monitor    Goals of care: short term rehabilitation   Labs/tests ordered: cbc, cmp  Family/ staff Communication: reviewed care plan with patient and nursing supervisor    Oneal Grout, MD Internal Medicine [facility] [address] [place], [place] [address] Cell Phone (Monday-Friday 8 am - 5 pm): [phone] On Call: [phone] and follow prompts after 5 pm and on weekends Office Phone: [phone] Office Fax: [phone]

## 2016-06-13 LAB — BASIC METABOLIC PANEL
BUN: 25 mg/dL — AB (ref 4–21)
Creatinine: 1.2 mg/dL (ref 0.6–1.3)
Glucose: 95 mg/dL
POTASSIUM: 3.7 mmol/L (ref 3.4–5.3)
Sodium: 142 mmol/L (ref 137–147)

## 2016-06-13 LAB — HEPATIC FUNCTION PANEL
ALT: 13 U/L (ref 10–40)
AST: 10 U/L — AB (ref 14–40)
Alkaline Phosphatase: 61 U/L (ref 25–125)
BILIRUBIN, TOTAL: 0.5 mg/dL

## 2016-06-13 LAB — CBC AND DIFFERENTIAL
HEMATOCRIT: 27 % — AB (ref 41–53)
HEMOGLOBIN: 8.2 g/dL — AB (ref 13.5–17.5)
PLATELETS: 187 10*3/uL (ref 150–399)
WBC: 5 10*3/mL

## 2016-06-17 LAB — BASIC METABOLIC PANEL
BUN: 21 mg/dL (ref 4–21)
CREATININE: 1.1 mg/dL (ref 0.6–1.3)
Glucose: 94 mg/dL
Potassium: 4.1 mmol/L (ref 3.4–5.3)
SODIUM: 143 mmol/L (ref 137–147)

## 2016-06-17 LAB — CBC AND DIFFERENTIAL
HEMATOCRIT: 27 % — AB (ref 41–53)
Hemoglobin: 8.6 g/dL — AB (ref 13.5–17.5)
Platelets: 187 10*3/uL (ref 150–399)
WBC: 5.1 10^3/mL

## 2016-07-11 ENCOUNTER — Non-Acute Institutional Stay (SKILLED_NURSING_FACILITY): Payer: Medicare Other | Admitting: Family

## 2016-07-11 ENCOUNTER — Encounter: Payer: Self-pay | Admitting: Family

## 2016-07-11 DIAGNOSIS — E785 Hyperlipidemia, unspecified: Secondary | ICD-10-CM

## 2016-07-11 DIAGNOSIS — R600 Localized edema: Secondary | ICD-10-CM

## 2016-07-11 DIAGNOSIS — J439 Emphysema, unspecified: Secondary | ICD-10-CM

## 2016-07-11 DIAGNOSIS — I5032 Chronic diastolic (congestive) heart failure: Secondary | ICD-10-CM

## 2016-07-11 DIAGNOSIS — I110 Hypertensive heart disease with heart failure: Secondary | ICD-10-CM

## 2016-07-11 NOTE — Progress Notes (Signed)
Patient ID: Dustin [name], male   DOB: [birth year], 82 y.o.   MRN: [id number]  Location:    [facility]  Nursing Home Room Number: 606-P Place of Service:  SNF [phone])  Provider: Richarda Blade FNP-C   PCP: Dustin Givens, MD Patient Care Team: Dustin Nam, MD as PCP - General  Extended Emergency Contact Information Primary Emergency Contact: Dustin [name] Address: [address]          [place], [place] [address] [place] Home Phone: [phone] Mobile Phone: [phone] Relation: Spouse Secondary Emergency Contact: Dustin [name] Address: [address]          [place], [place] [address] [place] of [place] Home Phone: [phone] Mobile Phone: [phone] Relation: Daughter  Code Status: Full Code  Goals of care:  Advanced Directive information Advanced Directives 06/06/2016  Does patient have an advance directive? No  Type of Advance Directive -  Does patient want to make changes to advanced directive? No - Patient declined  Would patient like information on creating an advanced directive? No - patient declined information     No Known Allergies  Chief Complaint  Patient presents with  . Medical Management of Chronic Issues    Routine Visit    HPI:  82 y.o. male seen today at [facility] for medical management of chronic issues. He is seen in his room today. He continues to work well with PT/OT. He denies any acute issues this visit. He has had no recent fall episodes or hospital admission.He continues to require continuous oxygen via nasal cannula. Facility staff reports no new concerns.    Past Medical History  Diagnosis Date  . Hyperlipidemia   . Hypertension   . Abdominal aortic aneurysm (HCC)     with endoleak per Dr. Janetta Hora, aneurysmal sac size stable as of 12/11 (7.5 x 6.1cm)  . Benign prostatic hypertrophy   . History of chickenpox   . Dysuria   . Heart disease   . Gout   . Sigmoid  diverticulosis   . Melanoma in situ [facility])     Per [facility]at[facility] 2014  . MI (myocardial infarction) (HCC)   . Coronary artery disease   . Pulmonary fibrosis (HCC)   . CHF (congestive heart failure) (HCC)   . Emphysema lung [facility])     Past Surgical History  Procedure Laterality Date  . Hernia repair  2011    Right  . Coronary artery bypass graft  1991  . Bilateral cataract surgery  2011  . Aaa stent  2008  . Left cea  1997  . Intramedullary (im) nail intertrochanteric Left 05/18/2016    Procedure: INTRAMEDULLARY (IM) NAIL INTERTROCHANTRIC;  Surgeon: Dustin Geralds, MD;  Location: [facility] OR;  Service: Orthopedics;  Laterality: Left;      reports that he quit smoking about 27 years ago. His smoking use included Cigarettes. He has a 40 pack-year smoking history. He has never used smokeless tobacco. He reports that he drinks alcohol. He reports that he does not use illicit drugs. Social History   Social History  . Marital Status: Married    Spouse Name: N/A  . Number of Children: 3  . Years of Education: N/A   Occupational History  . Retired [employer]    Social History Main Topics  . Smoking status: Former Smoker -- 1.00 packs/day for 40 years    Types: Cigarettes    Quit date: 12/23/1988  .  Smokeless tobacco: Never Used  . Alcohol Use: 0.0 oz/week    0 Standard drinks or equivalent per week     Comment: WINE 1-2 TIMES WEEKLY AND BEER OCCASSIONALLY  . Drug Use: No  . Sexual Activity: Not on file   Other Topics Concern  . Not on file   Social History Narrative   Married since 1951      Former [employer], [place]    No regular exercise.   3 children, 1 died of breast CA, another had breast CA         Functional Status Survey:    No Known Allergies  Pertinent  Health Maintenance Due  Topic Date Due  . PNA vac Low Risk Adult (2 of 2 - PCV13) 12/23/2008  . INFLUENZA VACCINE  07/23/2016    Medications:   Medication List       This list is  accurate as of: 07/11/16  3:26 PM.  Always use your most recent med list.               acetaminophen 500 MG tablet  Commonly known as:  TYLENOL  Take 1 tablet (500 mg total) by mouth every 6 (six) hours as needed for mild pain, fever or headache.     allopurinol 300 MG tablet  Commonly known as:  ZYLOPRIM  Take 300 mg by mouth daily. Reported on 05/29/2016     ascorbic acid 1000 MG tablet  Commonly known as:  VITAMIN C  Take 1,000 mg by mouth daily.     aspirin 81 MG EC tablet  Take 1 tablet (81 mg total) by mouth daily.     atorvastatin 20 MG tablet  Commonly known as:  LIPITOR  Take 1 tablet (20 mg total) by mouth daily at 6 PM.     clotrimazole-betamethasone cream  Commonly known as:  LOTRISONE  Apply 1 application topically 2 (two) times daily as needed. For itching from proteinuria     diltiazem 300 MG 24 hr capsule  Commonly known as:  TIAZAC  Take 300 mg by mouth daily. Reported on 05/29/2016     docusate sodium 100 MG capsule  Commonly known as:  COLACE  Take 100 mg by mouth daily as needed.     doxazosin 8 MG tablet  Commonly known as:  CARDURA  Take 8 mg by mouth daily. Reported on 05/29/2016     dutasteride 0.5 MG capsule  Commonly known as:  AVODART  Take 0.5 mg by mouth at bedtime. Reported on 05/29/2016     furosemide 40 MG tablet  Commonly known as:  LASIX  Take 40 mg by mouth every morning.     furosemide 20 MG tablet  Commonly known as:  LASIX  Take 20 mg by mouth every evening.     ipratropium-albuterol 0.5-2.5 (3) MG/3ML Soln  Commonly known as:  DUONEB  Take 3 mLs by nebulization every 6 (six) hours as needed.     potassium chloride SA 20 MEQ tablet  Commonly known as:  K-DUR,KLOR-CON  Take 20 mEq by mouth 2 (two) times daily.     traMADol 50 MG tablet  Commonly known as:  ULTRAM  Take one tablet by mouth every 6 hours as needed for pain     VITAMIN D3 PO  Take 1,000 Units by mouth daily. Reported on 05/29/2016     vitamin E 1000 UNIT  capsule  Take 1,000 Units by mouth daily.  Review of Systems  Constitutional: Negative for fever, chills, activity change, appetite change and fatigue.  HENT: Negative for congestion, rhinorrhea, sinus pressure, sneezing and sore throat.   Eyes: Negative.   Respiratory: Negative for cough, chest tightness and wheezing.        Oxygen 3 liters nasal cannula   Cardiovascular: Positive for leg swelling. Negative for chest pain and palpitations.  Gastrointestinal: Negative for nausea, vomiting, abdominal pain, diarrhea, constipation and abdominal distention.  Endocrine: Negative.   Genitourinary: Negative for urgency and flank pain.       Foley catheter    Musculoskeletal: Positive for gait problem.  Skin: Negative for color change, pallor and rash.  Neurological: Negative for dizziness, tremors, seizures, light-headedness and headaches.  Hematological: Does not bruise/bleed easily.  Psychiatric/Behavioral: Negative for hallucinations, confusion, sleep disturbance and agitation. The patient is not nervous/anxious.     Filed Vitals:   07/11/16 0956  BP: 156/63  Pulse: 70  Temp: 95.9 F (35.5 C)  TempSrc: Oral  Resp: 22  Height: 5\' 7"  (1.702 m)  Weight: 187 lb 6.4 oz (85.004 kg)  SpO2: 98%   Body mass index is 29.34 kg/(m^2). Physical Exam  Constitutional: He appears well-developed and well-nourished. No distress.  Elderly   HENT:  Head: Normocephalic.  Mouth/Throat: Oropharynx is clear and moist. No oropharyngeal exudate.  HOH   Eyes: Conjunctivae are normal. Pupils are equal, round, and reactive to light. Right eye exhibits no discharge. Left eye exhibits no discharge. No scleral icterus.  Neck: Normal range of motion. No JVD present. No thyromegaly present.  Cardiovascular: Normal rate, regular rhythm, normal heart sounds and intact distal pulses.  Exam reveals no gallop and no friction rub.   No murmur heard. Pulmonary/Chest: Effort normal and breath sounds normal.  No respiratory distress. He has no wheezes. He has no rales.  Respiration laboured. Oxygen 3 liters Nasal cannula.   Abdominal: Soft. Bowel sounds are normal. He exhibits no distension and no mass. There is no tenderness. There is no rebound and no guarding.  LBM 7/19 /2017  Musculoskeletal: He exhibits no tenderness.  Bilateral lower extremities 1+2  edema   Lymphadenopathy:    He has no cervical adenopathy.  Neurological: He is alert.  Skin: Skin is warm and dry. No rash noted. No erythema. No pallor.     Psychiatric: He has a normal mood and affect.    Labs reviewed: Basic Metabolic Panel:  Recent Labs  32/44/01 0226  05/23/16 0345  05/30/16 0531  05/31/16 0327 05/31/16 1622 06/01/16 0509 06/01/16 0930 06/02/16 0316 06/13/16 06/17/16  NA 140  < > 140  < > 135  135  < > 145 141 142  --  142 142 143  K 4.4  < > 4.1  < > 5.4*  5.4*  < > 3.3* 3.2* 3.3*  --  3.7 3.7 4.1  CL 108  < > 105  < > 97*  100*  < > 104 103 104  --  107  --   --   CO2 28  < > 28  < > 23  22  < > 27 27 28   --  29  --   --   GLUCOSE 108*  < > 100*  < > 167*  164*  < > 170* 190* 155*  --  171*  --   --   BUN 43*  < > 44*  < > 126*  131*  < > 122* 103* 96*  --  81* 25* 21  CREATININE 2.42*  < > 1.66*  < > 5.96*  6.25*  < > 4.32* 3.07* 2.72*  --  1.83* 1.2 1.1  CALCIUM 7.4*  < > 8.1*  < > 8.2*  8.2*  < > 8.1* 8.1* 7.9*  --  7.8*  --   --   MG 1.9  --   --   --  2.6*  --   --   --   --  2.5*  --   --   --   PHOS  --   < > 4.2  --  5.7*  6.3*  --  6.4*  --   --   --   --   --   --   < > = values in this interval not displayed. Liver Function Tests:  Recent Labs  05/20/16 0436  05/21/16 0226  05/28/16 05/29/16 1740 05/30/16 0531 05/31/16 0327 06/13/16  AST 17  --  19  --  15 20  --   --  10*  ALT 6*  --  5*  --  11 19  --   --  13  ALKPHOS 48  --  47  --  79 81  --   --  61  BILITOT 0.4  --  0.3  --   --  0.4  --   --   --   PROT 4.4*  --  4.2*  --   --  5.8*  --   --   --   ALBUMIN 2.4*  < >  2.0*  < >  --  2.4* 2.0* 1.9*  --   < > = values in this interval not displayed. No results for input(s): LIPASE, AMYLASE in the last 8760 hours. No results for input(s): AMMONIA in the last 8760 hours. CBC:  Recent Labs  05/20/16 0436  05/21/16 0226  05/29/16 1740  05/30/16 0531 05/31/16 0327 06/01/16 0509 06/02/16 0316 06/13/16 06/17/16  WBC 12.7*  < > 10.3  < > 21.2*  < > 16.5* 16.2* 23.3*  --  5.0 5.1  NEUTROABS 10.0*  --  8.2*  --  18.7*  --   --   --   --   --   --   --   HGB 7.8*  < > 8.1*  < > 10.1*  < > 7.9* 7.0* 10.0* 10.6* 8.2* 8.6*  HCT 25.1*  < > 25.3*  < > 30.7*  < > 24.1* 22.1* 30.6* 33.2* 27* 27*  MCV 86.9  < > 88.5  < > 86.2  < > 83.4 86.0 84.5  --   --   --   PLT 110*  < > 117*  < > 294  < > 276 281 257  --  187 187  < > = values in this interval not displayed. Cardiac Enzymes:  Recent Labs  09/08/15 1808 05/20/16 1254  CKTOTAL  --  615*  TROPONINI <0.03  --    BNP: Invalid input(s): POCBNP CBG:  Recent Labs  06/03/16 0702 06/03/16 1117 06/03/16 1630  GLUCAP 140* 129* 183*    Procedures and Imaging Studies During Stay: No results found.  Assessment/Plan:   1. Hypertensive heart disease with CHF (congestive heart failure) (HCC) B/p stable. Continue on current medication. CBC,Hgb A1C, TSH  and  BMP 07/12/2016.   2. Chronic diastolic congestive heart failure (HCC) Stable. Negative for cough, wheezing or rales. Bilateral + 2 edema noted. Continue on furosemide 40 mg  Tablet in AM and 20 mg PM. Add bilateral knee ted hose on in the morning and off at bedtime.   3. Pulmonary emphysema, unspecified emphysema type (HCC) Stable. Afebrile. Continue on Duonebs. Continue on oxygen via nasal cannula.   4. Hyperlipidemia Continue on Atorvastatin 20 mg Tablet. Lipid panel 07/12/2016  5. Localized edema Bilateral + 2 edema noted. Continue on furosemide 40 mg Tablet in AM and 20 mg PM. Add bilateral knee ted hose on in the morning and off at bedtime.  BMP in  AM 07/12/2016    Family Staff Communication: Reviewed plan of care with patient and facility Nurse supervisor.   Future labs/tests needed: None

## 2016-07-22 ENCOUNTER — Non-Acute Institutional Stay (SKILLED_NURSING_FACILITY): Payer: Medicare Other | Admitting: Internal Medicine

## 2016-07-22 ENCOUNTER — Encounter: Payer: Self-pay | Admitting: Internal Medicine

## 2016-07-22 DIAGNOSIS — I5032 Chronic diastolic (congestive) heart failure: Secondary | ICD-10-CM | POA: Diagnosis not present

## 2016-07-22 DIAGNOSIS — R2681 Unsteadiness on feet: Secondary | ICD-10-CM | POA: Diagnosis not present

## 2016-07-22 DIAGNOSIS — R4189 Other symptoms and signs involving cognitive functions and awareness: Secondary | ICD-10-CM

## 2016-07-22 DIAGNOSIS — S72002S Fracture of unspecified part of neck of left femur, sequela: Secondary | ICD-10-CM

## 2016-07-22 DIAGNOSIS — E46 Unspecified protein-calorie malnutrition: Secondary | ICD-10-CM | POA: Diagnosis not present

## 2016-07-22 DIAGNOSIS — J439 Emphysema, unspecified: Secondary | ICD-10-CM | POA: Diagnosis not present

## 2016-07-22 NOTE — Progress Notes (Signed)
Patient ID: Dustin [name], male   DOB: 1926/01/06, 81 y.o.   MRN: [id number]     LOCATION:   PCP: Crawford Givens, MD   Code Status: full code   Goals of care: Advanced Directive information Advanced Directives 06/06/2016  Does patient have an advance directive? No  Type of Advance Directive -  Does patient want to make changes to advanced directive? No - Patient declined  Would patient like information on creating an advanced directive? No - patient declined information  Pre-existing out of facility DNR order (yellow form or pink MOST form) -       Extended Emergency Contact Information Primary Emergency Contact: Pilger,Jane Address: [address] [place], [place] [address] [place] Home Phone: [phone] Mobile Phone: [phone] Relation: Spouse Secondary Emergency Contact: Wilson,Diane Address: [address]          [place], [place] [address] [place] of [place] Home Phone: [phone] Mobile Phone: [phone] Relation: Daughter   No Known Allergies  Chief Complaint  Patient presents with  . Acute Visit    Form fill out     HPI:  Patient is a 82 y.o. male seen today for acute visit. He has a form from [employer] affairs for housebound status that needs to be filled out. He is here undergoing rehabilitation. He is able to feed himself. He needs assistance with other ADLs. He needs 1 person assist and help of a lift to transfer him in and out of bed. He is currently on o2 that is being weaned off.   Review of Systems:  Constitutional: Negative for fever HENT: Negative for headache, congestion Eyes: Negative for blurred vision, double vision and discharge.  Respiratory: Negative for cough and wheezing. On oxygen. Has dyspnea with exertion  Cardiovascular: Negative for chest pain, palpitations. has leg swelling.  Gastrointestinal: Negative for nausea, vomiting, abdominal pain. Had bowel movement today.  Genitourinary:  Negative for flank pain.  has a foley catheter.  Musculoskeletal: Negative for fall in the facility.  Skin: Negative for itching, rash.  Neurological: Positive for dizziness. Psychiatric/Behavioral: Negative for depression.     Past Medical History:  Diagnosis Date  . Abdominal aortic aneurysm (HCC)    with endoleak per Dr. Janetta Hora, aneurysmal sac size stable as of 12/11 (7.5 x 6.1cm)  . Benign prostatic hypertrophy   . CHF (congestive heart failure) (HCC)   . Coronary artery disease   . Dysuria   . Emphysema lung (HCC)   . Gout   . Heart disease   . History of chickenpox   . Hyperlipidemia   . Hypertension   . Melanoma in situ [facility])    Per [facility] 2014  . MI (myocardial infarction) (HCC)   . Pulmonary fibrosis (HCC)   . Sigmoid diverticulosis    Past Surgical History:  Procedure Laterality Date  . AAA stent  2008  . Bilateral cataract surgery  2011  . CORONARY ARTERY BYPASS GRAFT  1991  . HERNIA REPAIR  2011   Right  . INTRAMEDULLARY (IM) NAIL INTERTROCHANTERIC Left 05/18/2016   Procedure: INTRAMEDULLARY (IM) NAIL INTERTROCHANTRIC;  Surgeon: Jodi Geralds, MD;  Location: [facility] OR;  Service: Orthopedics;  Laterality: Left;  . Left CEA  1997   Social History:   reports that he quit smoking about 27 years ago. His smoking use included Cigarettes. He has a 40.00 pack-year smoking history. He has never used smokeless tobacco. He reports  that he drinks alcohol. He reports that he does not use drugs.  Family History  Problem Relation Age of Onset  . Stroke Mother   . Heart disease Father 25    Sudden death  . Heart attack Father     Medications:   Medication List       Accurate as of 07/22/16 10:25 AM. Always use your most recent med list.          acetaminophen 500 MG tablet Commonly known as:  TYLENOL Take 1 tablet (500 mg total) by mouth every 6 (six) hours as needed for mild pain, fever or headache.   allopurinol 300 MG tablet Commonly  known as:  ZYLOPRIM Take 300 mg by mouth daily. Reported on 05/29/2016   ascorbic acid 1000 MG tablet Commonly known as:  VITAMIN C Take 1,000 mg by mouth daily.   aspirin 81 MG EC tablet Take 1 tablet (81 mg total) by mouth daily.   atorvastatin 20 MG tablet Commonly known as:  LIPITOR Take 1 tablet (20 mg total) by mouth daily at 6 PM.   clotrimazole-betamethasone cream Commonly known as:  LOTRISONE Apply 1 application topically 2 (two) times daily as needed. For itching from proteinuria   diltiazem 300 MG 24 hr capsule Commonly known as:  TIAZAC Take 300 mg by mouth daily. Reported on 05/29/2016   docusate sodium 100 MG capsule Commonly known as:  COLACE Take 100 mg by mouth daily as needed.   doxazosin 8 MG tablet Commonly known as:  CARDURA Take 8 mg by mouth daily. Reported on 05/29/2016   dutasteride 0.5 MG capsule Commonly known as:  AVODART Take 0.5 mg by mouth at bedtime. Reported on 05/29/2016   feeding supplement (PRO-STAT SUGAR FREE 64) Liqd Take 30 mLs by mouth 2 (two) times daily. Stop date 08/15/16   furosemide 40 MG tablet Commonly known as:  LASIX Take 40 mg by mouth every morning.   furosemide 20 MG tablet Commonly known as:  LASIX Take 20 mg by mouth every evening.   ipratropium-albuterol 0.5-2.5 (3) MG/3ML Soln Commonly known as:  DUONEB Take 3 mLs by nebulization every 6 (six) hours as needed.   potassium chloride SA 20 MEQ tablet Commonly known as:  K-DUR,KLOR-CON Take 20 mEq by mouth 2 (two) times daily.   traMADol 50 MG tablet Commonly known as:  ULTRAM Take one tablet by mouth every 6 hours as needed for pain   VITAMIN D3 PO Take 1,000 Units by mouth daily. Reported on 05/29/2016   vitamin E 1000 UNIT capsule Take 1,000 Units by mouth daily.       Immunizations: Immunization History  Administered Date(s) Administered  . Influenza Split 10/05/2012  . Influenza,inj,Quad PF,36+ Mos 10/13/2013, 09/27/2014, 09/20/2015  . PPD Test  05/23/2016  . Pneumococcal Polysaccharide-23 12/24/2007  . Zoster 12/23/2008     Physical Exam: Vitals:   07/22/16 1014  BP: 140/64  Pulse: 64  Resp: 20  Temp: (!) 96.9 F (36.1 C)  TempSrc: Oral  SpO2: 96%  Weight: 187 lb 6.4 oz (85 kg)  Height: 5\' 7"  (1.702 m)   Body mass index is 29.35 kg/m.   General- elderly overweight male, in no acute distress Head- normocephalic, atraumatic Nose- no nasal discharge Throat- moist mucus membrane Eyes- PERRLA, EOMI, no pallor, no icterus Neck- no cervical lymphadenopathy Cardiovascular- normal s1,s2, no murmur, 1+ leg edema Respiratory- bilateral poor air movement, no wheeze, no rhonchi, no crackles, no use of accessory muscles, on o2 Abdomen-  bowel sounds present, soft, non tender Musculoskeletal- able to move all 4 extremities, generalized weakness more to his LLE, unsteady gait, needs 1 person assist with transfer with help of standing lift, using wheelchair and walker with assistance.  Neurological- alert and oriented to person and place Skin- warm and dry Psychiatry- normal mood and affect     Labs reviewed: Basic Metabolic Panel:  Recent Labs  56/21/30 0226  05/23/16 0345  05/30/16 0531  05/31/16 0327 05/31/16 1622 06/01/16 0509 06/01/16 0930 06/02/16 0316 06/13/16 06/17/16  NA 140  < > 140  < > 135  135  < > 145 141 142  --  142 142 143  K 4.4  < > 4.1  < > 5.4*  5.4*  < > 3.3* 3.2* 3.3*  --  3.7 3.7 4.1  CL 108  < > 105  < > 97*  100*  < > 104 103 104  --  107  --   --   CO2 28  < > 28  < > 23  22  < > --  29  --   --   GLUCOSE 108*  < > 100*  < > 167*  164*  < > 170* 190* 155*  --  171*  --   --   BUN 43*  < > 44*  < > 126*  131*  < > 122* 103* 96*  --  81* 25* 21  CREATININE 2.42*  < > 1.66*  < > 5.96*  6.25*  < > 4.32* 3.07* 2.72*  --  1.83* 1.2 1.1  CALCIUM 7.4*  < > 8.1*  < > 8.2*  8.2*  < > 8.1* 8.1* 7.9*  --  7.8*  --   --   MG 1.9  --   --   --  2.6*  --   --   --   --  2.5*  --   --    --   PHOS  --   < > 4.2  --  5.7*  6.3*  --  6.4*  --   --   --   --   --   --   < > = values in this interval not displayed. Liver Function Tests:  Recent Labs  05/20/16 0436  05/21/16 0226  05/28/16 05/29/16 1740 05/30/16 0531 05/31/16 0327 06/13/16  AST 17  --  19  --  15 20  --   --  10*  ALT 6*  --  5*  --  11 19  --   --  13  ALKPHOS 48  --  47  --  79 81  --   --  61  BILITOT 0.4  --  0.3  --   --  0.4  --   --   --   PROT 4.4*  --  4.2*  --   --  5.8*  --   --   --   ALBUMIN 2.4*  < > 2.0*  < >  --  2.4* 2.0* 1.9*  --   < > = values in this interval not displayed. No results for input(s): LIPASE, AMYLASE in the last 8760 hours. No results for input(s): AMMONIA in the last 8760 hours. CBC:  Recent Labs  05/20/16 0436  05/21/16 0226  05/29/16 1740  05/30/16 0531 05/31/16 0327 06/01/16 0509 06/02/16 0316 06/13/16 06/17/16  WBC 12.7*  < > 10.3  < > 21.2*  < >  16.5* 16.2* 23.3*  --  5.0 5.1  NEUTROABS 10.0*  --  8.2*  --  18.7*  --   --   --   --   --   --   --   HGB 7.8*  < > 8.1*  < > 10.1*  < > 7.9* 7.0* 10.0* 10.6* 8.2* 8.6*  HCT 25.1*  < > 25.3*  < > 30.7*  < > 24.1* 22.1* 30.6* 33.2* 27* 27*  MCV 86.9  < > 88.5  < > 86.2  < > 83.4 86.0 84.5  --   --   --   PLT 110*  < > 117*  < > 294  < > 276 281 257  --  187 187  < > = values in this interval not displayed. Cardiac Enzymes:  Recent Labs  09/08/15 1808 05/20/16 1254  CKTOTAL  --  615*  TROPONINI <0.03  --    BNP: Invalid input(s): POCBNP CBG:  Recent Labs  06/03/16 0702 06/03/16 1117 06/03/16 1630  GLUCAP 140* 129* 183*     Assessment/Plan  Unsteady gait Continue to work with physical therapy and occupational therapy team to help with gait training and muscle strengthening exercises.fall precautions. Skin care. Encourage to be out of bed.   Left hip fracture S/p left IM nailing, has made improvement with therapy team, currently LLE WBAT. Continue tramadol 50 mg q6h prn pain  Protein  calorie malnutrition Continue prostat, being followed by RD  Cognitive impairment Need BIMS to assess his memory. To provide supportive care.   chf Monitor weight, continue lasix 40 mg daily in am and 20 mg in pm, check bmp  Copd Continue o2 and bronchodilator  Labs/tests ordered: bmp  Family/ staff Communication: reviewed care plan with patient and nursing supervisor    Oneal Grout, MD Internal Medicine [facility] [address] [place], [place] [address] Cell Phone (Monday-Friday 8 am - 5 pm): [phone] On Call: [phone] and follow prompts after 5 pm and on weekends Office Phone: [phone] Office Fax: [phone]

## 2016-07-29 ENCOUNTER — Non-Acute Institutional Stay (SKILLED_NURSING_FACILITY): Payer: Medicare Other | Admitting: Family

## 2016-07-29 DIAGNOSIS — I110 Hypertensive heart disease with heart failure: Secondary | ICD-10-CM | POA: Diagnosis not present

## 2016-07-29 DIAGNOSIS — I5032 Chronic diastolic (congestive) heart failure: Secondary | ICD-10-CM | POA: Diagnosis not present

## 2016-07-29 DIAGNOSIS — R339 Retention of urine, unspecified: Secondary | ICD-10-CM | POA: Diagnosis not present

## 2016-07-29 DIAGNOSIS — R2681 Unsteadiness on feet: Secondary | ICD-10-CM

## 2016-07-29 DIAGNOSIS — J439 Emphysema, unspecified: Secondary | ICD-10-CM

## 2016-07-29 DIAGNOSIS — L853 Xerosis cutis: Secondary | ICD-10-CM

## 2016-07-29 DIAGNOSIS — L850 Acquired ichthyosis: Secondary | ICD-10-CM

## 2016-07-29 DIAGNOSIS — E785 Hyperlipidemia, unspecified: Secondary | ICD-10-CM | POA: Diagnosis not present

## 2016-07-29 NOTE — Progress Notes (Signed)
Location:    [facility]    Place of Service:  SNF [phone])  Provider: Richarda Blade FNP-C   PCP: Crawford Givens, MD Patient Care Team: [name], MD as PCP - General  Extended Emergency Contact Information Primary Emergency Contact: [name] Address: [address]          [place], [place] [address] D[place] of [place] Home Phone: [phone] Mobile Phone: [phone] Relation: Spouse Secondary Emergency Contact: Wilson,Diane Address: [address]          [place], [place] [address] D[place] of [place] Home Phone: [phone] Mobile Phone: [phone] Relation: Daughter  Code Status: Full Code  Goals of care:  Advanced Directive information Advanced Directives 06/06/2016  Does patient have an advance directive? No  Type of Advance Directive -  Does patient want to make changes to advanced directive? No - Patient declined  Would patient like information on creating an advanced directive? No - patient declined information  Pre-existing out of facility DNR order (yellow form or pink MOST form) -    No Known Allergies  Chief Complaint  Patient presents with  . Discharge Note    HPI:  82 y.o. male seen today at [facility] for discharge home. He was here for short term rehabilitation post hospital re-admission from 05/29/16-06/03/16 with COPD exacerbation and acute renal failure. He received steroids and antibiotics with much improvement. He was also treated for pseudomonas UTI. He had acute urinary retention, required foley catheter and failed the voiding trial. Of note, patient was previously here undergoing rehabilitation post IM nailing for left hip fracture on 05/18/16 prior to this hospital admission. He is seen in his room today. He denies any acute issues. He has follow up appointment with Urology for urinary retention. He has a foley catheter to be removed by facility Nurse on 07/30/2016 at 8 Am prior to Urology  appointment to be reinserted only if patient has discomfort.He has worked well with PT/OT now stable for discharge home. He will be discharge with HH: PT/OT to continue with ROM, Exercise, gait stability and muscle strengthening. Home health services will be arranged by facility social worker prior to discharge. Prescription medication will be written x 1 month then patient to follow up with PCP in 1-2 weeks.Facility staff report no new concerns.      Past Medical History:  Diagnosis Date  . Abdominal aortic aneurysm (HCC)    with endoleak per Dr. Janetta Hora, aneurysmal sac size stable as of 12/11 (7.5 x 6.1cm)  . Benign prostatic hypertrophy   . CHF (congestive heart failure) (HCC)   . Coronary artery disease   . Dysuria   . Emphysema lung (HCC)   . Gout   . Heart disease   . History of chickenpox   . Hyperlipidemia   . Hypertension   . Melanoma in situ [facility])    Per [facility] 2014  . MI (myocardial infarction) (HCC)   . Pulmonary fibrosis (HCC)   . Sigmoid diverticulosis     Past Surgical History:  Procedure Laterality Date  . AAA stent  2008  . Bilateral cataract surgery  2011  . CORONARY ARTERY BYPASS GRAFT  1991  . HERNIA REPAIR  2011   Right  . INTRAMEDULLARY (IM) NAIL INTERTROCHANTERIC Left 05/18/2016   Procedure: INTRAMEDULLARY (IM) NAIL INTERTROCHANTRIC;  Surgeon: Jodi Geralds, MD;  Location: [facility] OR;  Service: Orthopedics;  Laterality: Left;  . Left CEA  1997  reports that he quit smoking about 27 years ago. His smoking use included Cigarettes. He has a 40.00 pack-year smoking history. He has never used smokeless tobacco. He reports that he drinks alcohol. He reports that he does not use drugs. Social History   Social History  . Marital status: Married    Spouse name: N/A  . Number of children: 3  . Years of education: N/A   Occupational History  . Retired [employer] Retired   Social History Main Topics  . Smoking status: Former Smoker     Packs/day: 1.00    Years: 40.00    Types: Cigarettes    Quit date: 12/23/1988  . Smokeless tobacco: Never Used  . Alcohol use 0.0 oz/week     Comment: WINE 1-2 TIMES WEEKLY AND BEER OCCASSIONALLY  . Drug use: No  . Sexual activity: Not on file   Other Topics Concern  . Not on file   Social History Narrative   Married since 1951      Former [employer], deployed to [place] prev    No regular exercise.   3 children, 1 died of breast CA, another had breast CA            No Known Allergies  Pertinent  Health Maintenance Due  Topic Date Due  . PNA vac Low Risk Adult (2 of 2 - PCV13) 12/23/2008  . INFLUENZA VACCINE  07/23/2016    Medications:   Medication List       Accurate as of 07/29/16  1:45 PM. Always use your most recent med list.          acetaminophen 500 MG tablet Commonly known as:  TYLENOL Take 1 tablet (500 mg total) by mouth every 6 (six) hours as needed for mild pain, fever or headache.   allopurinol 300 MG tablet Commonly known as:  ZYLOPRIM Take 300 mg by mouth daily. Reported on 05/29/2016   ascorbic acid 1000 MG tablet Commonly known as:  VITAMIN C Take 1,000 mg by mouth daily.   aspirin 81 MG EC tablet Take 1 tablet (81 mg total) by mouth daily.   atorvastatin 20 MG tablet Commonly known as:  LIPITOR Take 1 tablet (20 mg total) by mouth daily at 6 PM.   clotrimazole-betamethasone cream Commonly known as:  LOTRISONE Apply 1 application topically 2 (two) times daily as needed. For itching from proteinuria   diltiazem 300 MG 24 hr capsule Commonly known as:  TIAZAC Take 300 mg by mouth daily. Reported on 05/29/2016   docusate sodium 100 MG capsule Commonly known as:  COLACE Take 100 mg by mouth daily as needed.   doxazosin 8 MG tablet Commonly known as:  CARDURA Take 8 mg by mouth daily. Reported on 05/29/2016   dutasteride 0.5 MG capsule Commonly known as:  AVODART Take 0.5 mg by mouth at bedtime. Reported on 05/29/2016   feeding supplement  (PRO-STAT SUGAR FREE 64) Liqd Take 30 mLs by mouth 2 (two) times daily. Stop date 08/15/16   furosemide 40 MG tablet Commonly known as:  LASIX Take 40 mg by mouth every morning.   furosemide 20 MG tablet Commonly known as:  LASIX Take 20 mg by mouth every evening.   ipratropium-albuterol 0.5-2.5 (3) MG/3ML Soln Commonly known as:  DUONEB Take 3 mLs by nebulization every 6 (six) hours as needed.   potassium chloride SA 20 MEQ tablet Commonly known as:  K-DUR,KLOR-CON Take 20 mEq by mouth 2 (two) times daily.   traMADol  50 MG tablet Commonly known as:  ULTRAM Take one tablet by mouth every 6 hours as needed for pain   VITAMIN D3 PO Take 1,000 Units by mouth daily. Reported on 05/29/2016   vitamin E 1000 UNIT capsule Take 1,000 Units by mouth daily.       Review of Systems  Constitutional: Negative for activity change, appetite change, chills, fatigue and fever.  HENT: Negative for congestion, rhinorrhea, sinus pressure, sneezing and sore throat.   Eyes: Negative.   Respiratory: Negative for cough, chest tightness and wheezing.        Oxygen 3 liters nasal cannula   Cardiovascular: Negative for chest pain, palpitations and leg swelling.  Gastrointestinal: Negative for abdominal distention, abdominal pain, constipation, diarrhea, nausea and vomiting.  Endocrine: Negative.   Genitourinary: Negative for flank pain and urgency.       Foley catheter    Musculoskeletal: Positive for gait problem.  Skin: Negative for color change, pallor and rash.  Neurological: Negative for dizziness, tremors, seizures, light-headedness and headaches.  Hematological: Does not bruise/bleed easily.  Psychiatric/Behavioral: Negative for agitation, confusion, hallucinations and sleep disturbance. The patient is not nervous/anxious.     Vitals:   07/29/16 1314  BP: (!) 145/60  Pulse: 74  Resp: 18  Temp: 98 F (36.7 C)  SpO2: 96%  Weight: 190 lb 4.8 oz (86.3 kg)  Height: 5\' 7"  (1.702 m)    Body mass index is 29.81 kg/m. Physical Exam  Constitutional: He appears well-developed and well-nourished. No distress.  Elderly   HENT:  Head: Normocephalic.  Mouth/Throat: Oropharynx is clear and moist. No oropharyngeal exudate.  HOH   Eyes: Conjunctivae are normal. Pupils are equal, round, and reactive to light. Right eye exhibits no discharge. Left eye exhibits no discharge. No scleral icterus.  Neck: Normal range of motion. No JVD present. No thyromegaly present.  Cardiovascular: Normal rate, regular rhythm, normal heart sounds and intact distal pulses.  Exam reveals no gallop and no friction rub.   No murmur heard. Pulmonary/Chest: Effort normal and breath sounds normal. No respiratory distress. He has no wheezes. He has no rales.  Respiration laboured. Oxygen 3 liters Nasal cannula.   Abdominal: Soft. Bowel sounds are normal. He exhibits no distension and no mass. There is no tenderness. There is no rebound and no guarding.  LBM 7/19 /2017  Genitourinary:  Genitourinary Comments: Foley Catheter draining adequate amounts of yellow urine.   Musculoskeletal: He exhibits no edema, tenderness or deformity.  Unsteady gait. Bilateral lower extremities weakness.   Lymphadenopathy:    He has no cervical adenopathy.  Neurological: He is alert.  Skin: Skin is warm and dry. No rash noted. No erythema. No pallor.  Bilateral lower extremities skin dryness noted.    Psychiatric: He has a normal mood and affect.    Labs reviewed: Basic Metabolic Panel:  Recent Labs  91/47/82 0226  05/23/16 0345  05/30/16 0531  05/31/16 0327 05/31/16 1622 06/01/16 0509 06/01/16 0930 06/02/16 0316 06/13/16 06/17/16  NA 140  < > 140  < > 135  135  < > 145 141 142  --  142 142 143  K 4.4  < > 4.1  < > 5.4*  5.4*  < > 3.3* 3.2* 3.3*  --  3.7 3.7 4.1  CL 108  < > 105  < > 97*  100*  < > 104 103 104  --  107  --   --   CO2 28  < > 28  < >  23  22  < > 27 27 28   --  29  --   --   GLUCOSE 108*  <  > 100*  < > 167*  164*  < > 170* 190* 155*  --  171*  --   --   BUN 43*  < > 44*  < > 126*  131*  < > 122* 103* 96*  --  81* 25* 21  CREATININE 2.42*  < > 1.66*  < > 5.96*  6.25*  < > 4.32* 3.07* 2.72*  --  1.83* 1.2 1.1  CALCIUM 7.4*  < > 8.1*  < > 8.2*  8.2*  < > 8.1* 8.1* 7.9*  --  7.8*  --   --   MG 1.9  --   --   --  2.6*  --   --   --   --  2.5*  --   --   --   PHOS  --   < > 4.2  --  5.7*  6.3*  --  6.4*  --   --   --   --   --   --   < > = values in this interval not displayed. Liver Function Tests:  Recent Labs  05/20/16 0436  05/21/16 0226  05/28/16 05/29/16 1740 05/30/16 0531 05/31/16 0327 06/13/16  AST 17  --  19  --  15 20  --   --  10*  ALT 6*  --  5*  --  11 19  --   --  13  ALKPHOS 48  --  47  --  79 81  --   --  61  BILITOT 0.4  --  0.3  --   --  0.4  --   --   --   PROT 4.4*  --  4.2*  --   --  5.8*  --   --   --   ALBUMIN 2.4*  < > 2.0*  < >  --  2.4* 2.0* 1.9*  --   < > = values in this interval not displayed. No results for input(s): LIPASE, AMYLASE in the last 8760 hours. No results for input(s): AMMONIA in the last 8760 hours. CBC:  Recent Labs  05/20/16 0436  05/21/16 0226  05/29/16 1740  05/30/16 0531 05/31/16 0327 06/01/16 0509 06/02/16 0316 06/13/16 06/17/16  WBC 12.7*  < > 10.3  < > 21.2*  < > 16.5* 16.2* 23.3*  --  5.0 5.1  NEUTROABS 10.0*  --  8.2*  --  18.7*  --   --   --   --   --   --   --   HGB 7.8*  < > 8.1*  < > 10.1*  < > 7.9* 7.0* 10.0* 10.6* 8.2* 8.6*  HCT 25.1*  < > 25.3*  < > 30.7*  < > 24.1* 22.1* 30.6* 33.2* 27* 27*  MCV 86.9  < > 88.5  < > 86.2  < > 83.4 86.0 84.5  --   --   --   PLT 110*  < > 117*  < > 294  < > 276 281 257  --  187 187  < > = values in this interval not displayed. Cardiac Enzymes:  Recent Labs  09/08/15 1808 05/20/16 1254  CKTOTAL  --  615*  TROPONINI <0.03  --    BNP: Invalid input(s): POCBNP CBG:  Recent Labs  06/03/16 0702 06/03/16 1117 06/03/16 1630  GLUCAP 140* 129* 183*    Assessment/Plan:   1. Hypertensive heart disease with CHF (congestive heart failure) (HCC) B/p stable. Continue to monitor. BMP in 1-2 weeks with PCP   2. Chronic diastolic congestive heart failure (HCC) Stable. No recent weight gain. Exam findings negative for edema, wheezing or shortness of breath. Continue on Furosemide 40 mg in AM and 20 mg in the evening. Continue Potassium chloride. BMP in 1-2 weeks with PCP.   3. Pulmonary emphysema, unspecified emphysema type (HCC) Stable.Continue on Duonebs PRN. Continue on Home continuous oxygen 2 liters via Nasal cannula.   4. Urinary retention Currently has Foley Catheter. Foley Catheter to be removed by facility Nurse on 07/30/2016 at 8 Am prior to Urology appointment to be reinserted only if patient has discomfort.Continue to follow up with Urologist.   5. Hyperlipidemia Continue on Atorvastatin 20 mg Tablet.Monitor Lipid panel.   6. Unsteady gait Has worked well with PT/OT. Discharge home with PT/OT to continue with ROM, Exercise, gait stability and muscle strengthening. He does not require any DME has own. Fall and safety precautions.    7. Skin Dermatitis   Bilateral Lower extremities. Apply Aquaphor ointment twice daily until dryness resolved.    Patient is being discharged with the following home health services:    PT/OT to continue with ROM, Exercise, gait stability and muscle strengthening.  Patient is being discharged with the following durable medical equipment:   None   Patient has been advised to f/u with their PCP in 1-2 weeks to bring them up to date on their rehab stay.  Social services at facility was responsible for arranging this appointment.  Pt was provided with a 30 day supply of prescriptions for medications and refills must be obtained from their PCP.  For controlled substances, a more limited supply may be provided adequate until PCP appointment only.  Future labs/tests needed:  CBC, BMP in 1-2 weeks with PCP

## 2016-08-12 ENCOUNTER — Ambulatory Visit: Payer: Medicare Other | Admitting: Family Medicine

## 2016-10-03 ENCOUNTER — Encounter: Payer: Self-pay | Admitting: Podiatry

## 2016-10-03 ENCOUNTER — Ambulatory Visit (INDEPENDENT_AMBULATORY_CARE_PROVIDER_SITE_OTHER): Payer: Medicare Other | Admitting: Podiatry

## 2016-10-03 DIAGNOSIS — B351 Tinea unguium: Secondary | ICD-10-CM | POA: Diagnosis not present

## 2016-10-03 DIAGNOSIS — M79675 Pain in left toe(s): Secondary | ICD-10-CM | POA: Diagnosis not present

## 2016-10-03 DIAGNOSIS — M79674 Pain in right toe(s): Secondary | ICD-10-CM

## 2016-10-10 NOTE — Progress Notes (Signed)
Subjective:     Patient ID: Dustin [name], male   DOB: 03/20/1926, 82 y.o.   MRN: [id number]  HPI 82 year old male presents the office today for concerns of thick, painful, elongated toenails that he cannot trim himself. He was last seen in the office in September 28, 2013. States his daughter tried to trim his toenails however she made skin bleed. He denies any redness or drainage coming from the toenails. The areas are painful with shoe gear and pressure. No other complaints at this time.  Review of Systems  All other systems reviewed and are negative.      Objective:   Physical Exam  General: AAO x3, NAD  Dermatological: Nails are hypertrophic, dystrophic, brittle, discolored, elongated 10. No surrounding redness or drainage. Tenderness nails 1-5 bilaterally. At the distal aspect of the right fourth and fifth toes but appears to be an area for the skin previously has been cut scab is formed. There is no edema, erythema, drainage or any signs of infection the area appears to be healing. No open lesions or other pre-ulcerative lesions are identified today.   Vascular: Dorsalis Pedis artery and Posterior Tibial artery pedal pulses are 2/4 bilateral with immedate capillary fill time.  There is no pain with calf compression, swelling, warmth, erythema.   Neruologic: Grossly intact via light touch bilateral. Vibratory intact via tuning fork bilateral. Protective threshold with Semmes Wienstein monofilament intact to all pedal sites bilateral.   Musculoskeletal: No gross boney pedal deformities bilateral. No pain, crepitus, or limitation noted with foot and ankle range of motion bilateral. Muscular strength 5/5 in all groups tested bilateral.  Gait: Unassisted, Nonantalgic.      Assessment:     82 year old male symptomatic onychomycosis    Plan:     -Treatment options discussed including all alternatives, risks, and complications -Etiology of symptoms were discussed -Nails debrided 10  without complications or bleeding. -Daily foot inspection. Monitor areas and the right fourth and fifth toes. If not healed in 2-3 weeks to call the office as needed. -Follow-up in 3 months or sooner if any problems arise. In the meantime, encouraged to call the office with any questions, concerns, change in symptoms.   Ovid CurdMatthew Rayleen Wyrick, DPM

## 2017-01-07 ENCOUNTER — Ambulatory Visit (INDEPENDENT_AMBULATORY_CARE_PROVIDER_SITE_OTHER): Payer: Medicare Other | Admitting: Podiatry

## 2017-01-07 DIAGNOSIS — L603 Nail dystrophy: Secondary | ICD-10-CM

## 2017-01-07 DIAGNOSIS — L608 Other nail disorders: Secondary | ICD-10-CM

## 2017-01-07 DIAGNOSIS — M79609 Pain in unspecified limb: Secondary | ICD-10-CM | POA: Diagnosis not present

## 2017-01-07 DIAGNOSIS — B351 Tinea unguium: Secondary | ICD-10-CM | POA: Diagnosis not present

## 2017-01-12 NOTE — Progress Notes (Signed)
   SUBJECTIVE Patient  presents to office today complaining of elongated, thickened nails. Pain while ambulating in shoes. Patient is unable to trim their own nails.   OBJECTIVE General Patient is awake, alert, and oriented x 3 and in no acute distress. Derm Skin is dry and supple bilateral. Negative open lesions or macerations. Remaining integument unremarkable. Nails are tender, long, thickened and dystrophic with subungual debris, consistent with onychomycosis, 1-5 bilateral. No signs of infection noted. Vasc  DP and PT pedal pulses palpable bilaterally. Temperature gradient within normal limits.  Neuro Epicritic and protective threshold sensation diminished bilaterally.  Musculoskeletal Exam No symptomatic pedal deformities noted bilateral. Muscular strength within normal limits.  ASSESSMENT 1. Onychodystrophic nails 1-5 bilateral with hyperkeratosis of nails.  2. Onychomycosis of nail due to dermatophyte bilateral 3. Pain in foot bilateral  PLAN OF CARE 1. Patient evaluated today.  2. Instructed to maintain good pedal hygiene and foot care.  3. Mechanical debridement of nails 1-5 bilaterally performed using a nail nipper. Filed with dremel without incident.  4. Return to clinic in 3 mos.    Brent M. Evans, DPM [facility]  Dr. Brent M. Evans, DPM    [address]                                        [place], [place] [address]                Office [phone]  Fax [phone]      

## 2017-02-07 IMAGING — CR DG FEMUR 2+V PORT*L*
4 series · 4 of 4 positions shown · non-contrast
Comparison: 05/18/2016

CLINICAL DATA: ORIF left femur fracture

EXAM:
LEFT FEMUR PORTABLE 2 VIEWS

[AP (1 of 2)]
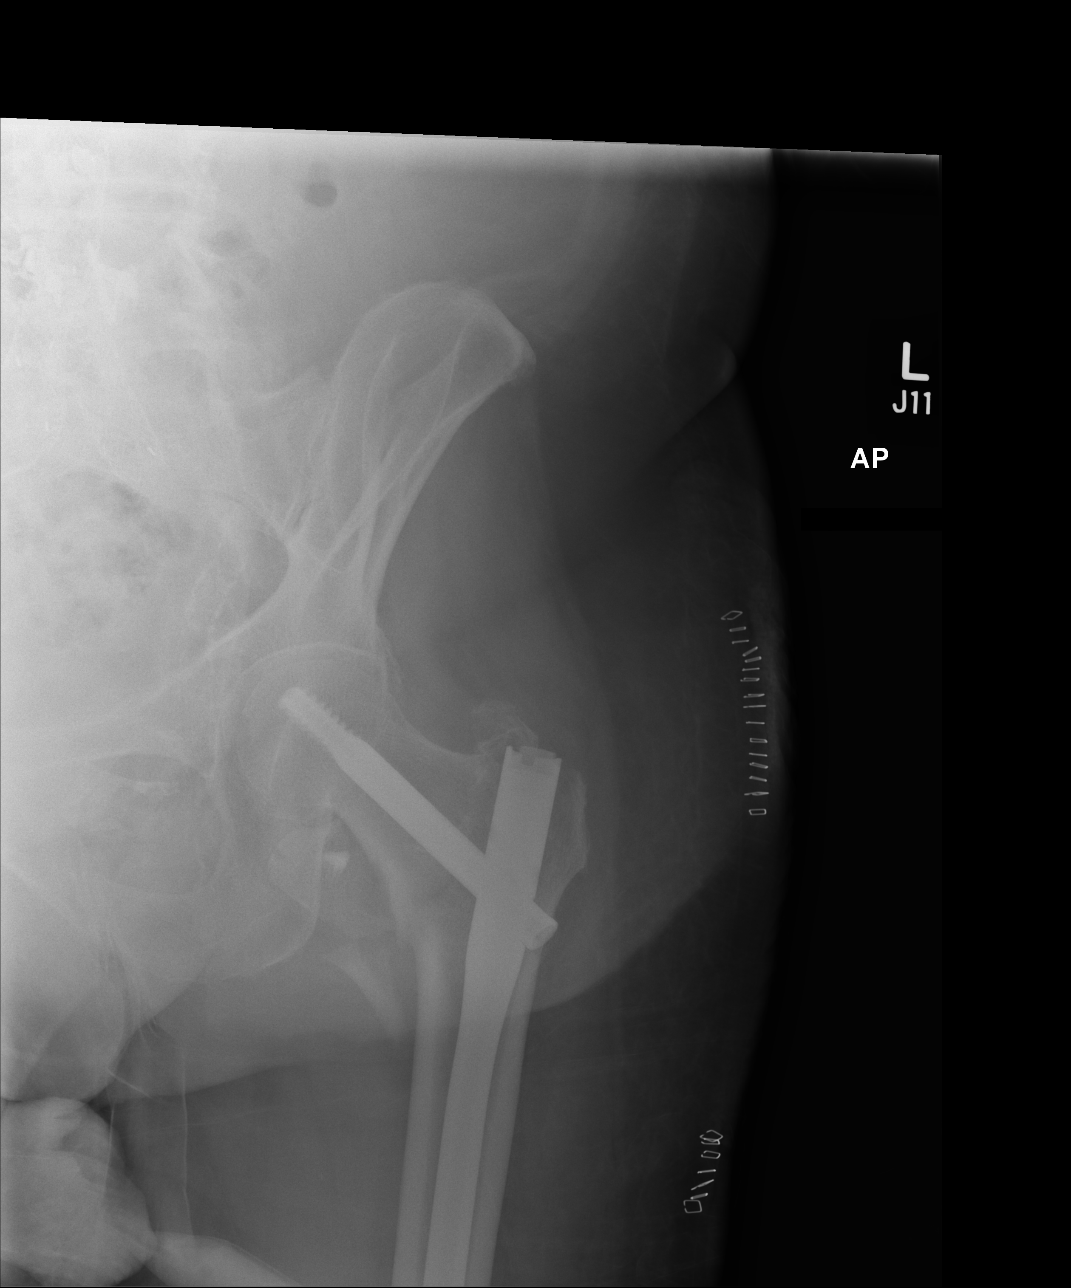

[AP (2 of 2)]
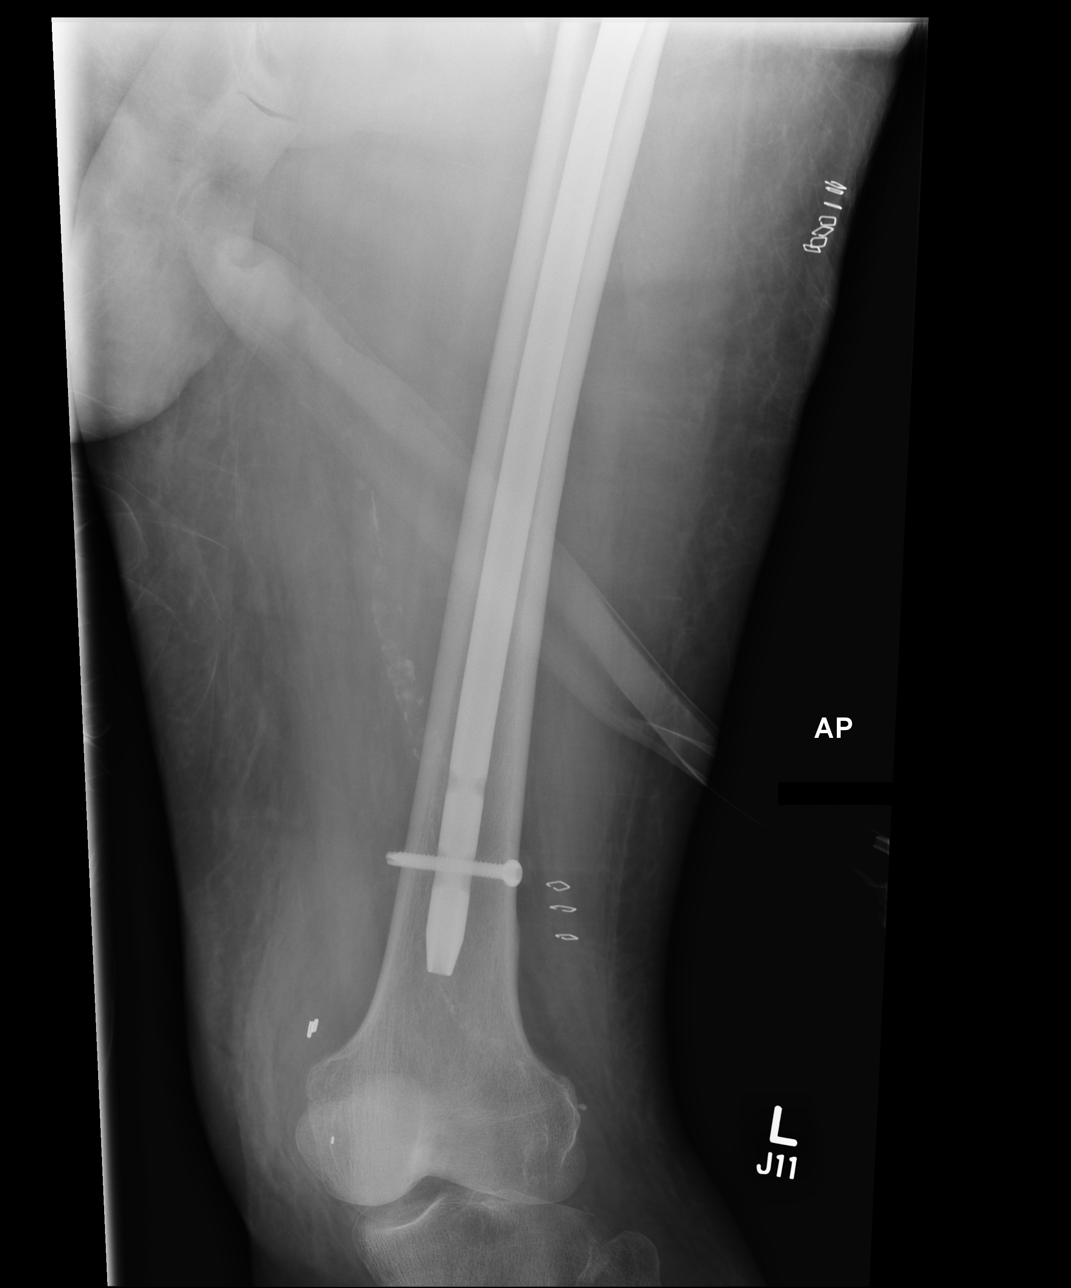

[xtable lateral (1 of 2)]
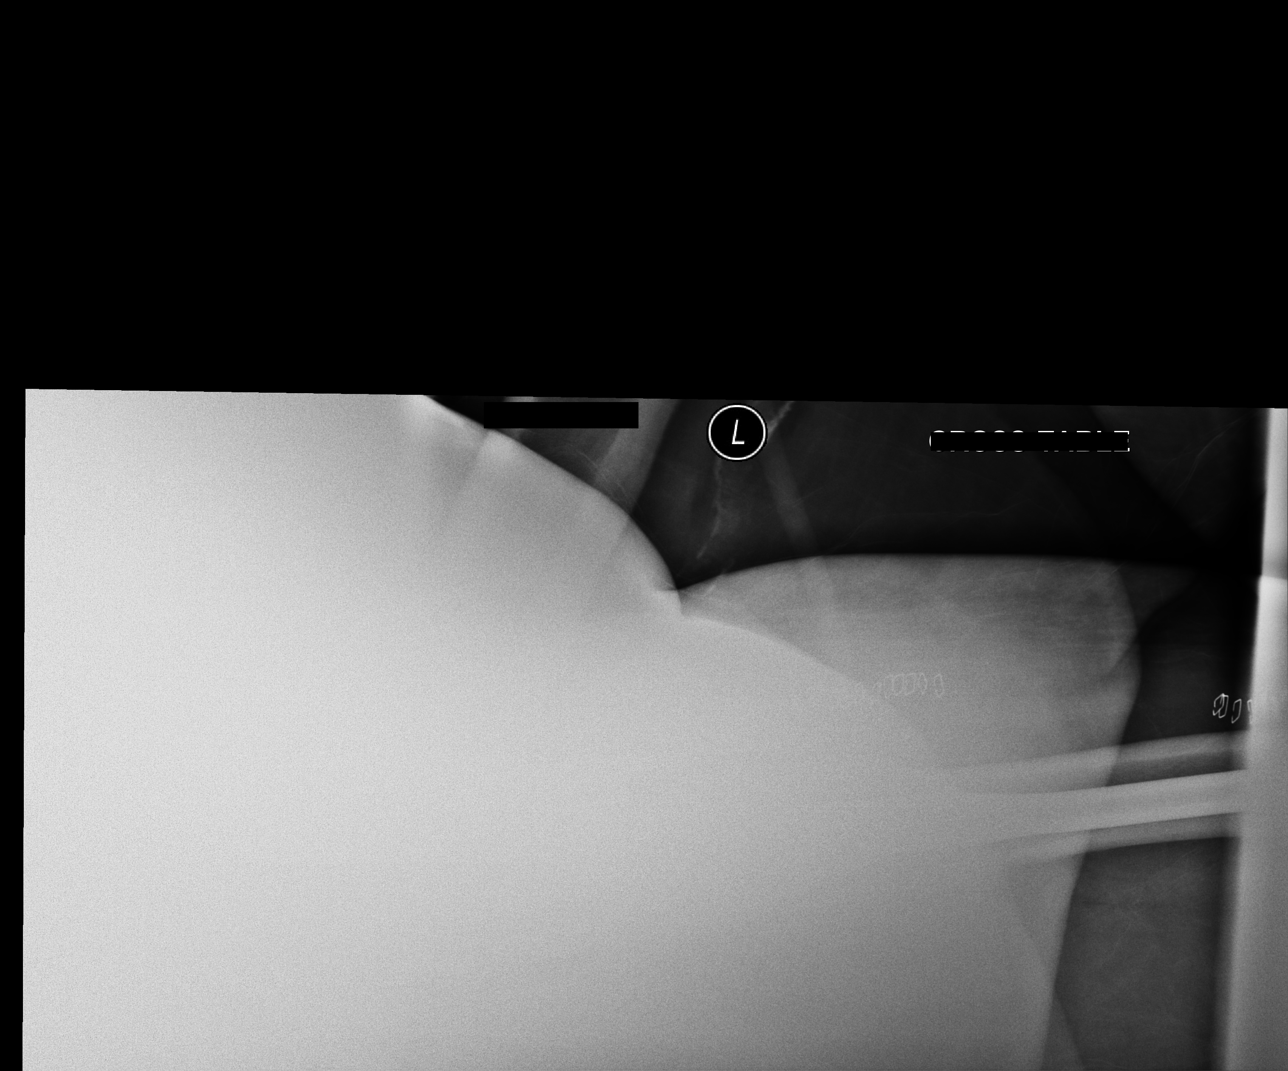

[xtable lateral (2 of 2)]
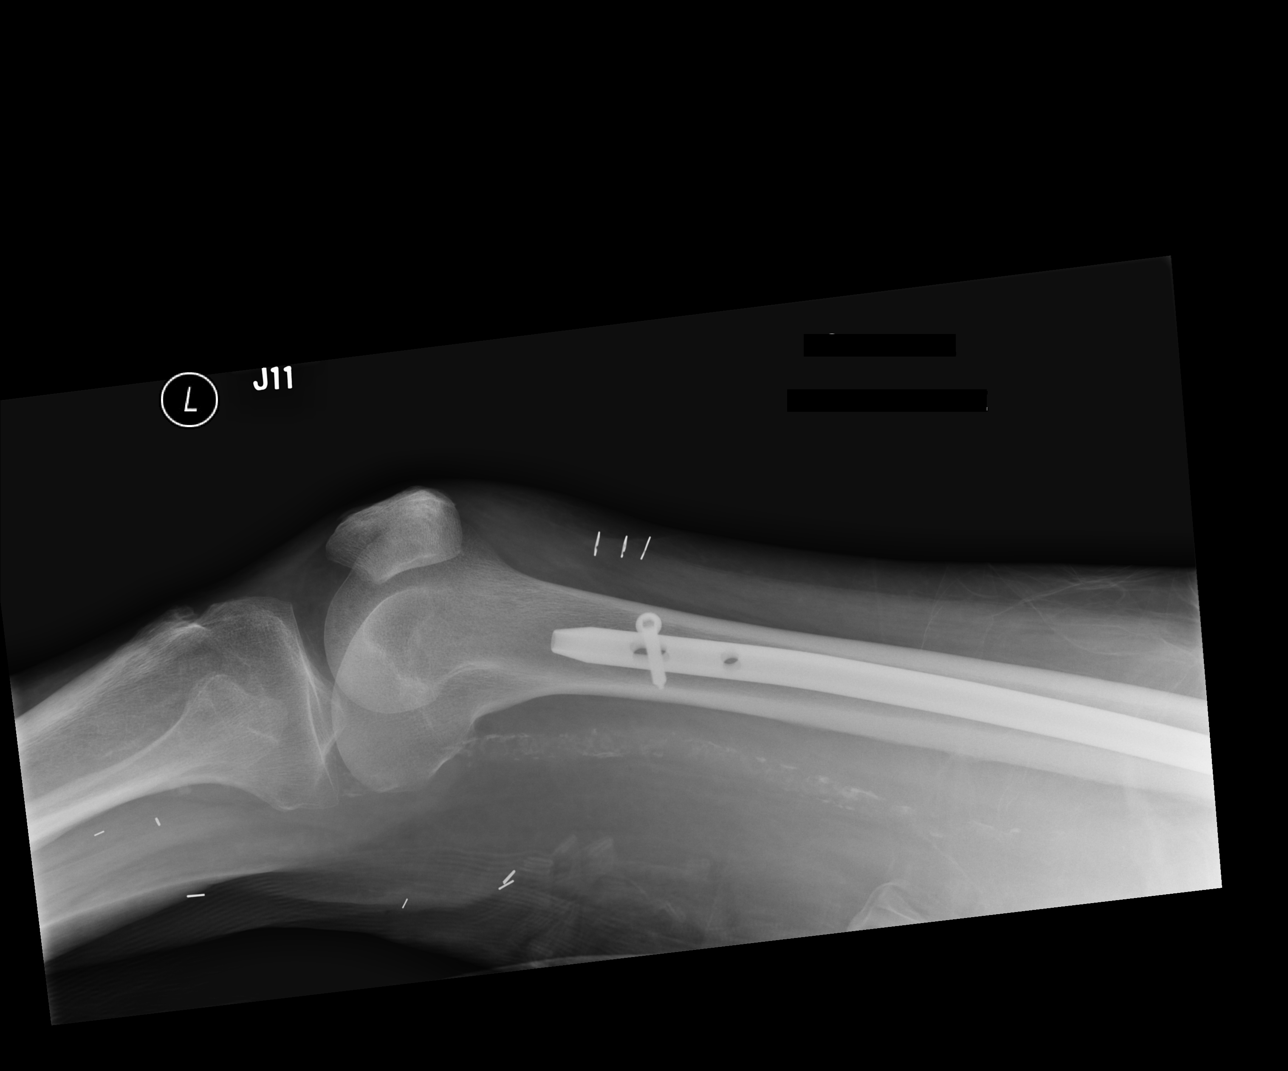

[4 of 4 positions shown; findings below may reference images not displayed]

FINDINGS: Internal fixation across the left femoral intertrochanteric
fracture. Lesser trochanter remains displaced. Otherwise near
anatomic alignment. No hardware complicating feature.
IMPRESSION: Internal fixation across the left intertrochanteric fracture.

## 2017-04-15 ENCOUNTER — Encounter: Payer: Self-pay | Admitting: Podiatry

## 2017-04-15 ENCOUNTER — Ambulatory Visit: Payer: Medicare Other | Admitting: Podiatry

## 2017-04-15 ENCOUNTER — Ambulatory Visit (INDEPENDENT_AMBULATORY_CARE_PROVIDER_SITE_OTHER): Payer: Medicare Other | Admitting: Podiatry

## 2017-04-15 DIAGNOSIS — L608 Other nail disorders: Secondary | ICD-10-CM

## 2017-04-15 DIAGNOSIS — B351 Tinea unguium: Secondary | ICD-10-CM

## 2017-04-15 DIAGNOSIS — L603 Nail dystrophy: Secondary | ICD-10-CM

## 2017-04-15 DIAGNOSIS — M79609 Pain in unspecified limb: Secondary | ICD-10-CM | POA: Diagnosis not present

## 2017-04-16 NOTE — Progress Notes (Signed)
   SUBJECTIVE Patient  presents to office today complaining of elongated, thickened nails. Pain while ambulating in shoes. Patient is unable to trim their own nails.   OBJECTIVE General Patient is awake, alert, and oriented x 3 and in no acute distress. Derm Skin is dry and supple bilateral. Negative open lesions or macerations. Remaining integument unremarkable. Nails are tender, long, thickened and dystrophic with subungual debris, consistent with onychomycosis, 1-5 bilateral. No signs of infection noted. Vasc  DP and PT pedal pulses palpable bilaterally. Temperature gradient within normal limits.  Neuro Epicritic and protective threshold sensation diminished bilaterally.  Musculoskeletal Exam No symptomatic pedal deformities noted bilateral. Muscular strength within normal limits.  ASSESSMENT 1. Onychodystrophic nails 1-5 bilateral with hyperkeratosis of nails.  2. Onychomycosis of nail due to dermatophyte bilateral 3. Pain in foot bilateral  PLAN OF CARE 1. Patient evaluated today.  2. Instructed to maintain good pedal hygiene and foot care.  3. Mechanical debridement of nails 1-5 bilaterally performed using a nail nipper. Filed with dremel without incident.  4. Return to clinic in 3 mos.    Annina Piotrowski M. Destyn Schuyler, DPM [facility]  Dr. Kannon Baum M. Jeffrey Graefe, DPM    [address]                                        [place], [place] [address]                Office [phone]  Fax [phone]      

## 2017-04-22 ENCOUNTER — Ambulatory Visit: Payer: Medicare Other | Admitting: Podiatry

## 2017-11-22 DEATH — deceased
